# Patient Record
Sex: Male | Born: 1955 | Race: White | Hispanic: No | Marital: Single | Smoking: Current some day smoker
Health system: Southern US, Community
[De-identification: ages and names within clinical notes are randomized; demographics above are authoritative.]

## PROBLEM LIST (undated history)

## (undated) DIAGNOSIS — G4700 Insomnia, unspecified: Secondary | ICD-10-CM

## (undated) DIAGNOSIS — M1990 Unspecified osteoarthritis, unspecified site: Secondary | ICD-10-CM

## (undated) DIAGNOSIS — K219 Gastro-esophageal reflux disease without esophagitis: Secondary | ICD-10-CM

## (undated) DIAGNOSIS — R112 Nausea with vomiting, unspecified: Secondary | ICD-10-CM

## (undated) DIAGNOSIS — Z95810 Presence of automatic (implantable) cardiac defibrillator: Secondary | ICD-10-CM

## (undated) DIAGNOSIS — I4891 Unspecified atrial fibrillation: Secondary | ICD-10-CM

## (undated) DIAGNOSIS — E785 Hyperlipidemia, unspecified: Secondary | ICD-10-CM

## (undated) DIAGNOSIS — I442 Atrioventricular block, complete: Secondary | ICD-10-CM

## (undated) DIAGNOSIS — R011 Cardiac murmur, unspecified: Secondary | ICD-10-CM

## (undated) DIAGNOSIS — Z9889 Other specified postprocedural states: Secondary | ICD-10-CM

## (undated) DIAGNOSIS — Z8719 Personal history of other diseases of the digestive system: Secondary | ICD-10-CM

## (undated) DIAGNOSIS — R7303 Prediabetes: Secondary | ICD-10-CM

## (undated) DIAGNOSIS — I10 Essential (primary) hypertension: Secondary | ICD-10-CM

## (undated) DIAGNOSIS — F419 Anxiety disorder, unspecified: Secondary | ICD-10-CM

## (undated) HISTORY — DX: Anxiety disorder, unspecified: F41.9

## (undated) HISTORY — PX: OTHER SURGICAL HISTORY: SHX169

## (undated) HISTORY — DX: Atrioventricular block, complete: I44.2

## (undated) HISTORY — DX: Unspecified atrial fibrillation: I48.91

## (undated) HISTORY — DX: Presence of automatic (implantable) cardiac defibrillator: Z95.810

## (undated) HISTORY — DX: Cardiac murmur, unspecified: R01.1

## (undated) HISTORY — DX: Insomnia, unspecified: G47.00

## (undated) HISTORY — PX: HIP SURGERY: SHX245

## (undated) HISTORY — PX: HERNIA REPAIR: SHX51

## (undated) HISTORY — DX: Hyperlipidemia, unspecified: E78.5

---

## 2004-11-01 HISTORY — PX: CARDIOVERSION: SHX1299

## 2005-05-21 ENCOUNTER — Ambulatory Visit (HOSPITAL_COMMUNITY): Admission: RE | Admit: 2005-05-21 | Discharge: 2005-05-21 | Payer: Self-pay | Admitting: Cardiology

## 2006-05-20 ENCOUNTER — Inpatient Hospital Stay (HOSPITAL_COMMUNITY): Admission: EM | Admit: 2006-05-20 | Discharge: 2006-05-25 | Payer: Self-pay | Admitting: Emergency Medicine

## 2006-05-20 ENCOUNTER — Encounter (INDEPENDENT_AMBULATORY_CARE_PROVIDER_SITE_OTHER): Payer: Self-pay | Admitting: Cardiology

## 2006-05-20 ENCOUNTER — Ambulatory Visit: Payer: Self-pay | Admitting: Internal Medicine

## 2006-06-16 ENCOUNTER — Ambulatory Visit: Payer: Self-pay

## 2006-10-28 ENCOUNTER — Ambulatory Visit: Payer: Self-pay | Admitting: Internal Medicine

## 2007-03-28 ENCOUNTER — Ambulatory Visit: Payer: Self-pay | Admitting: Internal Medicine

## 2007-09-17 IMAGING — CR DG CHEST 1V PORT
1 series · 1 of 1 positions shown · non-contrast
Comparison: None available.

CLINICAL DATA: Chest pain.  Shortness of breath.  Syncope.  History of cardiomyopathy. 
 PORTABLE CHEST - 1 VIEW 05/20/06:

[view not recorded]
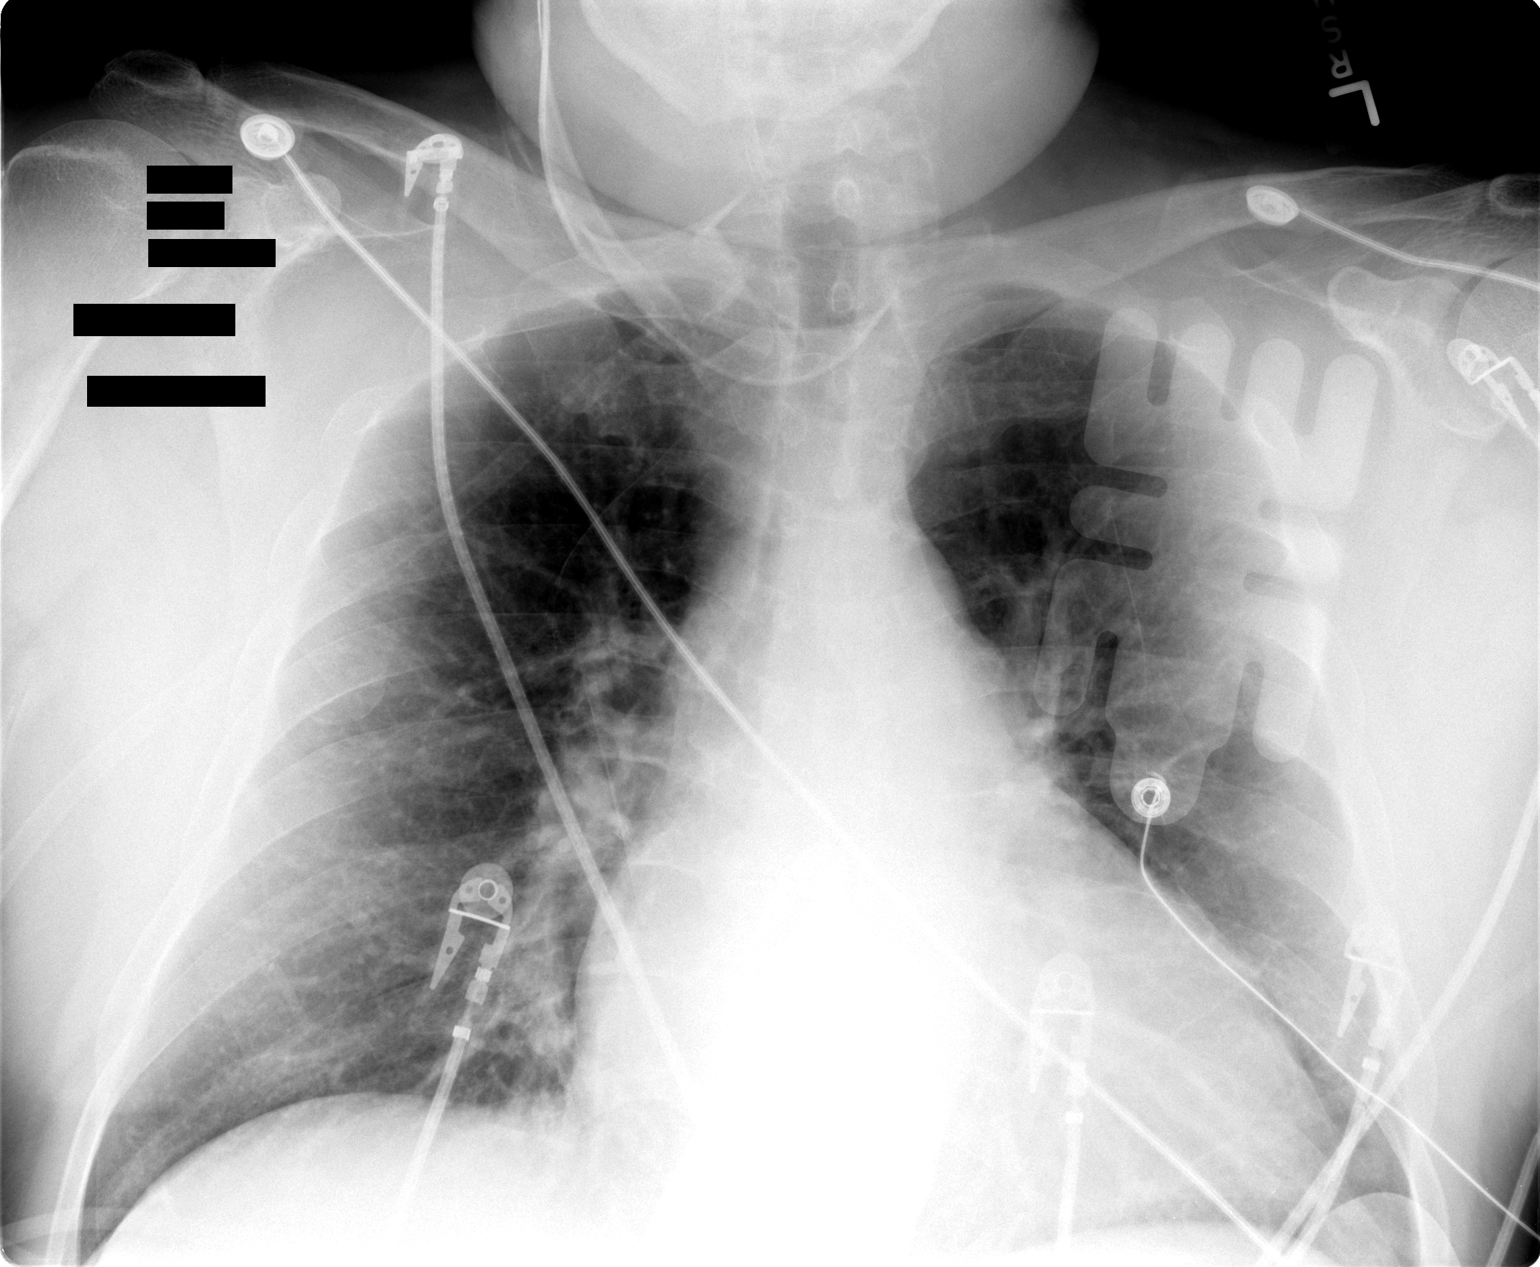

[1 of 1 positions shown; findings below may reference images not displayed]

FINDINGS: Mild to moderate cardiomegaly is seen.  Both lungs are clear. There is no evidence of pleural effusion.
IMPRESSION: Cardiomegaly.  No active lung disease.

## 2007-12-20 ENCOUNTER — Ambulatory Visit: Payer: Self-pay | Admitting: Internal Medicine

## 2007-12-20 LAB — CONVERTED CEMR LAB
Basophils Absolute: 0 10*3/uL (ref 0.0–0.1)
Basophils Relative: 0.2 % (ref 0.0–1.0)
Eosinophils Absolute: 0.3 10*3/uL (ref 0.0–0.6)
Eosinophils Relative: 3.6 % (ref 0.0–5.0)
Hemoglobin: 17 g/dL (ref 13.0–17.0)
Lymphocytes Relative: 29.4 % (ref 12.0–46.0)
MCHC: 33.7 g/dL (ref 30.0–36.0)
MCV: 96.4 fL (ref 78.0–100.0)
Magnesium: 2.1 mg/dL (ref 1.5–2.5)
Neutrophils Relative %: 55 % (ref 43.0–77.0)
RBC: 5.22 M/uL (ref 4.22–5.81)
RDW: 12.8 % (ref 11.5–14.6)
TSH: 2.62 microintl units/mL (ref 0.35–5.50)
WBC: 8.5 10*3/uL (ref 4.5–10.5)

## 2007-12-25 ENCOUNTER — Ambulatory Visit: Payer: Self-pay | Admitting: Cardiology

## 2008-01-04 ENCOUNTER — Ambulatory Visit: Payer: Self-pay | Admitting: Cardiovascular Disease

## 2008-01-12 ENCOUNTER — Ambulatory Visit: Payer: Self-pay | Admitting: Internal Medicine

## 2008-01-19 ENCOUNTER — Ambulatory Visit: Payer: Self-pay | Admitting: Cardiovascular Disease

## 2008-01-25 ENCOUNTER — Ambulatory Visit: Payer: Self-pay | Admitting: Internal Medicine

## 2008-01-26 ENCOUNTER — Ambulatory Visit: Payer: Self-pay | Admitting: Internal Medicine

## 2008-01-26 ENCOUNTER — Ambulatory Visit (HOSPITAL_COMMUNITY): Admission: RE | Admit: 2008-01-26 | Discharge: 2008-01-26 | Payer: Self-pay | Admitting: Internal Medicine

## 2008-01-31 ENCOUNTER — Ambulatory Visit: Payer: Self-pay | Admitting: Internal Medicine

## 2008-02-07 ENCOUNTER — Ambulatory Visit: Payer: Self-pay | Admitting: Cardiovascular Disease

## 2008-02-16 ENCOUNTER — Ambulatory Visit: Payer: Self-pay | Admitting: Internal Medicine

## 2008-02-23 ENCOUNTER — Ambulatory Visit: Payer: Self-pay | Admitting: Internal Medicine

## 2008-03-01 ENCOUNTER — Ambulatory Visit: Payer: Self-pay | Admitting: Internal Medicine

## 2008-03-07 ENCOUNTER — Ambulatory Visit: Payer: Self-pay | Admitting: Internal Medicine

## 2008-03-11 ENCOUNTER — Ambulatory Visit: Payer: Self-pay | Admitting: Internal Medicine

## 2008-03-11 LAB — CONVERTED CEMR LAB
BUN: 9 mg/dL (ref 6–23)
CO2: 30 meq/L (ref 19–32)
Calcium: 9.3 mg/dL (ref 8.4–10.5)
Chloride: 106 meq/L (ref 96–112)
Eosinophils Absolute: 0.3 10*3/uL (ref 0.0–0.7)
Glucose, Bld: 93 mg/dL (ref 70–99)
Lymphocytes Relative: 26.3 % (ref 12.0–46.0)
MCV: 97.4 fL (ref 78.0–100.0)
Monocytes Absolute: 0.9 10*3/uL (ref 0.1–1.0)
Monocytes Relative: 10.7 % (ref 3.0–12.0)
Neutro Abs: 5 10*3/uL (ref 1.4–7.7)
Platelets: 217 10*3/uL (ref 150–400)
Potassium: 4.1 meq/L (ref 3.5–5.1)
RDW: 13.1 % (ref 11.5–14.6)

## 2008-03-15 ENCOUNTER — Ambulatory Visit: Payer: Self-pay | Admitting: Cardiology

## 2008-03-18 ENCOUNTER — Ambulatory Visit (HOSPITAL_COMMUNITY): Admission: RE | Admit: 2008-03-18 | Discharge: 2008-03-18 | Payer: Self-pay | Admitting: Internal Medicine

## 2008-03-18 ENCOUNTER — Ambulatory Visit: Payer: Self-pay | Admitting: Internal Medicine

## 2008-04-03 ENCOUNTER — Ambulatory Visit: Payer: Self-pay | Admitting: Internal Medicine

## 2008-04-24 ENCOUNTER — Ambulatory Visit: Payer: Self-pay | Admitting: Internal Medicine

## 2008-05-02 ENCOUNTER — Ambulatory Visit: Payer: Self-pay | Admitting: Cardiology

## 2008-05-23 ENCOUNTER — Ambulatory Visit: Payer: Self-pay | Admitting: Internal Medicine

## 2008-06-03 ENCOUNTER — Ambulatory Visit: Payer: Self-pay | Admitting: Cardiology

## 2008-06-17 ENCOUNTER — Ambulatory Visit: Payer: Self-pay | Admitting: Cardiology

## 2008-07-01 ENCOUNTER — Ambulatory Visit: Payer: Self-pay | Admitting: Cardiology

## 2008-07-24 ENCOUNTER — Ambulatory Visit: Payer: Self-pay | Admitting: Cardiology

## 2008-07-27 ENCOUNTER — Emergency Department (HOSPITAL_BASED_OUTPATIENT_CLINIC_OR_DEPARTMENT_OTHER): Admission: EM | Admit: 2008-07-27 | Discharge: 2008-07-27 | Payer: Self-pay | Admitting: Internal Medicine

## 2008-07-30 ENCOUNTER — Ambulatory Visit: Payer: Self-pay | Admitting: Internal Medicine

## 2008-07-30 ENCOUNTER — Ambulatory Visit: Payer: Self-pay | Admitting: Cardiology

## 2008-08-15 ENCOUNTER — Emergency Department (HOSPITAL_BASED_OUTPATIENT_CLINIC_OR_DEPARTMENT_OTHER): Admission: EM | Admit: 2008-08-15 | Discharge: 2008-08-15 | Payer: Self-pay | Admitting: Emergency Medicine

## 2008-08-20 ENCOUNTER — Ambulatory Visit: Payer: Self-pay | Admitting: Internal Medicine

## 2008-09-17 ENCOUNTER — Ambulatory Visit: Payer: Self-pay | Admitting: Cardiology

## 2008-10-08 ENCOUNTER — Ambulatory Visit: Payer: Self-pay | Admitting: Cardiology

## 2008-10-22 ENCOUNTER — Ambulatory Visit: Payer: Self-pay | Admitting: Internal Medicine

## 2008-11-05 ENCOUNTER — Ambulatory Visit: Payer: Self-pay | Admitting: Cardiology

## 2008-11-22 ENCOUNTER — Ambulatory Visit: Payer: Self-pay | Admitting: Internal Medicine

## 2008-12-11 ENCOUNTER — Ambulatory Visit: Payer: Self-pay | Admitting: Cardiology

## 2008-12-11 DIAGNOSIS — I421 Obstructive hypertrophic cardiomyopathy: Secondary | ICD-10-CM

## 2008-12-20 ENCOUNTER — Ambulatory Visit: Payer: Self-pay | Admitting: Cardiovascular Disease

## 2009-01-07 ENCOUNTER — Ambulatory Visit: Payer: Self-pay | Admitting: Cardiology

## 2009-01-14 ENCOUNTER — Encounter: Payer: Self-pay | Admitting: Internal Medicine

## 2009-01-21 ENCOUNTER — Ambulatory Visit: Payer: Self-pay | Admitting: Cardiovascular Disease

## 2009-02-04 ENCOUNTER — Ambulatory Visit: Payer: Self-pay | Admitting: Cardiovascular Disease

## 2009-02-07 ENCOUNTER — Ambulatory Visit: Payer: Self-pay | Admitting: Cardiology

## 2009-02-12 ENCOUNTER — Telehealth (INDEPENDENT_AMBULATORY_CARE_PROVIDER_SITE_OTHER): Payer: Self-pay | Admitting: *Deleted

## 2009-02-17 ENCOUNTER — Ambulatory Visit: Payer: Self-pay | Admitting: Cardiovascular Disease

## 2009-03-04 ENCOUNTER — Ambulatory Visit: Payer: Self-pay | Admitting: Internal Medicine

## 2009-03-04 DIAGNOSIS — I442 Atrioventricular block, complete: Secondary | ICD-10-CM

## 2009-03-06 ENCOUNTER — Telehealth: Payer: Self-pay | Admitting: Internal Medicine

## 2009-03-10 ENCOUNTER — Telehealth: Payer: Self-pay | Admitting: Internal Medicine

## 2009-03-18 ENCOUNTER — Ambulatory Visit: Payer: Self-pay | Admitting: Internal Medicine

## 2009-04-01 ENCOUNTER — Encounter: Payer: Self-pay | Admitting: *Deleted

## 2009-04-08 ENCOUNTER — Ambulatory Visit: Payer: Self-pay | Admitting: Cardiology

## 2009-04-08 LAB — CONVERTED CEMR LAB: Protime: 16.7

## 2009-04-29 ENCOUNTER — Ambulatory Visit: Payer: Self-pay

## 2009-04-29 LAB — CONVERTED CEMR LAB: POC INR: 4.4

## 2009-05-07 ENCOUNTER — Encounter: Payer: Self-pay | Admitting: *Deleted

## 2009-05-26 ENCOUNTER — Ambulatory Visit: Payer: Self-pay | Admitting: Cardiology

## 2009-05-26 ENCOUNTER — Encounter (INDEPENDENT_AMBULATORY_CARE_PROVIDER_SITE_OTHER): Payer: Self-pay | Admitting: Cardiology

## 2009-05-26 LAB — CONVERTED CEMR LAB: Prothrombin Time: 21 s

## 2009-06-02 ENCOUNTER — Ambulatory Visit: Payer: Self-pay | Admitting: Internal Medicine

## 2009-06-10 ENCOUNTER — Encounter: Payer: Self-pay | Admitting: Internal Medicine

## 2009-06-21 ENCOUNTER — Emergency Department (HOSPITAL_BASED_OUTPATIENT_CLINIC_OR_DEPARTMENT_OTHER): Admission: EM | Admit: 2009-06-21 | Discharge: 2009-06-21 | Payer: Self-pay | Admitting: Emergency Medicine

## 2009-06-25 ENCOUNTER — Ambulatory Visit: Payer: Self-pay | Admitting: Internal Medicine

## 2009-07-18 ENCOUNTER — Ambulatory Visit: Payer: Self-pay | Admitting: Internal Medicine

## 2009-07-18 LAB — CONVERTED CEMR LAB: POC INR: 1.6

## 2009-08-15 ENCOUNTER — Ambulatory Visit: Payer: Self-pay | Admitting: Cardiology

## 2009-08-29 ENCOUNTER — Ambulatory Visit: Payer: Self-pay | Admitting: Radiology

## 2009-08-29 ENCOUNTER — Emergency Department (HOSPITAL_BASED_OUTPATIENT_CLINIC_OR_DEPARTMENT_OTHER): Admission: EM | Admit: 2009-08-29 | Discharge: 2009-08-29 | Payer: Self-pay | Admitting: Emergency Medicine

## 2009-08-29 ENCOUNTER — Ambulatory Visit: Payer: Self-pay | Admitting: Cardiovascular Disease

## 2009-08-29 LAB — CONVERTED CEMR LAB: POC INR: 2.5

## 2009-09-01 ENCOUNTER — Ambulatory Visit: Payer: Self-pay | Admitting: Internal Medicine

## 2009-09-05 ENCOUNTER — Telehealth: Payer: Self-pay | Admitting: Internal Medicine

## 2009-09-08 ENCOUNTER — Encounter: Payer: Self-pay | Admitting: Internal Medicine

## 2009-09-09 ENCOUNTER — Telehealth: Payer: Self-pay | Admitting: Internal Medicine

## 2009-09-23 ENCOUNTER — Ambulatory Visit: Payer: Self-pay | Admitting: Cardiovascular Disease

## 2009-09-23 LAB — CONVERTED CEMR LAB: POC INR: 4

## 2009-09-30 ENCOUNTER — Ambulatory Visit: Payer: Self-pay | Admitting: Cardiology

## 2009-09-30 ENCOUNTER — Ambulatory Visit: Payer: Self-pay | Admitting: Internal Medicine

## 2009-09-30 DIAGNOSIS — R030 Elevated blood-pressure reading, without diagnosis of hypertension: Secondary | ICD-10-CM | POA: Insufficient documentation

## 2009-10-02 ENCOUNTER — Ambulatory Visit: Payer: Self-pay

## 2009-10-02 ENCOUNTER — Ambulatory Visit: Payer: Self-pay | Admitting: Cardiology

## 2009-10-02 ENCOUNTER — Ambulatory Visit (HOSPITAL_COMMUNITY): Admission: RE | Admit: 2009-10-02 | Discharge: 2009-10-02 | Payer: Self-pay | Admitting: Internal Medicine

## 2009-10-02 ENCOUNTER — Encounter: Payer: Self-pay | Admitting: Internal Medicine

## 2009-10-07 ENCOUNTER — Inpatient Hospital Stay (HOSPITAL_COMMUNITY): Admission: RE | Admit: 2009-10-07 | Discharge: 2009-10-09 | Payer: Self-pay | Admitting: Orthopedic Surgery

## 2009-10-07 HISTORY — PX: JOINT REPLACEMENT: SHX530

## 2009-10-10 ENCOUNTER — Encounter: Payer: Self-pay | Admitting: Internal Medicine

## 2009-10-10 ENCOUNTER — Encounter: Payer: Self-pay | Admitting: Cardiology

## 2009-10-15 ENCOUNTER — Encounter: Payer: Self-pay | Admitting: Cardiology

## 2009-10-15 ENCOUNTER — Encounter (INDEPENDENT_AMBULATORY_CARE_PROVIDER_SITE_OTHER): Payer: Self-pay | Admitting: Cardiology

## 2009-10-15 LAB — CONVERTED CEMR LAB: POC INR: 1.7

## 2009-10-17 ENCOUNTER — Telehealth: Payer: Self-pay | Admitting: Internal Medicine

## 2009-10-22 ENCOUNTER — Encounter: Payer: Self-pay | Admitting: Internal Medicine

## 2009-10-22 LAB — CONVERTED CEMR LAB: POC INR: 1.6

## 2009-11-06 ENCOUNTER — Ambulatory Visit: Payer: Self-pay | Admitting: Cardiovascular Disease

## 2009-11-06 LAB — CONVERTED CEMR LAB: POC INR: 1.7

## 2009-11-20 ENCOUNTER — Ambulatory Visit: Payer: Self-pay | Admitting: Cardiology

## 2009-11-20 LAB — CONVERTED CEMR LAB: POC INR: 4.3

## 2009-12-03 ENCOUNTER — Telehealth: Payer: Self-pay | Admitting: Internal Medicine

## 2009-12-04 ENCOUNTER — Ambulatory Visit: Payer: Self-pay | Admitting: Cardiology

## 2009-12-04 LAB — CONVERTED CEMR LAB: POC INR: 3.4

## 2009-12-08 ENCOUNTER — Telehealth (INDEPENDENT_AMBULATORY_CARE_PROVIDER_SITE_OTHER): Payer: Self-pay | Admitting: *Deleted

## 2009-12-17 ENCOUNTER — Ambulatory Visit: Payer: Self-pay | Admitting: Internal Medicine

## 2009-12-31 ENCOUNTER — Ambulatory Visit: Payer: Self-pay | Admitting: Cardiology

## 2010-01-12 ENCOUNTER — Encounter: Payer: Self-pay | Admitting: Internal Medicine

## 2010-01-15 ENCOUNTER — Ambulatory Visit: Payer: Self-pay | Admitting: Internal Medicine

## 2010-01-27 ENCOUNTER — Encounter: Payer: Self-pay | Admitting: Internal Medicine

## 2010-01-28 ENCOUNTER — Ambulatory Visit: Payer: Self-pay | Admitting: Internal Medicine

## 2010-02-03 ENCOUNTER — Telehealth: Payer: Self-pay | Admitting: Internal Medicine

## 2010-02-25 ENCOUNTER — Ambulatory Visit: Payer: Self-pay | Admitting: Internal Medicine

## 2010-02-25 LAB — CONVERTED CEMR LAB: POC INR: 3.6

## 2010-03-18 ENCOUNTER — Ambulatory Visit: Payer: Self-pay | Admitting: Cardiology

## 2010-04-13 ENCOUNTER — Ambulatory Visit: Payer: Self-pay | Admitting: Internal Medicine

## 2010-04-14 ENCOUNTER — Encounter: Payer: Self-pay | Admitting: Internal Medicine

## 2010-04-15 ENCOUNTER — Ambulatory Visit: Payer: Self-pay | Admitting: Internal Medicine

## 2010-04-15 LAB — CONVERTED CEMR LAB: POC INR: 1.9

## 2010-05-13 ENCOUNTER — Ambulatory Visit: Payer: Self-pay | Admitting: Internal Medicine

## 2010-05-13 LAB — CONVERTED CEMR LAB: POC INR: 3.2

## 2010-05-15 ENCOUNTER — Encounter: Payer: Self-pay | Admitting: Internal Medicine

## 2010-06-03 ENCOUNTER — Ambulatory Visit: Payer: Self-pay | Admitting: Cardiology

## 2010-06-03 ENCOUNTER — Telehealth (INDEPENDENT_AMBULATORY_CARE_PROVIDER_SITE_OTHER): Payer: Self-pay | Admitting: *Deleted

## 2010-06-03 LAB — CONVERTED CEMR LAB: INR: 7.2

## 2010-06-10 ENCOUNTER — Ambulatory Visit: Payer: Self-pay | Admitting: Cardiology

## 2010-06-10 LAB — CONVERTED CEMR LAB: POC INR: 1.9

## 2010-07-08 ENCOUNTER — Ambulatory Visit: Payer: Self-pay | Admitting: Cardiovascular Disease

## 2010-07-08 LAB — CONVERTED CEMR LAB: POC INR: 4.2

## 2010-07-17 ENCOUNTER — Encounter: Payer: Self-pay | Admitting: Internal Medicine

## 2010-07-22 ENCOUNTER — Ambulatory Visit: Payer: Self-pay | Admitting: Cardiology

## 2010-07-22 LAB — CONVERTED CEMR LAB: POC INR: 3.8

## 2010-07-24 ENCOUNTER — Encounter: Payer: Self-pay | Admitting: Internal Medicine

## 2010-07-27 ENCOUNTER — Ambulatory Visit: Payer: Self-pay | Admitting: Internal Medicine

## 2010-08-05 ENCOUNTER — Ambulatory Visit: Payer: Self-pay | Admitting: Cardiovascular Disease

## 2010-09-02 ENCOUNTER — Ambulatory Visit: Payer: Self-pay | Admitting: Cardiology

## 2010-09-09 ENCOUNTER — Encounter: Payer: Self-pay | Admitting: Internal Medicine

## 2010-09-30 ENCOUNTER — Ambulatory Visit: Payer: Self-pay | Admitting: Internal Medicine

## 2010-10-22 ENCOUNTER — Encounter: Payer: Self-pay | Admitting: Cardiology

## 2010-10-22 ENCOUNTER — Encounter: Payer: Self-pay | Admitting: Internal Medicine

## 2010-10-22 ENCOUNTER — Ambulatory Visit: Payer: Self-pay | Admitting: Cardiology

## 2010-10-22 ENCOUNTER — Ambulatory Visit: Payer: Self-pay | Admitting: Internal Medicine

## 2010-10-22 LAB — CONVERTED CEMR LAB: POC INR: 2.9

## 2010-11-05 ENCOUNTER — Ambulatory Visit: Admit: 2010-11-05 | Payer: Self-pay | Admitting: Internal Medicine

## 2010-11-10 ENCOUNTER — Encounter (INDEPENDENT_AMBULATORY_CARE_PROVIDER_SITE_OTHER): Payer: Self-pay | Admitting: *Deleted

## 2010-11-19 ENCOUNTER — Ambulatory Visit: Admit: 2010-11-19 | Payer: Self-pay

## 2010-12-01 NOTE — Letter (Signed)
Summary: Remote Device Check  [employer], Main Office  [address]   [place], [place] [address]   Phone: [phone]  Fax: [phone]     January 27, 2010 MRN: [id number]   Charles Le [address] [place], [place]  [address]   Dear [name],   Your remote transmission was recieved and reviewed by your physician.  All diagnostics were within normal limits for you.  __X___Your next transmission is scheduled for:  April 13, 2010.  Please transmit at any time this day.  If you have a wireless device your transmission will be sent automatically.     Sincerely,  Proofreader

## 2010-12-01 NOTE — Medication Information (Signed)
Summary: rov/tm  Anticoagulant Therapy  Managed by: Bethena Midget, RN, BSN Referring MD: Linus Orn MD: Gala Romney MD, Reuel Boom Indication 1: Atrial Flutter (ICD-427.32) Lab Used: [facility] Site: [facility] INR POC 2.0 INR RANGE 2 - 3  Dietary changes: yes       Details: Appetite poor for past 10-12 days due to Bronchitis  Health status changes: yes       Details: Had Brochitis for past week, went to PCP it was viral not bacterial.   Bleeding/hemorrhagic complications: no     Any changes in medication regimen? yes       Details: Robutussin DM, Mucinex max strength  Recent/future dental: no  Any missed doses?: yes     Details: missed last Wednesdays dose  Is patient compliant with meds? yes       Allergies: No Known Drug Allergies  Anticoagulation Management History:      The patient is taking warfarin and comes in today for a routine follow up visit.  Negative risk factors for bleeding include an age less than 97 years old.  The bleeding index is 'low risk'.  Negative CHADS2 values include Age > 41 years old.  The start date was 12/26/2007.  His last INR was 12.0 ratio.  Anticoagulation responsible provider: Fady Stamps MD, Reuel Boom.  INR POC: 2.0.  Cuvette Lot#: [id number].  Exp: 03/2011.    Anticoagulation Management Assessment/Plan:      The patient's current anticoagulation dose is Warfarin sodium 5 mg tabs: Use as directed by Anticoagulation Clinic.  The target INR is 2 - 3.  The next INR is due 02/25/2010.  Anticoagulation instructions were given to patient.  Results were reviewed/authorized by Bethena Midget, RN, BSN.  He was notified by Bethena Midget, RN, BSN.         Prior Anticoagulation Instructions: INR 2.8 Continue 5mg s everyday except 7.5mg s Mondays, Wednesdays and Fridays. Recheck in 4 weeks.   Current Anticoagulation Instructions: INR 2.0 Continue 5mg s daily excecpt 7.5mg s on Mondays, Wednesdays and Fridays. Recheck in 4 weeks.

## 2010-12-01 NOTE — Letter (Signed)
Summary: Charles Le   Imported By: Marylou Mccoy 11/11/2009 14:02:14  _____________________________________________________________________  External Attachment:    Type:   Image     Comment:   External Document

## 2010-12-01 NOTE — Cardiovascular Report (Signed)
Summary: Office Visit Remote   Office Visit Remote   Imported By: Roderic Ovens 05/18/2010 12:34:07  _____________________________________________________________________  External Attachment:    Type:   Image     Comment:   External Document

## 2010-12-01 NOTE — Letter (Signed)
Summary: Remote Device Check  [employer], Main Office  [address]   [place], [place] [address]   Phone: [phone]  Fax: [phone]     May 15, 2010 MRN: [id number]   QUINTAVIUS NIEBUHR [address] [place], [place]  [address]   Dear Mr. MCFAYDEN,   Your remote transmission was recieved and reviewed by your physician.  All diagnostics were within normal limits for you.  __X___Your next transmission is scheduled for:  07-16-2010.  Please transmit at any time this day.  If you have a wireless device your transmission will be sent automatically.    Sincerely,  Vella Kohler

## 2010-12-01 NOTE — Letter (Signed)
Summary: Device-Delinquent Phone [employer], [employer], Main Office  [address]   [place], [place] [address]   Phone: [phone]  Fax: [phone]     January 12, 2010 MRN: [id number]   OGDEN HANDLIN [address] [place], [place]  [address]   Dear Mr. ELAMIN,  According to our records, you were scheduled for a device phone transmission on  January 05, 2010.     We did not receive any results from this check.  If you transmitted on your scheduled day, please call us to help troubleshoot your system.  If you forgot to send your transmission, please send one upon receipt of this letter.  Thank you,   Architectural technologist [facility]

## 2010-12-01 NOTE — Medication Information (Signed)
Summary: rov/mw  Anticoagulant Therapy  Managed by: Weston Brass, PharmD Referring MD: Linus Orn MD: Gala Romney MD, Reuel Boom Indication 1: Atrial Flutter (ICD-427.32) Lab Used: [facility] Site: [facility] INR POC 2.1 INR RANGE 2 - 3  Dietary changes: no    Health status changes: no    Bleeding/hemorrhagic complications: yes       Details: Had a couple nose bleed  ~2 weeks ago w/ just spots of blood, happened last winter  Recent/future hospitalizations: no    Any changes in medication regimen? no    Recent/future dental: no  Any missed doses?: no       Is patient compliant with meds? yes       Allergies: No Known Drug Allergies  Anticoagulation Management History:      The patient is taking warfarin and comes in today for a routine follow up visit.  Negative risk factors for bleeding include an age less than 38 years old.  The bleeding index is 'low risk'.  Negative CHADS2 values include Age > 1 years old.  The start date was 12/26/2007.  His last INR was 7.2 ratio.  Anticoagulation responsible provider: Lynora Dymond MD, Reuel Boom.  INR POC: 2.1.  Cuvette Lot#: [id number].  Exp: 10/2011.    Anticoagulation Management Assessment/Plan:      The patient's current anticoagulation dose is Warfarin sodium 5 mg tabs: Use as directed by Anticoagulation Clinic.  The target INR is 2 - 3.  The next INR is due 10/21/2010.  Anticoagulation instructions were given to patient.  Results were reviewed/authorized by Weston Brass, PharmD.  He was notified by Hoy Register, PharmD Candidate.         Prior Anticoagulation Instructions: INR 2.9 Continue taking 1 tablet everyday. Recheck in 4 weeks.  Current Anticoagulation Instructions: INR 2.1 Continue previous dose of 1 tablet everyday Recheck INR in 3 weeks

## 2010-12-01 NOTE — Letter (Signed)
Summary: Device-Delinquent Phone Journalist, newspaper, Main Office  [address]   [place], [place] [address]   Phone: [phone]  Fax: [phone]     July 17, 2010 MRN: [id number]   Charles Le [address] [place], [place]  [address]   Dear [name],  According to our records, you were scheduled for a device phone transmission on 07-16-2010.    We did not receive any results from this check.  If you transmitted on your scheduled day, please call us to help troubleshoot your system.  If you forgot to send your transmission, please send one upon receipt of this letter.  Thank you,   Architectural technologist [facility]

## 2010-12-01 NOTE — Medication Information (Signed)
Summary: ccr  Anticoagulant Therapy  Managed by: Bethena Midget, RN, BSN Referring MD: Linus Orn MD: Riley Kill MD, Maisie Fus Indication 1: Atrial Flutter (ICD-427.32) Lab Used: [facility] Site: [facility] INR POC 4.3 INR RANGE 2 - 3  Dietary changes: yes       Details: Eating less green leafy vegetables  Health status changes: no    Bleeding/hemorrhagic complications: no    Recent/future hospitalizations: no    Any changes in medication regimen? no    Recent/future dental: no  Any missed doses?: no       Is patient compliant with meds? yes       Allergies: No Known Drug Allergies  Anticoagulation Management History:      The patient is taking warfarin and comes in today for a routine follow up visit.  Negative risk factors for bleeding include an age less than 87 years old.  The bleeding index is 'low risk'.  Negative CHADS2 values include Age > 105 years old.  The start date was 12/26/2007.  His last INR was 12.0 ratio.  Anticoagulation responsible provider: Riley Kill MD, Maisie Fus.  INR POC: 4.3.  Exp: 12/2010.    Anticoagulation Management Assessment/Plan:      The patient's current anticoagulation dose is Warfarin sodium 5 mg tabs: Use as directed by Anticoagulation Clinic.  The target INR is 2 - 3.  The next INR is due 12/04/2009.  Anticoagulation instructions were given to patient.  Results were reviewed/authorized by Bethena Midget, RN, BSN.  He was notified by Bethena Midget, RN, BSN.         Prior Anticoagulation Instructions: INR 1.7  Take 2 tablets today then increase dose to 1.5 tablets every day except 1 tablet on Tuesdays and Thursdays. Recheck on 1/17.  Current Anticoagulation Instructions: INR 4.3 Skip Fridays dose then change dose to 7.5mg s everyday except 5mg s on Tuesdays, Thursdays and Saturdays. Recheck in 2 weeks.  Prescriptions: WARFARIN SODIUM 5 MG TABS (WARFARIN SODIUM) Use as directed by Anticoagulation Clinic  #120 x 1   Entered by:   Bethena Midget,  RN, BSN   Authorized by:   Nathen May, MD, [facility]   Signed by:   Bethena Midget, RN, BSN on 11/20/2009   Method used:   Electronically to        [facility] [address] (retail)       [id number]       [place], [place]  [address]       Ph: [phone]       Fax: [phone]   RxID:   [id number]

## 2010-12-01 NOTE — Letter (Signed)
Summary: Remote Device Check  [employer], Main Office  [address]   [place], [place] [address]   Phone: [phone]  Fax: [phone]     September 09, 2010 MRN: [id number]   SEICHI KAUFHOLD [address] [place], [place]  [address]   Dear Mr. MAKAREWICZ,   Your remote transmission was recieved and reviewed by your physician.  All diagnostics were within normal limits for you.  __X___Your next transmission is scheduled for: 11-05-2010.  Please transmit at any time this day.  If you have a wireless device your transmission will be sent automatically.   Sincerely,  Vella Kohler

## 2010-12-01 NOTE — Medication Information (Signed)
Summary: rov/sp  Anticoagulant Therapy  Managed by: Eda Keys, PharmD Referring MD: Linus Orn MD: Jens Som MD, Arlys John Indication 1: Atrial Flutter (ICD-427.32) Lab Used: [facility] Site: [facility] INR POC 2.4 INR RANGE 2 - 3  Dietary changes: no    Health status changes: no    Bleeding/hemorrhagic complications: no    Recent/future hospitalizations: no    Any changes in medication regimen? no    Recent/future dental: no  Any missed doses?: no       Is patient compliant with meds? yes       Allergies: No Known Drug Allergies  Anticoagulation Management History:      The patient is taking warfarin and comes in today for a routine follow up visit.  Negative risk factors for bleeding include an age less than 103 years old.  The bleeding index is 'low risk'.  Negative CHADS2 values include Age > 12 years old.  The start date was 12/26/2007.  His last INR was 12.0 ratio.  Anticoagulation responsible provider: Jens Som MD, Arlys John.  INR POC: 2.4.  Cuvette Lot#: [id number].  Exp: 06/2011.    Anticoagulation Management Assessment/Plan:      The patient's current anticoagulation dose is Warfarin sodium 5 mg tabs: Use as directed by Anticoagulation Clinic.  The target INR is 2 - 3.  The next INR is due 04/15/2010.  Anticoagulation instructions were given to patient.  Results were reviewed/authorized by Eda Keys, PharmD.  He was notified by Eda Keys.         Prior Anticoagulation Instructions: INR 3.6  Skip today's dose of Coumadin then resume same dose of 1 tablet every day except 1 1/2 tablets on Monday, Wednesday and Friday   Current Anticoagulation Instructions: INR 2.4  Continue taking 1.5 tablets on Monday, Wednesday, and Friday and 1 tablet all other dyas.  Return to clinic in 4 weeks.

## 2010-12-01 NOTE — Medication Information (Signed)
Summary: rov/sl  Anticoagulant Therapy  Managed by: Lyna Poser, PharmD Referring MD: Linus Orn MD: Riley Kill MD, Maisie Fus Indication 1: Atrial Flutter (ICD-427.32) Lab Used: [facility] Site: [facility] INR POC 2.9 INR RANGE 2 - 3  Dietary changes: no    Health status changes: no    Bleeding/hemorrhagic complications: no    Recent/future hospitalizations: no    Any changes in medication regimen? no    Recent/future dental: no  Any missed doses?: no       Is patient compliant with meds? yes       Allergies: No Known Drug Allergies  Anticoagulation Management History:      The patient is taking warfarin and comes in today for a routine follow up visit.  Negative risk factors for bleeding include an age less than 92 years old.  The bleeding index is 'low risk'.  Negative CHADS2 values include Age > 45 years old.  The start date was 12/26/2007.  His last INR was 7.2 ratio.  Anticoagulation responsible [place] First: Riley Kill MD, Maisie Fus.  INR POC: 2.9.  Cuvette Lot#: [id number].  Exp: 08/2011.    Anticoagulation Management Assessment/Plan:      The patient's current anticoagulation dose is Warfarin sodium 5 mg tabs: Use as directed by Anticoagulation Clinic.  The target INR is 2 - 3.  The next INR is due 09/30/2010.  Anticoagulation instructions were given to patient.  Results were reviewed/authorized by Lyna Poser, PharmD.         Prior Anticoagulation Instructions: INR 3.9  Today, October 5th, do not take Coumadin. Then, start taking Coumadin 1 tab (5 mg) every day.  Return to clinic in 2 weeks.    Current Anticoagulation Instructions: INR 2.9 Continue taking 1 tablet everyday. Recheck in 4 weeks.

## 2010-12-01 NOTE — Medication Information (Signed)
Summary: rov/sp  Anticoagulant Therapy  Managed by: Bethena Midget, RN, BSN Referring MD: Linus Orn MD: Jens Som MD, Arlys John Indication 1: Atrial Flutter (ICD-427.32) Lab Used: [facility] Site: [facility] INR POC 1.9 INR RANGE 2 - 3  Dietary changes: no    Health status changes: no    Bleeding/hemorrhagic complications: no    Recent/future hospitalizations: no    Any changes in medication regimen? no    Recent/future dental: no  Any missed doses?: no       Is patient compliant with meds? yes       Allergies: No Known Drug Allergies  Anticoagulation Management History:      The patient is taking warfarin and comes in today for a routine follow up visit.  Negative risk factors for bleeding include an age less than 47 years old.  The bleeding index is 'low risk'.  Negative CHADS2 values include Age > 91 years old.  The start date was 12/26/2007.  His last INR was 7.2 ratio.  Anticoagulation responsible provider: Jens Som MD, Arlys John.  INR POC: 1.9.  Cuvette Lot#: [id number].  Exp: 08/2011.    Anticoagulation Management Assessment/Plan:      The patient's current anticoagulation dose is Warfarin sodium 5 mg tabs: Use as directed by Anticoagulation Clinic.  The target INR is 2 - 3.  The next INR is due 06/24/2010.  Anticoagulation instructions were given to patient.  Results were reviewed/authorized by Bethena Midget, RN, BSN.  He was notified by Bethena Midget, RN, BSN.         Prior Anticoagulation Instructions: INR 6.7 Hold coumadin until we call.   From lab: 7.2  Skip today's dose, Thurs and Friday dose. Then change dose to 5mg s daily except 7.5mg s on Mondays and Fridays. Recheck in one week. Instructions given to Fiance'.   Current Anticoagulation Instructions: INR 1.9 Change dose to 1 pill everyday except 1.5 pills on Mondays, Wednesdays and Fridays. Recheck in 2 weeks.

## 2010-12-01 NOTE — Medication Information (Signed)
Summary: rov/tm  Anticoagulant Therapy  Managed by: Weston Brass, PharmD Referring MD: Linus Orn MD: Johney Frame MD, Fayrene Fearing Indication 1: Atrial Flutter (ICD-427.32) Lab Used: [facility] Site: [facility] INR POC 3.2 INR RANGE 2 - 3  Dietary changes: no    Health status changes: no    Bleeding/hemorrhagic complications: no    Recent/future hospitalizations: no    Any changes in medication regimen? no    Recent/future dental: no  Any missed doses?: no       Is patient compliant with meds? yes       Allergies: No Known Drug Allergies  Anticoagulation Management History:      The patient is taking warfarin and comes in today for a routine follow up visit.  Negative risk factors for bleeding include an age less than 45 years old.  The bleeding index is 'low risk'.  Negative CHADS2 values include Age > 51 years old.  The start date was 12/26/2007.  His last INR was 12.0 ratio.  Anticoagulation responsible provider: Carolena Fairbank MD, Fayrene Fearing.  INR POC: 3.2.  Cuvette Lot#: [id number].  Exp: 07/2011.    Anticoagulation Management Assessment/Plan:      The patient's current anticoagulation dose is Warfarin sodium 5 mg tabs: Use as directed by Anticoagulation Clinic.  The target INR is 2 - 3.  The next INR is due 06/03/2010.  Anticoagulation instructions were given to patient.  Results were reviewed/authorized by Weston Brass, PharmD.  He was notified by Dillard Cannon.         Prior Anticoagulation Instructions: INR 1.9 Today take 2 pills then resume 1 pill everyday except 1 1/2 pills on Mondays, Wednesdays and Fridays. Recheck in 4 weeks.   Current Anticoagulation Instructions: INR 3.2  Hold coumadin today.  Then resume 1.5 tabs on Monday, Wednesday, Friday and 1 tab on Sunday, Tuesday, Thursday, and Saturday.  Re-check INR in 3 weeks.

## 2010-12-01 NOTE — Medication Information (Signed)
Summary: CCR  Anticoagulant Therapy  Managed by: Weston Brass, PharmD Referring MD: Linus Orn MD: Juanda Chance MD, Bruce Indication 1: Atrial Flutter (ICD-427.32) Lab Used: [facility] Site: [facility] INR POC 3.8 INR RANGE 2 - 3  Dietary changes: no    Health status changes: no    Bleeding/hemorrhagic complications: no    Recent/future hospitalizations: no    Any changes in medication regimen? no    Recent/future dental: no  Any missed doses?: no       Is patient compliant with meds? yes       Allergies: No Known Drug Allergies  Anticoagulation Management History:      The patient is taking warfarin and comes in today for a routine follow up visit.  Negative risk factors for bleeding include an age less than 7 years old.  The bleeding index is 'low risk'.  Negative CHADS2 values include Age > 65 years old.  The start date was 12/26/2007.  His last INR was 7.2 ratio.  Anticoagulation responsible provider: Juanda Chance MD, Smitty Cords.  INR POC: 3.8.  Exp: 08/2011.    Anticoagulation Management Assessment/Plan:      The patient's current anticoagulation dose is Warfarin sodium 5 mg tabs: Use as directed by Anticoagulation Clinic.  The target INR is 2 - 3.  The next INR is due 08/05/2010.  Anticoagulation instructions were given to patient.  Results were reviewed/authorized by Weston Brass, PharmD.  He was notified by Weston Brass PharmD.         Prior Anticoagulation Instructions: INR 4.2  Do not take today's dose, then take one tablet every day except for one and one-half tablets on Monday and Friday.  We will see you in two weeks.      Current Anticoagulation Instructions: INR 3.8  Skip today's dose of Coumadin then decrease dose to 1 tablet every day except 1 1/2 tablets on Monday.  Recheck INR in 2 weeks.

## 2010-12-01 NOTE — Medication Information (Signed)
Summary: rov/jm  Anticoagulant Therapy  Managed by: Reina Fuse, PharmD Referring MD: Linus Orn MD: Clifton Rozelle MD, Cristal Deer Indication 1: Atrial Flutter (ICD-427.32) Lab Used: [facility] Site: [facility] INR POC 3.9 INR RANGE 2 - 3  Dietary changes: no    Health status changes: no    Bleeding/hemorrhagic complications: no    Recent/future hospitalizations: no    Any changes in medication regimen? no    Recent/future dental: no  Any missed doses?: no       Is patient compliant with meds? yes       Current Medications (verified): 1)  Atenolol 50 Mg Tabs (Atenolol) .... Take 1 Tablet By Mouth Once A Day 2)  Warfarin Sodium 5 Mg Tabs (Warfarin Sodium) .... Use As Directed By Anticoagulation Clinic 3)  Nexium 40 Mg Cpdr (Esomeprazole Magnesium) .... Take One Tablet  Once Daily 4)  Ambien 10 Mg Tabs (Zolpidem Tartrate) .... Take 1 At Bedtime. 5)  Percocet 5-325 Mg Tabs (Oxycodone-Acetaminophen) .... Take 1-2 Every 4-6 Hours. 6)  Methocarbamol 500 Mg Tabs (Methocarbamol) .... Take 1 Every 6 Hours. 7)  Alprazolam 0.5 Mg Tabs (Alprazolam) .... Take 1 Every 8 Hours.  Allergies (verified): No Known Drug Allergies  Anticoagulation Management History:      The patient is taking warfarin and comes in today for a routine follow up visit.  Negative risk factors for bleeding include an age less than 34 years old.  The bleeding index is 'low risk'.  Negative CHADS2 values include Age > 50 years old.  The start date was 12/26/2007.  His last INR was 7.2 ratio.  Anticoagulation responsible An Schnabel: Clifton Bahena MD, Cristal Deer.  INR POC: 3.9.  Cuvette Lot#: [id number].  Exp: 08/2011.    Anticoagulation Management Assessment/Plan:      The patient's current anticoagulation dose is Warfarin sodium 5 mg tabs: Use as directed by Anticoagulation Clinic.  The target INR is 2 - 3.  The next INR is due 08/26/2010.  Anticoagulation instructions were given to patient.  Results were  reviewed/authorized by Reina Fuse, PharmD.  He was notified by Reina Fuse PharmD.         Prior Anticoagulation Instructions: INR 3.8  Skip today's dose of Coumadin then decrease dose to 1 tablet every day except 1 1/2 tablets on Monday.  Recheck INR in 2 weeks.   Current Anticoagulation Instructions: INR 3.9  Today, October 5th, do not take Coumadin. Then, start taking Coumadin 1 tab (5 mg) every day.  Return to clinic in 2 weeks.

## 2010-12-01 NOTE — Cardiovascular Report (Signed)
Summary: Office Visit Remote   Office Visit Remote   Imported By: Roderic Ovens 01/29/2010 11:53:25  _____________________________________________________________________  External Attachment:    Type:   Image     Comment:   External Document

## 2010-12-01 NOTE — Progress Notes (Signed)
Summary: REFILL  Phone Note Refill Request Message from:  Patient on December 03, 2009 3:30 PM  Refills Requested: Medication #1:  NEXIUM 40 MG CPDR TAKE ONE TABLET  once daily [facility] [phone] PT IS OUT OF MEDICATION NEED TODAY.  Initial call taken by: Judie Grieve,  December 03, 2009 3:31 PM  Follow-up for Phone Call       Follow-up by: Judithe Modest CMA,  December 03, 2009 4:35 PM    Prescriptions: NEXIUM 40 MG CPDR (ESOMEPRAZOLE MAGNESIUM) TAKE ONE TABLET  once daily  #30 Each x 2   Entered by:   Judithe Modest CMA   Authorized by:   Nathen May, MD, [facility]   Signed by:   Judithe Modest CMA on 12/03/2009   Method used:   Electronically to        [facility] [address] (retail)       [phone]       [place], [place]  [address]       Ph: [phone]       Fax: [phone]   RxID:   [id number]

## 2010-12-01 NOTE — Medication Information (Signed)
Summary: rov/ln  Anticoagulant Therapy  Managed by: Reina Fuse, PharmD Referring MD: Linus Orn MD: Riley Kill MD, Maisie Fus Indication 1: Atrial Flutter (ICD-427.32) Lab Used: [facility] Site: [facility] PT 77.6 INR POC 6.7 INR RANGE 2 - 3  Dietary changes: no    Health status changes: no    Bleeding/hemorrhagic complications: no    Recent/future hospitalizations: no    Any changes in medication regimen? no    Recent/future dental: no  Any missed doses?: no       Is patient compliant with meds? yes       Current Medications (verified): 1)  Atenolol 50 Mg Tabs (Atenolol) .... Take 1 Tablet By Mouth Once A Day 2)  Warfarin Sodium 5 Mg Tabs (Warfarin Sodium) .... Use As Directed By Anticoagulation Clinic 3)  Nexium 40 Mg Cpdr (Esomeprazole Magnesium) .... Take One Tablet  Once Daily 4)  Ambien 10 Mg Tabs (Zolpidem Tartrate) .... Take 1 At Bedtime. 5)  Percocet 5-325 Mg Tabs (Oxycodone-Acetaminophen) .... Take 1-2 Every 4-6 Hours. 6)  Methocarbamol 500 Mg Tabs (Methocarbamol) .... Take 1 Every 6 Hours. 7)  Alprazolam 0.5 Mg Tabs (Alprazolam) .... Take 1 Every 8 Hours.  Allergies (verified): No Known Drug Allergies  Anticoagulation Management History:      The patient is taking warfarin and comes in today for a routine follow up visit.  Negative risk factors for bleeding include an age less than 77 years old.  The bleeding index is 'low risk'.  Negative CHADS2 values include Age > 10 years old.  The start date was 12/26/2007.  His last INR was 12.0 ratio and today's INR is 7.2.  Prothrombin time is 77.6.  Anticoagulation responsible provider: Riley Kill MD, Maisie Fus.  INR POC: 6.7.  Cuvette Lot#: [id number].  Exp: 07/2011.    Anticoagulation Management Assessment/Plan:      The patient's current anticoagulation dose is Warfarin sodium 5 mg tabs: Use as directed by Anticoagulation Clinic.  The target INR is 2 - 3.  The next INR is due 06/10/2010.  Anticoagulation instructions  were given to patient.  Results were reviewed/authorized by Reina Fuse, PharmD.  He was notified by Bethena Midget, RN, BSN.         Prior Anticoagulation Instructions: INR 3.2  Hold coumadin today.  Then resume 1.5 tabs on Monday, Wednesday, Friday and 1 tab on Sunday, Tuesday, Thursday, and Saturday.  Re-check INR in 3 weeks.  Current Anticoagulation Instructions: INR 6.7 Hold coumadin until we call.   From lab: 7.2  Skip today's dose, Thurs and Friday dose. Then change dose to 5mg s daily except 7.5mg s on Mondays and Fridays. Recheck in one week. Instructions given to Fiance'.

## 2010-12-01 NOTE — Medication Information (Signed)
Summary: rov/tm  Anticoagulant Therapy  Managed by: Weston Brass, PharmD Referring MD: Linus Orn MD: Gala Romney MD, Reuel Boom Indication 1: Atrial Flutter (ICD-427.32) Lab Used: [facility] Site: [facility] INR POC 3.6 INR RANGE 2 - 3  Dietary changes: no    Health status changes: no    Bleeding/hemorrhagic complications: no    Recent/future hospitalizations: no    Any changes in medication regimen? no    Recent/future dental: no  Any missed doses?: no       Is patient compliant with meds? yes       Allergies: No Known Drug Allergies  Anticoagulation Management History:      The patient is taking warfarin and comes in today for a routine follow up visit.  Negative risk factors for bleeding include an age less than 46 years old.  The bleeding index is 'low risk'.  Negative CHADS2 values include Age > 98 years old.  The start date was 12/26/2007.  His last INR was 12.0 ratio.  Anticoagulation responsible provider: Bensimhon MD, Reuel Boom.  INR POC: 3.6.  Cuvette Lot#: [id number].  Exp: 04/2011.    Anticoagulation Management Assessment/Plan:      The patient's current anticoagulation dose is Warfarin sodium 5 mg tabs: Use as directed by Anticoagulation Clinic.  The target INR is 2 - 3.  The next INR is due 03/18/2010.  Anticoagulation instructions were given to patient.  Results were reviewed/authorized by Weston Brass, PharmD.  He was notified by Weston Brass PharmD.         Prior Anticoagulation Instructions: INR 2.0 Continue 5mg s daily excecpt 7.5mg s on Mondays, Wednesdays and Fridays. Recheck in 4 weeks.   Current Anticoagulation Instructions: INR 3.6  Skip today's dose of Coumadin then resume same dose of 1 tablet every day except 1 1/2 tablets on Monday, Wednesday and Friday

## 2010-12-01 NOTE — Medication Information (Signed)
Summary: rov/ewj  Anticoagulant Therapy  Managed by: Bethena Midget, RN, BSN Referring MD: Linus Orn MD: Johney Frame MD, Fayrene Fearing Indication 1: Atrial Flutter (ICD-427.32) Lab Used: [facility] Site: [facility] INR POC 2.2 INR RANGE 2 - 3  Dietary changes: no    Health status changes: no    Bleeding/hemorrhagic complications: no    Recent/future hospitalizations: no    Any changes in medication regimen? no    Recent/future dental: no  Any missed doses?: no       Is patient compliant with meds? yes       Allergies: No Known Drug Allergies  Anticoagulation Management History:      The patient is taking warfarin and comes in today for a routine follow up visit.  Negative risk factors for bleeding include an age less than 39 years old.  The bleeding index is 'low risk'.  Negative CHADS2 values include Age > 24 years old.  The start date was 12/26/2007.  His last INR was 12.0 ratio.  Anticoagulation responsible provider: Michio Thier MD, Fayrene Fearing.  INR POC: 2.2.  Cuvette Lot#: [id number].  Exp: 01/2011.    Anticoagulation Management Assessment/Plan:      The patient's current anticoagulation dose is Warfarin sodium 5 mg tabs: Use as directed by Anticoagulation Clinic.  The target INR is 2 - 3.  The next INR is due 12/31/2009.  Anticoagulation instructions were given to patient.  Results were reviewed/authorized by Bethena Midget, RN, BSN.  He was notified by Bethena Midget, RN, BSN.         Prior Anticoagulation Instructions: INR 3.4  Skip today's dose of coumadin then start taking 1 tablet daily except 1.5 tablets on Mondays, Wednesdays, and Fridays.  Recheck in  2 weeks.    Current Anticoagulation Instructions: INR 2.2 Continue 5mg s everyday except 7.5mg s on Mondays, Wednesdays and Fridays. Recheck in 2 weeks.

## 2010-12-01 NOTE — Progress Notes (Signed)
Summary: questions about what pt can eat  Phone Note Call from Patient Call back at Home Phone [phone]   Caller: Spouse/ lisa Summary of Call: Questions about what pt can eat for as Greens Initial call taken by: Judie Grieve,  June 03, 2010 3:28 PM  Follow-up for Phone Call        Vitamin K diet teaching done. Fiance' verbalizes that she now understands the importance of consistency.  Follow-up by: Bethena Midget, RN, BSN,  June 03, 2010 3:41 PM

## 2010-12-01 NOTE — Medication Information (Signed)
Summary: rov/tm  Anticoagulant Therapy  Managed by: Leota Sauers, PharmD Referring MD: Linus Orn MD: Excell Seltzer MD, Casimiro Needle Indication 1: Atrial Flutter (ICD-427.32) Lab Used: [facility] Site: [facility] INR POC 1.7 INR RANGE 2 - 3  Dietary changes: yes       Details: Ate extra greens on New Year's Day.  Health status changes: no    Bleeding/hemorrhagic complications: no    Recent/future hospitalizations: no     Recent/future dental: no  Any missed doses?: no       Is patient compliant with meds? yes       Current Medications (verified): 1)  Atenolol 50 Mg Tabs (Atenolol) .... Take 1 Tablet By Mouth Once A Day 2)  Warfarin Sodium 5 Mg Tabs (Warfarin Sodium) .... Use As Directed By Anticoagulation Clinic 3)  Nexium 40 Mg Cpdr (Esomeprazole Magnesium) .... Take One Tablet  Once Daily 4)  Ambien 10 Mg Tabs (Zolpidem Tartrate) .... Take 1 At Bedtime. 5)  Percocet 5-325 Mg Tabs (Oxycodone-Acetaminophen) .... Take 1-2 Every 4-6 Hours. 6)  Methocarbamol 500 Mg Tabs (Methocarbamol) .... Take 1 Every 6 Hours. 7)  Alprazolam 0.5 Mg Tabs (Alprazolam) .... Take 1 Every 8 Hours.  Allergies (verified): No Known Drug Allergies  Anticoagulation Management History:      The patient is taking warfarin and comes in today for a routine follow up visit.  Negative risk factors for bleeding include an age less than 44 years old.  The bleeding index is 'low risk'.  Negative CHADS2 values include Age > 57 years old.  The start date was 12/26/2007.  His last INR was 12.0 ratio.  Anticoagulation responsible provider: Excell Seltzer MD, Casimiro Needle.  INR POC: 1.7.  Cuvette Lot#: [id number].  Exp: 12/2010.    Anticoagulation Management Assessment/Plan:      The patient's current anticoagulation dose is Warfarin sodium 5 mg tabs: Use as directed by Anticoagulation Clinic.  The target INR is 2 - 3.  The next INR is due 11/17/2009.  Anticoagulation instructions were given to patient.  Results were  reviewed/authorized by Leota Sauers, PharmD.  He was notified by Lew Dawes, PharmD Candidate.         Prior Anticoagulation Instructions: LMOM Bethena Midget, RN, BSN  October 22, 2009 3:42 PM  Called spoke with pt.  Advised to take an extra 1/2 tablet today then start taking 1.5 tablets daily except 1 tablet on Sundays, Tuesdays, and Thursdays.  Recheck in 10-14 days.  Called spoke with Elnita Maxwell at [facility] gave dosage instructs and recheck orders.      Current Anticoagulation Instructions: INR 1.7  Take 2 tablets today then increase dose to 1.5 tablets every day except 1 tablet on Tuesdays and Thursdays. Recheck on 1/17.

## 2010-12-01 NOTE — Medication Information (Signed)
Summary: rov/ewj  Anticoagulant Therapy  Managed by: Cloyde Reams, RN, BSN Referring MD: Linus Orn MD: Juanda Chance MD, Dayane Hillenburg Indication 1: Atrial Flutter (ICD-427.32) Lab Used: [facility] Site: [facility] INR POC 3.4 INR RANGE 2 - 3  Dietary changes: no    Health status changes: no    Bleeding/hemorrhagic complications: no    Recent/future hospitalizations: no    Any changes in medication regimen? no    Recent/future dental: no  Any missed doses?: no       Is patient compliant with meds? yes       Allergies (verified): No Known Drug Allergies  Anticoagulation Management History:      The patient is taking warfarin and comes in today for a routine follow up visit.  Negative risk factors for bleeding include an age less than 32 years old.  The bleeding index is 'low risk'.  Negative CHADS2 values include Age > 46 years old.  The start date was 12/26/2007.  His last INR was 12.0 ratio.  Anticoagulation responsible provider: Juanda Chance MD, Smitty Cords.  INR POC: 3.4.  Cuvette Lot#: [id number].  Exp: 01/2011.    Anticoagulation Management Assessment/Plan:      The patient's current anticoagulation dose is Warfarin sodium 5 mg tabs: Use as directed by Anticoagulation Clinic.  The target INR is 2 - 3.  The next INR is due 12/18/2009.  Anticoagulation instructions were given to patient.  Results were reviewed/authorized by Cloyde Reams, RN, BSN.  He was notified by Cloyde Reams RN.         Prior Anticoagulation Instructions: INR 4.3 Skip Fridays dose then change dose to 7.5mg s everyday except 5mg s on Tuesdays, Thursdays and Saturdays. Recheck in 2 weeks.   Current Anticoagulation Instructions: INR 3.4  Skip today's dose of coumadin then start taking 1 tablet daily except 1.5 tablets on Mondays, Wednesdays, and Fridays.  Recheck in  2 weeks.

## 2010-12-01 NOTE — Medication Information (Signed)
Summary: rov/tm  Anticoagulant Therapy  Managed by: Bethena Midget, RN, BSN Referring MD: Linus Orn MD: Clifton Ashenfelter MD, Cristal Deer Indication 1: Atrial Flutter (ICD-427.32) Lab Used: [facility] Site: [facility] INR POC 4.2 INR RANGE 2 - 3  Dietary changes: no    Health status changes: no    Bleeding/hemorrhagic complications: no    Recent/future hospitalizations: no    Any changes in medication regimen? no    Recent/future dental: no  Any missed doses?: no       Is patient compliant with meds? yes       Allergies: No Known Drug Allergies  Anticoagulation Management History:      Negative risk factors for bleeding include an age less than 75 years old.  The bleeding index is 'low risk'.  Negative CHADS2 values include Age > 54 years old.  The start date was 12/26/2007.  His last INR was 7.2 ratio.  Anticoagulation responsible provider: Clifton Elza MD, Cristal Deer.  INR POC: 4.2.  Cuvette Lot#: [id number].  Exp: 08/2011.    Anticoagulation Management Assessment/Plan:      The patient's current anticoagulation dose is Warfarin sodium 5 mg tabs: Use as directed by Anticoagulation Clinic.  The target INR is 2 - 3.  The next INR is due 07/22/2010.  Anticoagulation instructions were given to patient.  Results were reviewed/authorized by Bethena Midget, RN, BSN.  He was notified by Kennieth Francois.         Prior Anticoagulation Instructions: INR 1.9 Change dose to 1 pill everyday except 1.5 pills on Mondays, Wednesdays and Fridays. Recheck in 2 weeks.   Current Anticoagulation Instructions: INR 4.2  Do not take today's dose, then take one tablet every day except for one and one-half tablets on Monday and Friday.  We will see you in two weeks.

## 2010-12-01 NOTE — Cardiovascular Report (Signed)
Summary: Office Visit Remote   Office Visit Remote   Imported By: Roderic Ovens 09/10/2010 15:47:14  _____________________________________________________________________  External Attachment:    Type:   Image     Comment:   External Document

## 2010-12-01 NOTE — Progress Notes (Signed)
Summary: device transmitter  Phone Note Call from Patient Call back at Home Phone [phone]   Caller: Spouse/Lisa Reason for Call: Talk to Nurse Summary of Call: request call back about device transmitter Initial call taken by: Migdalia Dk,  December 08, 2009 2:34 PM  Follow-up for Phone Call        The patient was confused about when his next remote check was.  He is scheduled for March 7th. Follow-up by: Altha Harm, LPN,  December 08, 2009 5:10 PM

## 2010-12-01 NOTE — Medication Information (Signed)
Summary: rov/tm  Anticoagulant Therapy  Managed by: Bethena Midget, RN, BSN Referring MD: Linus Orn MD: Riley Kill MD, Maisie Fus Indication 1: Atrial Flutter (ICD-427.32) Lab Used: [facility] Site: [facility] INR POC 2.8 INR RANGE 2 - 3  Dietary changes: no    Health status changes: no    Bleeding/hemorrhagic complications: no    Recent/future hospitalizations: no    Any changes in medication regimen? no    Recent/future dental: no  Any missed doses?: no       Is patient compliant with meds? yes       Allergies: No Known Drug Allergies  Anticoagulation Management History:      The patient is taking warfarin and comes in today for a routine follow up visit.  Negative risk factors for bleeding include an age less than 65 years old.  The bleeding index is 'low risk'.  Negative CHADS2 values include Age > 67 years old.  The start date was 12/26/2007.  His last INR was 12.0 ratio.  Anticoagulation responsible provider: Riley Kill MD, Maisie Fus.  INR POC: 2.8.  Cuvette Lot#: [id number].  Exp: 03/2011.    Anticoagulation Management Assessment/Plan:      The patient's current anticoagulation dose is Warfarin sodium 5 mg tabs: Use as directed by Anticoagulation Clinic.  The target INR is 2 - 3.  The next INR is due 01/28/2010.  Anticoagulation instructions were given to patient.  Results were reviewed/authorized by Bethena Midget, RN, BSN.  He was notified by Bethena Midget, RN, BSN.         Prior Anticoagulation Instructions: INR 2.2 Continue 5mg s everyday except 7.5mg s on Mondays, Wednesdays and Fridays. Recheck in 2 weeks.   Current Anticoagulation Instructions: INR 2.8 Continue 5mg s everyday except 7.5mg s Mondays, Wednesdays and Fridays. Recheck in 4 weeks.

## 2010-12-01 NOTE — Progress Notes (Signed)
Summary: RESULTS FROM PACER PHONE CHECK  Phone Note Call from Patient Call back at [facility] Phone [phone]   Caller: Patient Summary of Call: PT CALLING REGARDING  PACER PHONE CHECK AND WANT ANY RESULTS. Initial call taken by: Judie Grieve,  February 03, 2010 3:43 PM  Follow-up for Phone Call        Spoke with pt. Pacer phone check results given. I also let pt. know he will receive a letter about results and date of next pacer phone check.  Follow-up by: Ollen Gross, RN, BSN,  February 03, 2010 3:54 PM

## 2010-12-01 NOTE — Medication Information (Signed)
Summary: rov/eac  Anticoagulant Therapy  Managed by: Bethena Midget, RN, BSN Referring MD: Linus Orn MD: Gala Romney MD, Reuel Boom Indication 1: Atrial Flutter (ICD-427.32) Lab Used: [facility] Site: [facility] INR POC 1.9 INR RANGE 2 - 3  Dietary changes: no    Health status changes: no    Bleeding/hemorrhagic complications: no    Recent/future hospitalizations: no    Any changes in medication regimen? no    Recent/future dental: no  Any missed doses?: no       Is patient compliant with meds? yes       Allergies: No Known Drug Allergies  Anticoagulation Management History:      The patient is taking warfarin and comes in today for a routine follow up visit.  Negative risk factors for bleeding include an age less than 20 years old.  The bleeding index is 'low risk'.  Negative CHADS2 values include Age > 45 years old.  The start date was 12/26/2007.  His last INR was 12.0 ratio.  Anticoagulation responsible provider: Bensimhon MD, Reuel Boom.  INR POC: 1.9.  Cuvette Lot#: [id number].  Exp: 06/2011.    Anticoagulation Management Assessment/Plan:      The patient's current anticoagulation dose is Warfarin sodium 5 mg tabs: Use as directed by Anticoagulation Clinic.  The target INR is 2 - 3.  The next INR is due 05/13/2010.  Anticoagulation instructions were given to patient.  Results were reviewed/authorized by Bethena Midget, RN, BSN.  He was notified by Bethena Midget, RN, BSN.         Prior Anticoagulation Instructions: INR 2.4  Continue taking 1.5 tablets on Monday, Wednesday, and Friday and 1 tablet all other dyas.  Return to clinic in 4 weeks.    Current Anticoagulation Instructions: INR 1.9 Today take 2 pills then resume 1 pill everyday except 1 1/2 pills on Mondays, Wednesdays and Fridays. Recheck in 4 weeks.

## 2010-12-03 NOTE — Letter (Signed)
Summary: Device-Delinquent Phone Journalist, newspaper, Main Office  [address]   [place], [place] [address]   Phone: [phone]  Fax: [phone]     November 10, 2010 MRN: [id number]   Charles Le [address] [place], [place]  [address]   Dear [name],  According to our records, you were scheduled for a device phone transmission on  11-05-2010.     We did not receive any results from this check.  If you transmitted on your scheduled day, please call us to help troubleshoot your system.  If you forgot to send your transmission, please send one upon receipt of this letter.  Thank you,   Architectural technologist [facility]

## 2010-12-03 NOTE — Medication Information (Signed)
Summary: rov/nb  Anticoagulant Therapy  Managed by: Bethena Midget, RN, BSN Referring MD: Linus Orn MD: Juanda Chance MD, [facility] Indication 1: Atrial Flutter (ICD-427.32) Lab Used: [facility] Site: [facility] INR POC 2.9 INR RANGE 2 - 3  Dietary changes: no    Health status changes: no    Bleeding/hemorrhagic complications: no    Recent/future hospitalizations: no    Any changes in medication regimen? no    Recent/future dental: no  Any missed doses?: no       Is patient compliant with meds? yes       Allergies: No Known Drug Allergies  Anticoagulation Management History:      The patient is taking warfarin and comes in today for a routine follow up visit.  Negative risk factors for bleeding include an age less than 40 years old.  The bleeding index is 'low risk'.  Negative CHADS2 values include Age > 50 years old.  The start date was 12/26/2007.  His last INR was 7.2 ratio.  Anticoagulation responsible Rohen Kimes: Juanda Chance MD, Smitty Cords.  INR POC: 2.9.  Cuvette Lot#: [id number].  Exp: 11/2011.    Anticoagulation Management Assessment/Plan:      The patient's current anticoagulation dose is Warfarin sodium 5 mg tabs: Use as directed by Anticoagulation Clinic.  The target INR is 2 - 3.  The next INR is due 11/19/2010.  Anticoagulation instructions were given to patient.  Results were reviewed/authorized by Bethena Midget, RN, BSN.  He was notified by Bethena Midget, RN, BSN.         Prior Anticoagulation Instructions: INR 2.1 Continue previous dose of 1 tablet everyday Recheck INR in 3 weeks  Current Anticoagulation Instructions: INR 2.9 Continue 5mg s everyday. Recheck in 4 weeks.

## 2010-12-03 NOTE — Medication Information (Signed)
Summary: Coumadin Clinic  Anticoagulant Therapy  Managed by: Inactive Referring MD: Linus Orn MD: Juanda Chance MD, [facility] Indication 1: Atrial Flutter (ICD-427.32) Lab Used: [facility] Site: [facility] INR RANGE 2 - 3          Comments: Pt changed to Xarelto by Dr. Graciela Husbands  Allergies: No Known Drug Allergies  Anticoagulation Management History:      Negative risk factors for bleeding include an age less than 54 years old.  The bleeding index is 'low risk'.  Negative CHADS2 values include Age > 19 years old.  The start date was 12/26/2007.  His last INR was 7.2 ratio.  Anticoagulation responsible provider: Juanda Chance MD, Smitty Cords.  Exp: 11/2011.    Anticoagulation Management Assessment/Plan:      The patient's current anticoagulation dose is Warfarin sodium 5 mg tabs: Use as directed by Anticoagulation Clinic.  The target INR is 2 - 3.  The next INR is due 11/19/2010.  Anticoagulation instructions were given to patient.  Results were reviewed/authorized by Inactive.         Prior Anticoagulation Instructions: INR 2.9 Continue 5mg s everyday. Recheck in 4 weeks.

## 2010-12-03 NOTE — Assessment & Plan Note (Signed)
Summary: defib check.[email or web address].amber   Visit Type:  ICD-St. Jude / 1 year follow u;p  CC:  no complaints.  History of Present Illness:   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 He has high-grade heart block, abnormal vasomotor response nonsustained ventricular arrhythmias and underwent ICD implantation for primary prevention.  He also has a history of EP testing demonstrating a left atrial flutter which he has been treated with rate control and Coumadin. Referral to Dr. Johney Frame last fall for consideration of ablation resulted in a decision to continue the current plan.  The patient is now disabled. He has been working out 3 days a week doing low repetition highweight anaerobic activity.  he has been using supplements the forms of creatine  He denies any significant change in his exercise tolerance. He has had no chest pain or shortness of breath nor no peripheral edema. He has had no ICD discharges or palpitations. his other major complaint is fatigue. He has some sleep disorder breathing. He is able to take naps easily but does not fall asleep. He says is her friend does not say that he snores Echo December 2000 and demonstrated normal left ventricular function  Problems Prior to Update: 1)  Increased Blood Pressure  (ICD-796.2) 2)  Preoperative Examination  (ICD-V72.84) 3)  Atrial Flutter, Left Sided  (ICD-427.32) 4)  Av Block, Complete  (ICD-426.0) 5)  Implantation of Defibrillator St Jude  (ICD-V45.02) 6)  Hocm / Ihss  (ICD-425.1) 7)  Septic/hypovolemic Shock  (ICD-785.59)  Current Medications (verified): 1)  Atenolol 50 Mg Tabs (Atenolol) .... Take 1 Tablet By Mouth Once A Day 2)  Warfarin Sodium 5 Mg Tabs (Warfarin Sodium) .... Use As Directed By Anticoagulation Clinic 3)  Nexium 40 Mg Cpdr (Esomeprazole Magnesium) .... Take One Tablet  Once Daily  Allergies (verified): No Known Drug Allergies  Past  History:  Past Medical History: Last updated: 09/30/2009  1. Hypertrophic cardiomyopathy   2. Atypical atrial flutter   3. Status post implantation of a St. Jude Medical Atlas DR [phone] (serial [id number]), dual chamber ICD implanted May 24, 2006.   4. Complete AV block.   5. Chronotropic incompetence.  hip arthritis with anticipated hip replacement  Family History: Last updated: 10/22/2010 The patient's paternal grandmother had hypertrophic cardiomyopathy.  He also has multiple cousins with hypertrophic cardiomyopathy.  He is unaware of any history of sudden cardiac death in the family.   Social History: Last updated: 10/22/2010 The patient lives in [place].  He continues to smoke less than 1 pack per day.  Vital Signs:  Patient profile:   55 year old male Height:      70 inches Weight:      258.50 pounds BMI:     37.22 Pulse rate:   70 / minute BP sitting:   135 / 94  (left arm) Cuff size:   regular  Vitals Entered By: Caralee Ates CMA (October 22, 2010 2:27 PM)  Physical Exam  General:  The patient was alert and oriented in no acute distress. HEENT Normal.  Neck veins were flat, carotids were brisk.  Lungs were clear.  Heart sounds were regular without murmurs with Valsalva or upright Abdomen was soft with active bowel sounds. There is no clubbing cyanosis or edema. Skin Warm and dry     ICD Specifications Following MD:  Sherryl Manges, MD     ICD Vendor:  [facility]  ICD Model Number:  V243     ICD Serial Number:  [id number] ICD DOI:  05/24/2006     ICD Implanting MD:  Sherryl Manges, MD  Lead 1:    Location: RA     DOI: 05/24/2006     Model #: [id number]     Serial #: [id number]     Status: active Lead 2:    Location: RV     DOI: 05/24/2006     Model #: [id number]     Serial #: [id number]     Status: active  Indications::  HOCM   ICD Follow Up Remote Check?  No Battery Voltage:  2.58 V     Charge Time:  13.6 seconds     Underlying rhythm:  A-fib ICD Dependent:  Yes       ICD  Device Measurements Atrium:  Amplitude: 3.0 mV, Impedance: 410 ohms,  Right Ventricle:  Impedance: 560 ohms, Threshold: 0.75 V at 0.5 msec  Episodes Percent Mode Switch:  100%     Coumadin:  Yes Ventricular Pacing:  97%  Brady Parameters Mode DDDR     Lower Rate Limit:  60     Upper Rate Limit 115 PAV 170     Sensed AV Delay:  150  Tachy Zones VF:  200     Tech Comments:  No parameter changes.  Device function normal.  A-fib, +coumadin.  Merlin transmissions every 3 months.  ROV 1 year with Dr. Graciela Husbands. Altha Harm, LPN  October 22, 2010 3:29 PM   Impression & Recommendations:  Problem # 1:  INCREASED BLOOD PRESSURE (ICD-796.2) the patient's blood pressure is elevated today. We'll need to keep an eye on this as we wean his beta blocker.   Problem # 2:  ATRIAL FLUTTER, LEFT SIDED (ICD-427.32)  he has persistent atrial arrhythmias. He is currently on warfarin. We have discussed alternative regimes. We will begin him on rivoroxaban  we discussed potential risks including bleeding issues. He is agreeable to proceeding. His updated medication list for this problem includes:    Atenolol 50 Mg Tabs (Atenolol) .[name]... Take 1 tablet by mouth once a day    Warfarin Sodium 5 Mg Tabs (Warfarin sodium) ..... Use as directed by anticoagulation clinic  Orders: EKG w/ Interpretation (93000)  The following medications were removed from the medication list:    Warfarin Sodium 5 Mg Tabs (Warfarin sodium) ..... Use as directed by anticoagulation clinic His updated medication list for this problem includes:    Atenolol 50 Mg Tabs (Atenolol) .[name]... Take 1 tablet by mouth once a day  Problem # 3:  AV BLOCK, COMPLETE (ICD-426.0)  stable The following medications were removed from the medication list:    Warfarin Sodium 5 Mg Tabs (Warfarin sodium) ..... Use as directed by anticoagulation clinic His updated medication list for this problem includes:    Atenolol 50 Mg Tabs (Atenolol) .[name]... Take 1 tablet  by mouth once a day  Orders: EKG w/ Interpretation (93000)  The following medications were removed from the medication list:    Warfarin Sodium 5 Mg Tabs (Warfarin sodium) ..... Use as directed by anticoagulation clinic His updated medication list for this problem includes:    Atenolol 50 Mg Tabs (Atenolol) .[name]... Take 1 tablet by mouth once a day  Problem # 4:  HOCM / IHSS (ICD-425.1)  stable The following medications were removed from the medication list:    Warfarin Sodium 5 Mg Tabs (Warfarin sodium) ..... Use as directed by anticoagulation  clinic His updated medication list for this problem includes:    Atenolol 50 Mg Tabs (Atenolol) .[name]... Take 1 tablet by mouth once a day  The following medications were removed from the medication list:    Warfarin Sodium 5 Mg Tabs (Warfarin sodium) ..... Use as directed by anticoagulation clinic His updated medication list for this problem includes:    Atenolol 50 Mg Tabs (Atenolol) .[name]... Take 1 tablet by mouth once a day  Problem # 5:  IMPLANTATION OF DEFIBRILLATOR ST JUDE (ICD-V45.02) Device parameters and data were reviewed and no changes were made  Patient Instructions: 1)  Your physician recommends that you schedule a follow-up appointment in: 8 weeks 2)  Your physician has recommended you make the following change in your medication: Stop Coumadin, Start Xarelto 4 days after stopping Coumadin.  Take 1/2 pill of Atenolol every day for 2 weeks and then take 1/2 pill every other day for 2 weeks and then discontinue medication.  Prescriptions: XARELTO 10 MG TABS (RIVAROXABAN) once daily  #30 x 11   Entered by:   Claris Gladden RN   Authorized by:   Nathen May, MD, [facility]   Signed by:   Claris Gladden RN on 10/22/2010   Method used:   Electronically to        [facility] [phone]* (retail)       [address]       [place], [place]  [address]       Ph: [phone]       Fax: [phone]   RxID:   [id number]

## 2010-12-23 ENCOUNTER — Encounter: Payer: Self-pay | Admitting: Internal Medicine

## 2011-01-14 ENCOUNTER — Encounter: Payer: Self-pay | Admitting: Internal Medicine

## 2011-01-19 ENCOUNTER — Other Ambulatory Visit: Payer: Self-pay | Admitting: Internal Medicine

## 2011-01-20 ENCOUNTER — Other Ambulatory Visit: Payer: Self-pay | Admitting: *Deleted

## 2011-01-21 ENCOUNTER — Encounter: Payer: Self-pay | Admitting: *Deleted

## 2011-01-25 ENCOUNTER — Other Ambulatory Visit: Payer: Self-pay

## 2011-01-25 DIAGNOSIS — Z8719 Personal history of other diseases of the digestive system: Secondary | ICD-10-CM

## 2011-01-25 DIAGNOSIS — I4892 Unspecified atrial flutter: Secondary | ICD-10-CM

## 2011-01-25 MED ORDER — ESOMEPRAZOLE MAGNESIUM 40 MG PO CPDR: 40.0000 mg | DELAYED_RELEASE_CAPSULE | Freq: Every day | ORAL | Status: DC

## 2011-01-25 MED ORDER — RIVAROXABAN 10 MG PO TABS: 10.0000 mg | ORAL_TABLET | Freq: Every day | ORAL | Status: DC

## 2011-01-27 ENCOUNTER — Ambulatory Visit (INDEPENDENT_AMBULATORY_CARE_PROVIDER_SITE_OTHER): Payer: Self-pay | Admitting: Internal Medicine

## 2011-01-27 ENCOUNTER — Encounter: Payer: Self-pay | Admitting: Internal Medicine

## 2011-01-27 DIAGNOSIS — I4821 Permanent atrial fibrillation: Secondary | ICD-10-CM | POA: Insufficient documentation

## 2011-01-27 DIAGNOSIS — I4891 Unspecified atrial fibrillation: Secondary | ICD-10-CM

## 2011-01-27 DIAGNOSIS — I421 Obstructive hypertrophic cardiomyopathy: Secondary | ICD-10-CM

## 2011-01-27 DIAGNOSIS — I442 Atrioventricular block, complete: Secondary | ICD-10-CM

## 2011-01-27 DIAGNOSIS — Z95810 Presence of automatic (implantable) cardiac defibrillator: Secondary | ICD-10-CM

## 2011-01-27 DIAGNOSIS — T82198A Other mechanical complication of other cardiac electronic device, initial encounter: Secondary | ICD-10-CM

## 2011-01-27 DIAGNOSIS — R609 Edema, unspecified: Secondary | ICD-10-CM

## 2011-01-27 NOTE — Assessment & Plan Note (Signed)
The patient's device was interrogated.  The information was reviewed. No changes were made in the programming.    

## 2011-01-27 NOTE — Assessment & Plan Note (Signed)
Patient is stable and functional class 1-2 at this point. Continue current therapies. He has been intolerant of beta blockers

## 2011-01-27 NOTE — Assessment & Plan Note (Signed)
stable °

## 2011-01-27 NOTE — Patient Instructions (Signed)
Remote monitoring is used to monitor your ICD from home. This monitoring reduces the number of office visits required to check your device to one time per year. It allows [place] to keep an eye on the functioning of your device to ensure it is working properly. You are scheduled for a device check from home on 04-29-11. You may send your transmission at any time that day. If you have a wireless device, the transmission will be sent automatically. After your physician reviews your transmission, you will receive a postcard with your next transmission date. Your physician recommends that you schedule a follow-up appointment in: 12 months with Dr Graciela Husbands

## 2011-01-27 NOTE — Progress Notes (Signed)
  HPI  Charles Le is a 55 y.o. male hypertrophic cardiomyopathy complicated by permanent atrial arrhythmias. He also has complete heart block and is status post ICD implantation for primary prevention.  This is having come off the atenolol in January he feels terrific. His blood pressures at home are in the 120/80 range  Past Medical History  Diagnosis Date  . Hypertrophic obstructive cardiomyopathy     hypertrophic  . Aflutter     atypical-left sided  . DDD-ICD     [facility] Atlas DR [phone] ) dual chamber ICD implanted May 24, 2006)  . Atrioventricular block, complete     complete    Past Surgical History  Procedure Date  . Hip surgery     replacement  . Icd implantation     ICD- St Jude    Current Outpatient Prescriptions  Medication Sig Dispense Refill  . esomeprazole (NEXIUM) 40 MG capsule Take 1 capsule (40 mg total) by mouth daily before breakfast.  30 capsule  6  . rivaroxaban (XARELTO) 10 MG TABS tablet Take 1 tablet (10 mg total) by mouth daily.  30 tablet  6  . DISCONTD: atenolol (TENORMIN) 50 MG tablet Take 50 mg by mouth daily.         Allergies no known allergies  Review of Systems negative except from HPI and PMH  Physical Exam Well developed and well nourished in no acute distress HENT normal E scleral and icterus clear Neck Supple JVP flat; carotids brisk and full Clear to ausculation Regular rate and rhythm, no murmurs gallops or rub Soft with active bowel sounds No clubbing cyanosis ; 1+ peripheral edema Alert and oriented, grossly normal motor and sensory function Skin Warm and Dry     Assessment and  Plan

## 2011-01-27 NOTE — Assessment & Plan Note (Signed)
Patient edema may be related to salt intake. We have discussed this. Right now his home blood pressures are not too bad. Will not add a drug at this time.

## 2011-01-27 NOTE — Assessment & Plan Note (Signed)
Device dependent. 

## 2011-01-27 NOTE — Assessment & Plan Note (Signed)
Patient has complete heart block the rate control is not an issue. The patient is taking Xiralto for thromboembolic risk reduction

## 2011-01-28 NOTE — Telephone Encounter (Signed)
[address] °

## 2011-01-30 IMAGING — CR DG CHEST 2V
2 series · 2 of 2 positions shown · non-contrast
Comparison: 05/05/2006

CLINICAL DATA: Left hip osteoarthritis.  Preop respiratory exam for
hip replacement.  Hypertrophic cardiomyopathy.

CHEST - 2 VIEW

[w chest pa]
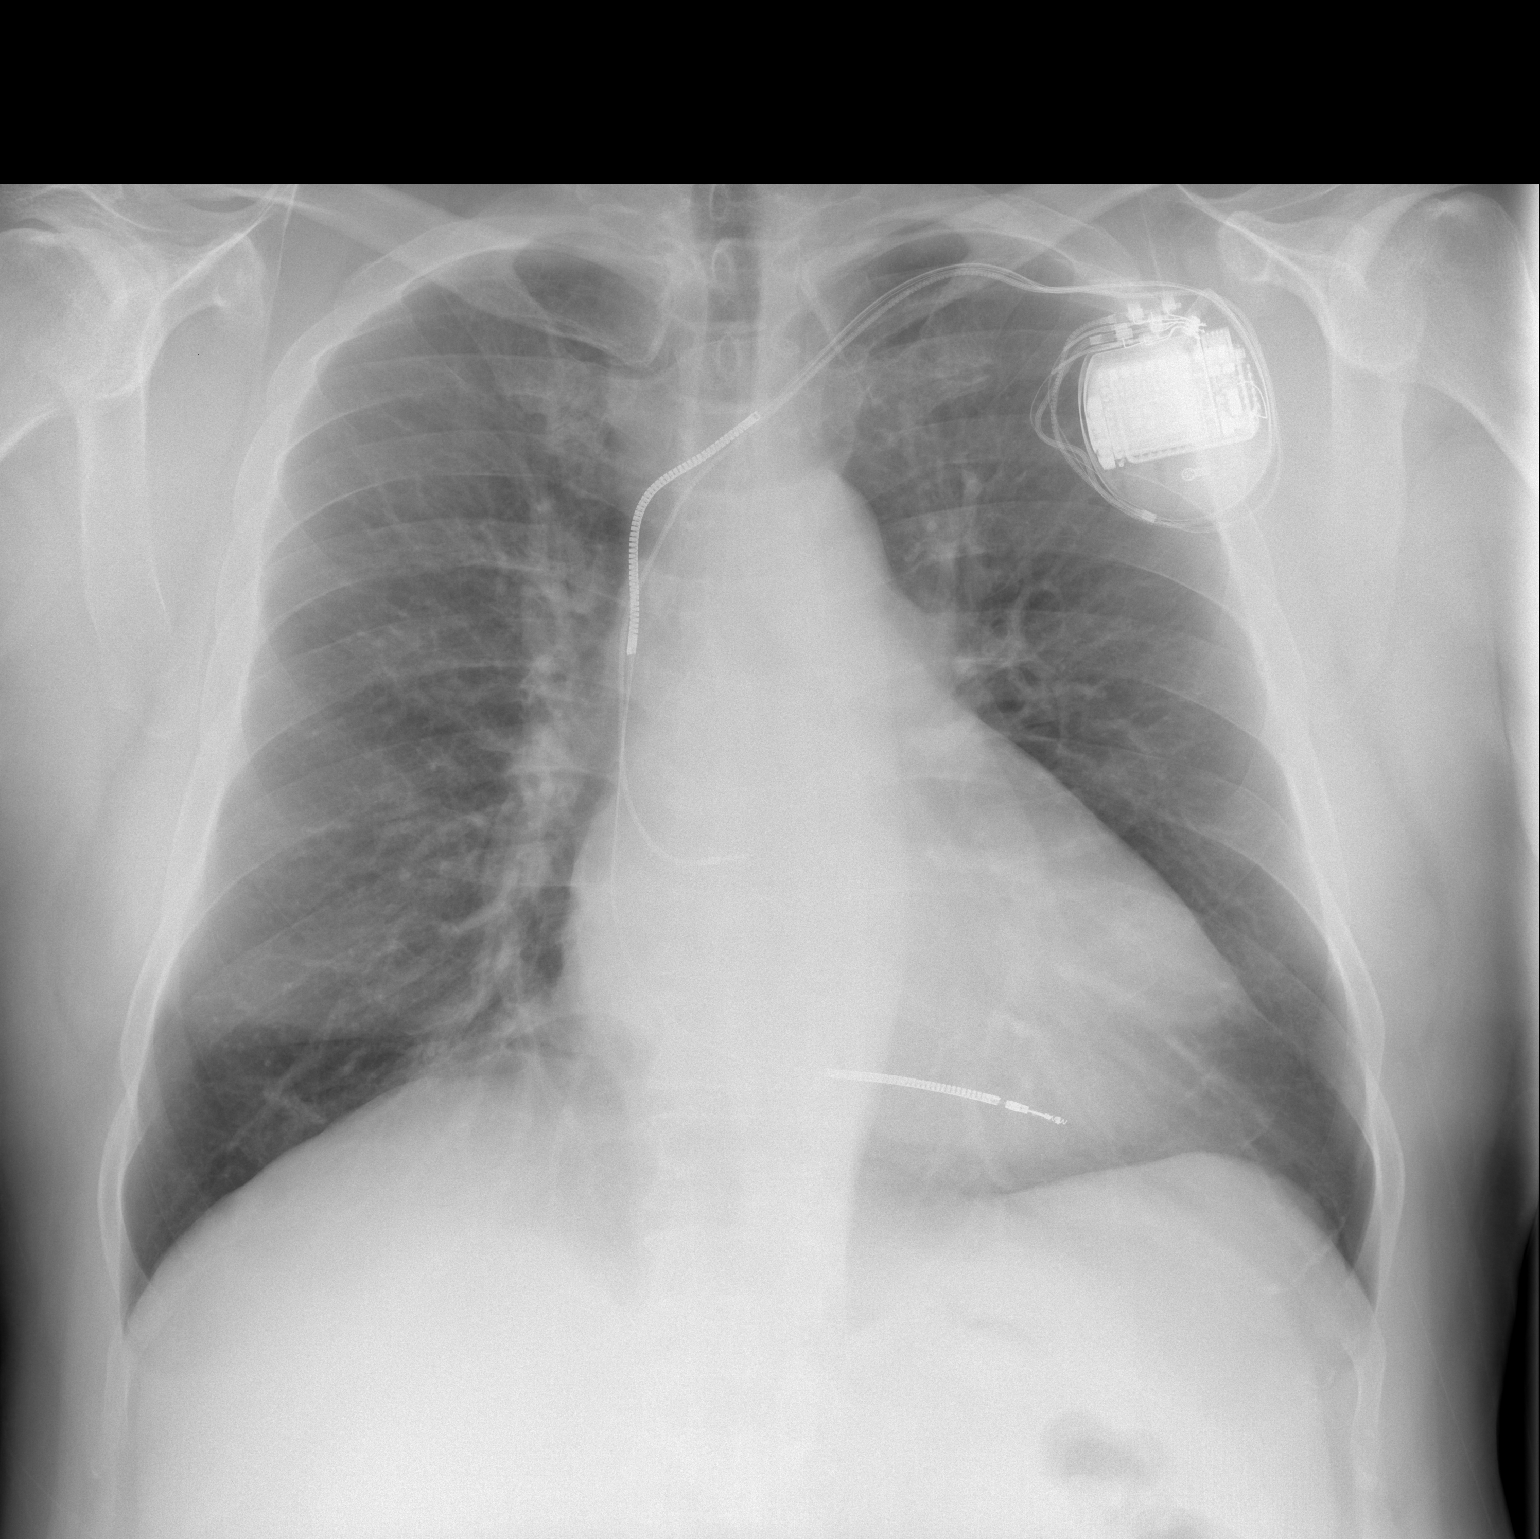

[w chest lat]
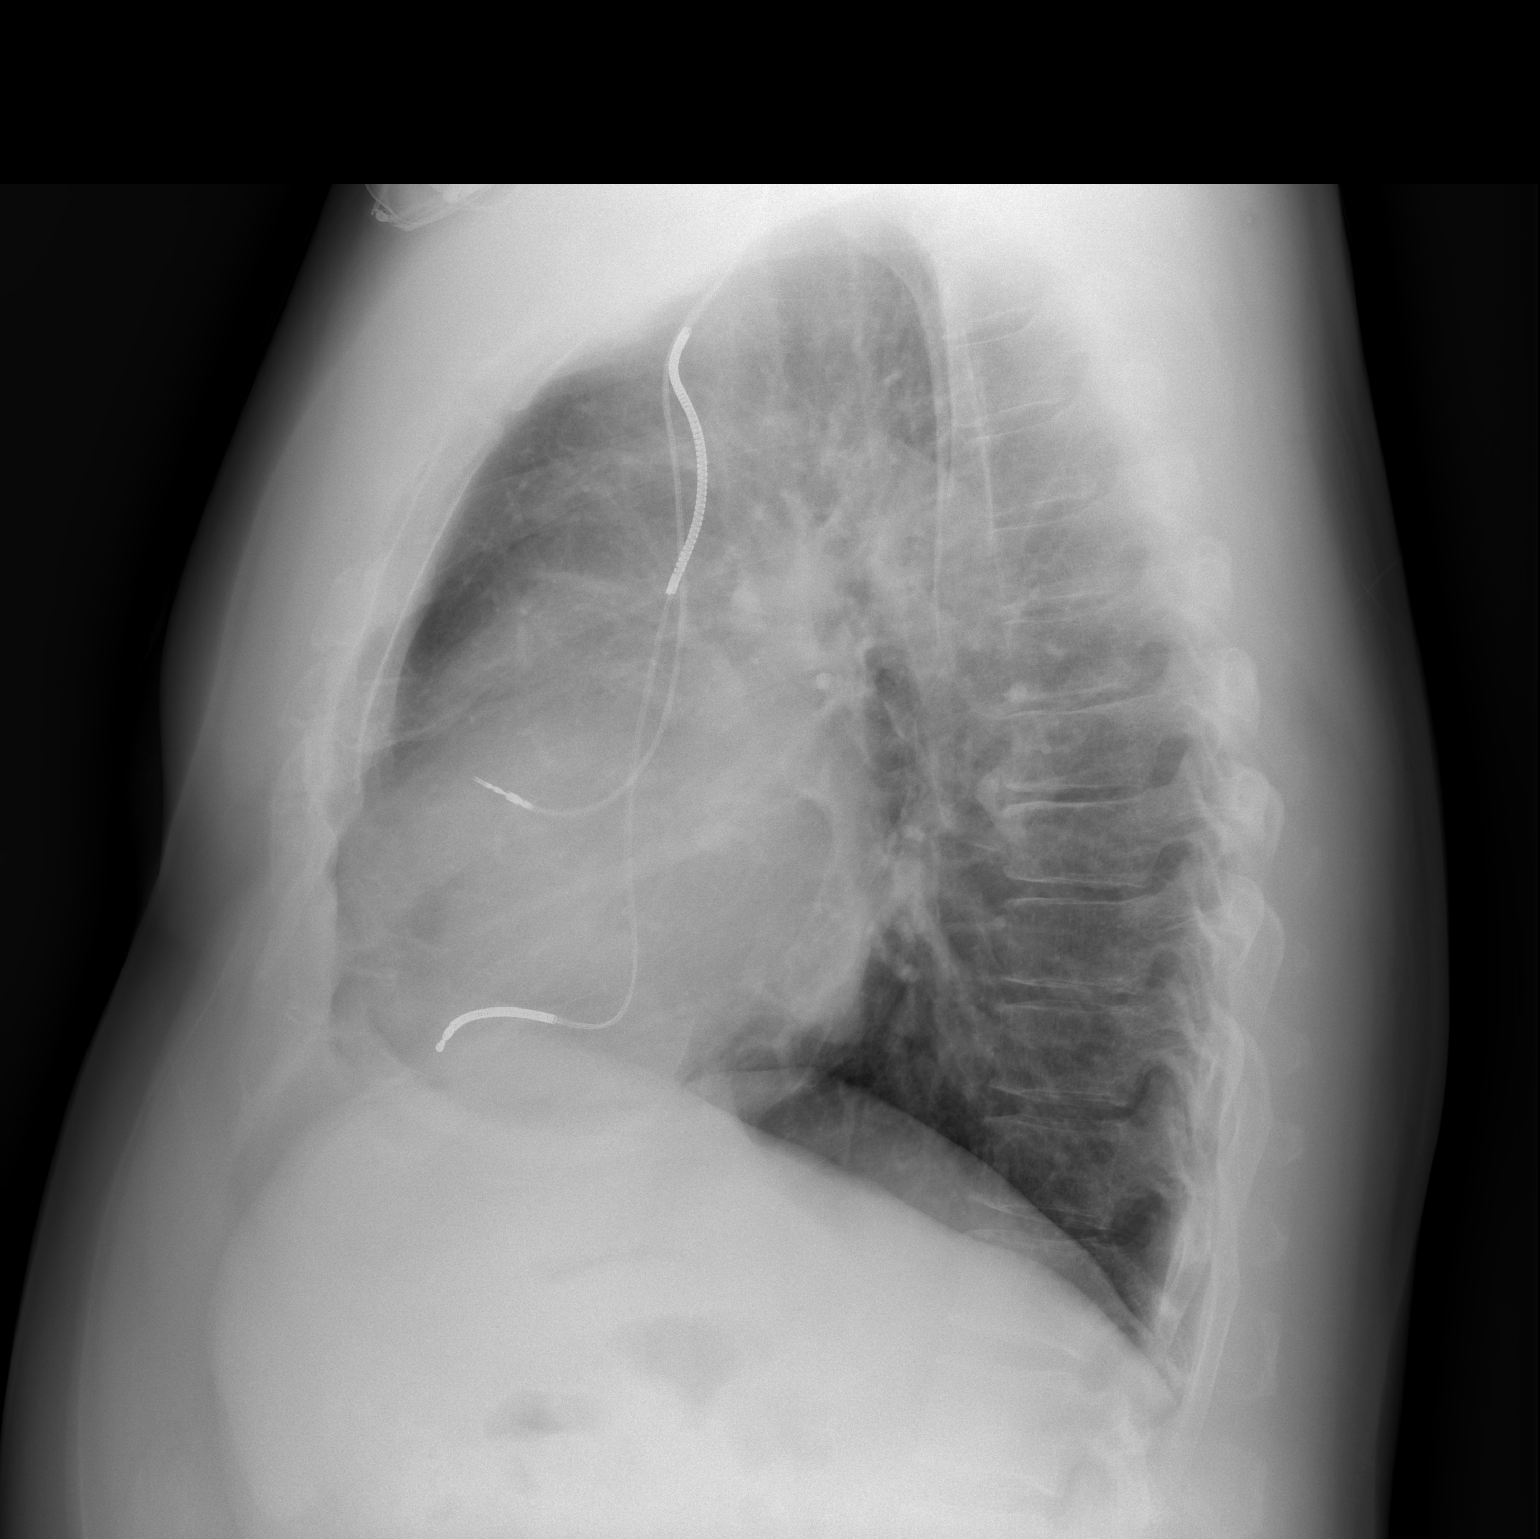

[2 of 2 positions shown; findings below may reference images not displayed]

FINDINGS: Mild to moderate cardiomegaly stable.  AICD remains in
appropriate position.  Both lungs are clear.  No evidence of
pleural effusion.  No mass or adenopathy identified.
IMPRESSION: Stable cardiomegaly.  No active lung disease.

## 2011-02-02 LAB — CBC
HCT: 31.5 % — ABNORMAL LOW (ref 39.0–52.0)
HCT: 43.7 % (ref 39.0–52.0)
Hemoglobin: 15.3 g/dL (ref 13.0–17.0)
MCHC: 34.3 g/dL (ref 30.0–36.0)
MCV: 98.3 fL (ref 78.0–100.0)
MCV: 98.3 fL (ref 78.0–100.0)
Platelets: 155 10*3/uL (ref 150–400)
Platelets: 171 10*3/uL (ref 150–400)
Platelets: 202 10*3/uL (ref 150–400)
RDW: 13.5 % (ref 11.5–15.5)
RDW: 13.6 % (ref 11.5–15.5)
RDW: 13.6 % (ref 11.5–15.5)

## 2011-02-02 LAB — URINALYSIS, ROUTINE W REFLEX MICROSCOPIC
Nitrite: NEGATIVE
Protein, ur: NEGATIVE mg/dL

## 2011-02-02 LAB — BASIC METABOLIC PANEL
BUN: 12 mg/dL (ref 6–23)
BUN: 8 mg/dL (ref 6–23)
BUN: 8 mg/dL (ref 6–23)
CO2: 28 mEq/L (ref 19–32)
Calcium: 8.2 mg/dL — ABNORMAL LOW (ref 8.4–10.5)
Calcium: 8.5 mg/dL (ref 8.4–10.5)
Chloride: 99 mEq/L (ref 96–112)
Creatinine, Ser: 1.03 mg/dL (ref 0.4–1.5)
Creatinine, Ser: 1.07 mg/dL (ref 0.4–1.5)
GFR calc Af Amer: >60 mL/min (ref >60)
GFR calc non Af Amer: 58 mL/min — ABNORMAL LOW (ref >60)
GFR calc non Af Amer: >60 mL/min (ref >60)
Glucose, Bld: 117 mg/dL — ABNORMAL HIGH (ref 70–99)
Glucose, Bld: 118 mg/dL — ABNORMAL HIGH (ref 70–99)
Potassium: 3.7 mEq/L (ref 3.5–5.1)
Potassium: 3.7 mEq/L (ref 3.5–5.1)

## 2011-02-02 LAB — DIFFERENTIAL
Basophils Absolute: 0 10*3/uL (ref 0.0–0.1)
Eosinophils Absolute: 0.2 10*3/uL (ref 0.0–0.7)
Eosinophils Relative: 4 % (ref 0–5)
Lymphocytes Relative: 40 % (ref 12–46)
Monocytes Absolute: 0.9 10*3/uL (ref 0.1–1.0)

## 2011-02-02 LAB — PROTIME-INR
INR: 1.12 (ref 0.00–1.49)
Prothrombin Time: 13.2 seconds (ref 11.6–15.2)
Prothrombin Time: 14.3 seconds (ref 11.6–15.2)
Prothrombin Time: 15.6 seconds — ABNORMAL HIGH (ref 11.6–15.2)
Prothrombin Time: 26.7 seconds — ABNORMAL HIGH (ref 11.6–15.2)

## 2011-02-02 LAB — TYPE AND SCREEN
ABO/RH(D): A POS
Antibody Screen: NEGATIVE

## 2011-02-02 LAB — ABO/RH: ABO/RH(D): A POS

## 2011-02-04 IMAGING — CR DG PORTABLE PELVIS
1 series · 1 of 1 positions shown · non-contrast
Comparison: 08/29/2009

CLINICAL DATA: Postop left T H A

PORTABLE PELVIS

[view not recorded]
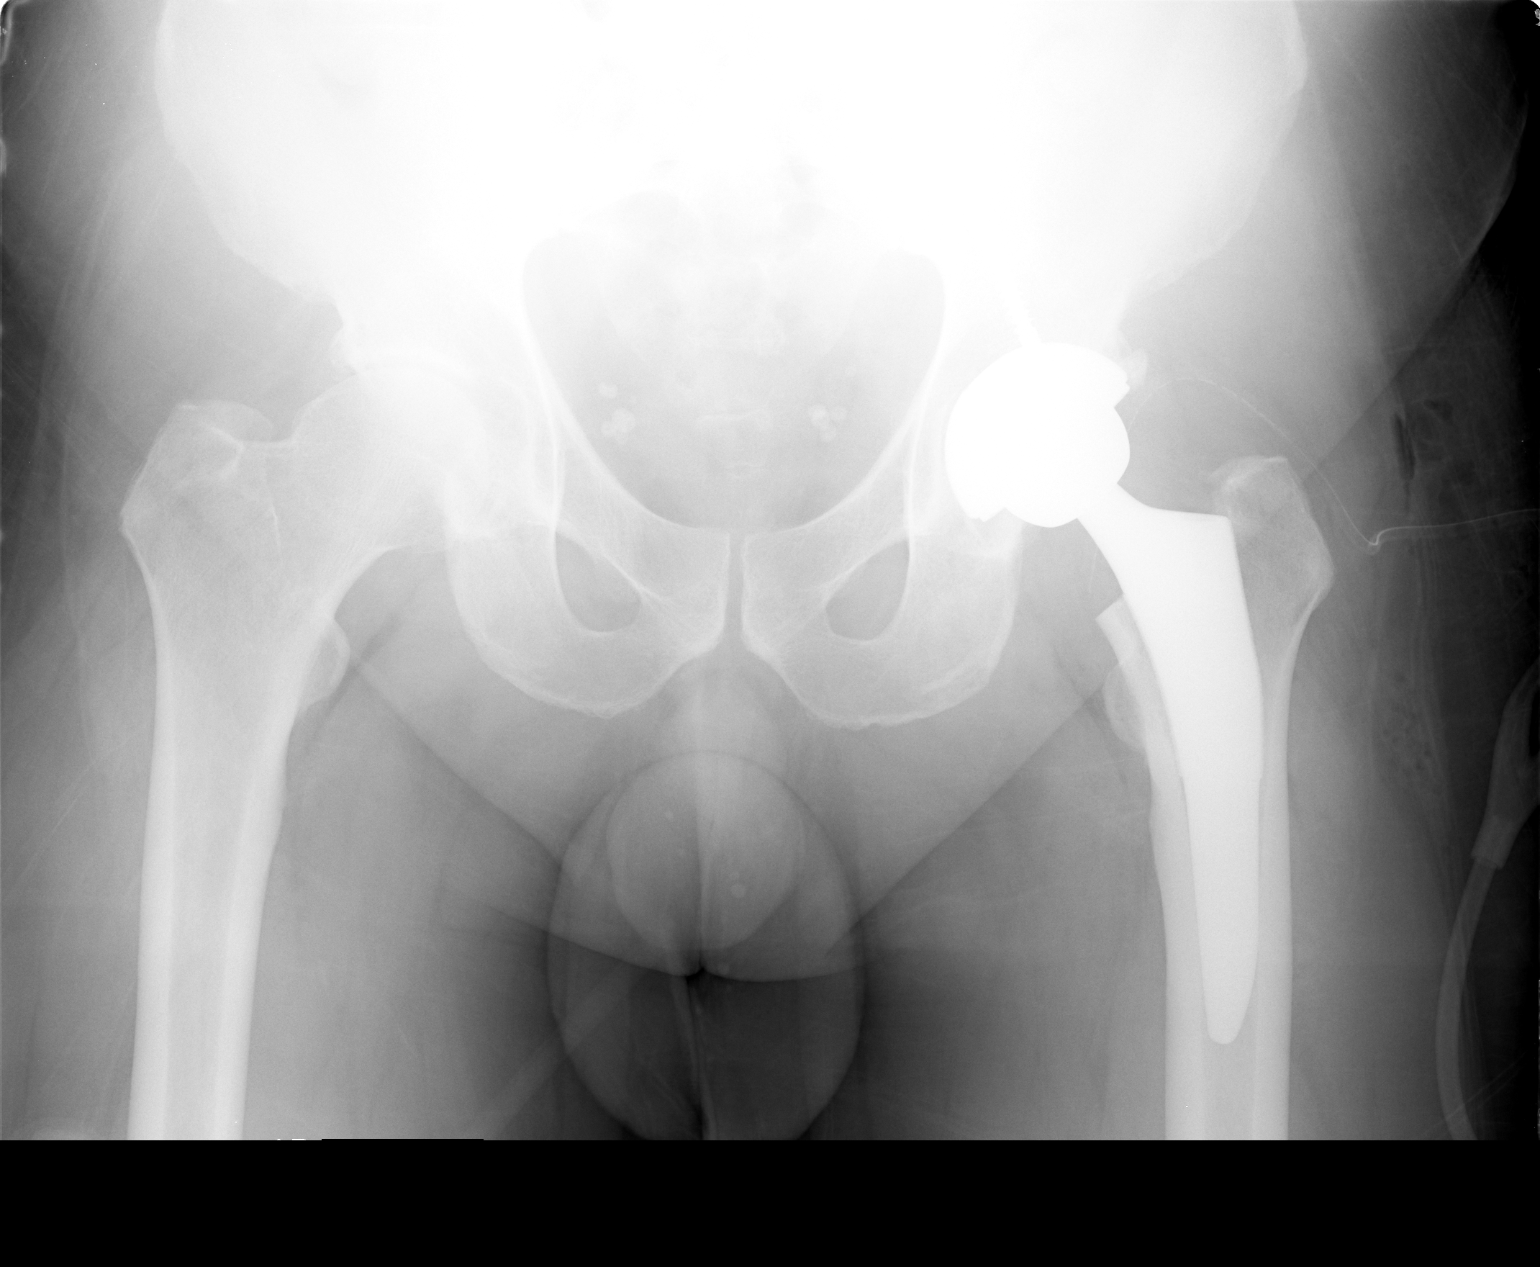

[1 of 1 positions shown; findings below may reference images not displayed]

FINDINGS: Satisfactory appearance of the left hip status post
placement of the acetabular and femoral prosthetic components.  The
components appear to be in satisfactory position and alignment.
Radiopaque drain is noted in the soft tissues.
IMPRESSION: Satisfactory appearance of the left total hip arthroplasty.

## 2011-03-04 ENCOUNTER — Emergency Department (INDEPENDENT_AMBULATORY_CARE_PROVIDER_SITE_OTHER): Payer: Self-pay

## 2011-03-04 ENCOUNTER — Emergency Department (HOSPITAL_BASED_OUTPATIENT_CLINIC_OR_DEPARTMENT_OTHER)
Admission: EM | Admit: 2011-03-04 | Discharge: 2011-03-04 | Disposition: A | Discharge status: Home / Self Care | Payer: Self-pay | Attending: Emergency Medicine | Admitting: Emergency Medicine

## 2011-03-04 ENCOUNTER — Telehealth: Payer: Self-pay | Admitting: Internal Medicine

## 2011-03-04 DIAGNOSIS — M25519 Pain in unspecified shoulder: Secondary | ICD-10-CM

## 2011-03-04 DIAGNOSIS — Z95810 Presence of automatic (implantable) cardiac defibrillator: Secondary | ICD-10-CM | POA: Insufficient documentation

## 2011-03-04 NOTE — Telephone Encounter (Signed)
UNABLE TO LEAVE MESSAGE./CY 

## 2011-03-04 NOTE — Telephone Encounter (Signed)
Pt has question re his meds. Pt would like to talk a nurse.

## 2011-03-05 ENCOUNTER — Other Ambulatory Visit: Payer: Self-pay | Admitting: Internal Medicine

## 2011-03-05 DIAGNOSIS — I4892 Unspecified atrial flutter: Secondary | ICD-10-CM

## 2011-03-05 DIAGNOSIS — Z8719 Personal history of other diseases of the digestive system: Secondary | ICD-10-CM

## 2011-03-05 MED ORDER — ESOMEPRAZOLE MAGNESIUM 40 MG PO CPDR: 40.0000 mg | DELAYED_RELEASE_CAPSULE | Freq: Every day | ORAL | Status: DC

## 2011-03-05 MED ORDER — RIVAROXABAN 10 MG PO TABS: 10.0000 mg | ORAL_TABLET | Freq: Every day | ORAL | Status: DC

## 2011-03-05 NOTE — Telephone Encounter (Signed)
Spoke with mother and told her I sent in the script but I didn't think ins would cover it if it is to soon, I told her there is samples of Xarelto there that I could give him some of those if he can't get a script.

## 2011-03-05 NOTE — Telephone Encounter (Addendum)
Pt needs nexium and xarelto to be called in to [facility]/[address] # [phone]  Pt has been put for three days.

## 2011-03-05 NOTE — Telephone Encounter (Signed)
Pt needs rx called in asap-has been out for three days and he broke up with his fiance who is keeping his meds and won't give them back, mother calling to see if we can call in now

## 2011-03-10 ENCOUNTER — Encounter: Payer: Self-pay | Admitting: Family Medicine

## 2011-03-10 ENCOUNTER — Ambulatory Visit (INDEPENDENT_AMBULATORY_CARE_PROVIDER_SITE_OTHER): Payer: Self-pay | Admitting: Family Medicine

## 2011-03-10 VITALS — BP 132/88 | HR 69 | Temp 97.4°F | Ht 70.0 in | Wt 254.8 lb

## 2011-03-10 DIAGNOSIS — M25511 Pain in right shoulder: Secondary | ICD-10-CM | POA: Insufficient documentation

## 2011-03-10 DIAGNOSIS — M25519 Pain in unspecified shoulder: Secondary | ICD-10-CM

## 2011-03-10 MED ORDER — OXYCODONE-ACETAMINOPHEN 5-325 MG PO TABS: 1.0000 | ORAL_TABLET | Freq: Four times a day (QID) | ORAL | Status: AC | PRN

## 2011-03-10 NOTE — Progress Notes (Signed)
Subjective:    Patient ID: Charles Le, male    DOB: 01-10-1956, 55 y.o.   MRN: [id number]  HPI  55 yo M here for right shoulder pain  Patient reports that on 5/2 her girlfriend hyperextended his right arm when he was standing after they were 'goofing around' States he felt pain and a pop within his right shoulder. Immediately after could fully move his right arm but was painful to go overhead. Went to ED and had x-rays showing mild AC DJD but otherwise negative. Given sling which he has been using. No radiation of pain down arm. No numbness or tingling + night pain. No neck pain. Is right handed No prior right shoulder injuries/surgeries. Taking oxycodone from ED.  Advised against taking NSAIDs with his h/o heart disease.  Past Medical History  Diagnosis Date  . Hypertrophic obstructive cardiomyopathy     hypertrophic  . Aflutter     atypical-left sided  . DDD-ICD     [facility] Atlas DR [phone] ) dual chamber ICD implanted May 24, 2006)  . Atrioventricular block, complete     complete    Current Outpatient Prescriptions on File Prior to Visit  Medication Sig Dispense Refill  . esomeprazole (NEXIUM) 40 MG capsule Take 1 capsule (40 mg total) by mouth daily before breakfast.  30 capsule  6  . rivaroxaban (XARELTO) 10 MG TABS tablet Take 1 tablet (10 mg total) by mouth daily.  30 tablet  6    Past Surgical History  Procedure Date  . Hip surgery     replacement  . Icd implantation     ICD- St Jude    No Known Allergies  History   Social History  . Marital Status: Single    Spouse Name: N/A    Number of Children: N/A  . Years of Education: N/A   Occupational History  . Not on file.   Social History Main Topics  . Smoking status: Current Everyday Smoker -- 0.5 packs/day for 18 years    Types: Cigarettes  . Smokeless tobacco: Never Used  . Alcohol Use: Not on file  . Drug Use: Not on file  . Sexually Active: Not on file   Other Topics Concern  .  Not on file   Social History Narrative  . No narrative on file    Family History  Problem Relation Age of Onset  . Hypertrophic cardiomyopathy Paternal Grandmother   . Diabetes Paternal Grandmother   . Heart attack Father   . Diabetes Father   . Hypertension Neg Hx     BP 132/88  Pulse 69  Temp(Src) 97.4 F (36.3 C) (Oral)  Ht 5\' 10"  (1.778 m)  Wt 254 lb 12.8 oz (115.577 kg)  BMI 36.56 kg/m2  Review of Systems See HPI above.    Objective:   Physical Exam Gen: NAD R shoulder: No Gross deformity, swelling, bruising. No focal TTP at [facility] joint, biceps tendon, elsewhere about shoulder. FROM but + painful arc.  Negative drop arm sign + hawkins, + neers Negative speeds, yergasons Strength 4+/5 with empty can, 5/5 with resisted IR/ER     MSK u/s R shoulder: AC joint appears normal.  Biceps tendon intact on transverse and long views.  Footplate visualized of subscapularis and supraspinatus - no retraction of muscles.  No calcifications, no evidence of full thickness rotator cuff tears. Assessment & Plan:  1. Right shoulder pain - 2/2 rotator cuff strain.  No evidence of retraction or full  thickness tear on ultrasound to warrant surgical referral.  He cannot have an MRI due to having an ICD.  Start PT.  Sling as needed.  Tylenol or percocet as needed for pain.  If not improving as expected over next 3 weeks will re-ultrasound and consider cortisone subacromial injection.  See instructions for further.

## 2011-03-10 NOTE — Assessment & Plan Note (Signed)
2/2 rotator cuff strain.  No evidence of retraction or full thickness tear on ultrasound to warrant surgical referral.  He cannot have an MRI due to having an ICD.  Start PT.  Sling as needed.  Tylenol or percocet as needed for pain.  If not improving as expected over next 3 weeks will re-ultrasound and consider cortisone subacromial injection.  See instructions for further.

## 2011-03-10 NOTE — Patient Instructions (Signed)
You strained your rotator cuff. Try to avoid painful activities (overhead activities, lifting with extended arm) as much as possible. Tylenol OR percocet as needed for pain - we only prescribe this once and do not refill it so use the percocet judiciously. Subacromial injection may be beneficial to help with pain and to decrease inflammation if you are not improving with physical therapy. Home exercise program with theraband and scapular stabilization exercises - these are very important for long term relief. If not improving at follow-up we will consider repeat ultrasound and cortisone injection. Follow up with me in 2 1/2 - 3 weeks for a recheck.

## 2011-03-16 ENCOUNTER — Ambulatory Visit: Payer: Self-pay | Attending: Family Medicine | Admitting: Physical Therapy

## 2011-03-16 DIAGNOSIS — M25619 Stiffness of unspecified shoulder, not elsewhere classified: Secondary | ICD-10-CM | POA: Insufficient documentation

## 2011-03-16 DIAGNOSIS — M25519 Pain in unspecified shoulder: Secondary | ICD-10-CM | POA: Insufficient documentation

## 2011-03-16 DIAGNOSIS — IMO0001 Reserved for inherently not codable concepts without codable children: Secondary | ICD-10-CM | POA: Insufficient documentation

## 2011-03-16 NOTE — Assessment & Plan Note (Signed)
[facility]                         ELECTROPHYSIOLOGY OFFICE NOTE   ROLDAN, Charles Le                       MRN:          [id number]  DATE:03/07/2008                            DOB:          [birth year]    [name] is seen today.  He has hypertrophic cardiomyopathy with  previously implanted ICD.  He also has atrial flutter which is somewhat  atypical on the surface 12 lead and we have had difficulties getting him  therapeutically anticoagulated but he now comes in anticoagulated  appropriately.   His blood pressure is 143/88, pulse was 48.  His lungs were clear.  His  heart sounds were regular.  In fact, I could not see the flutter waves  in his neck, so we did a 12-lead confirming.  The extremities had no  edema.   I should mention that his medications include Coumadin and nadolol.   IMPRESSION:  1. Atrial flutter with typical and atypical features.  2. Hypertrophic cardiomyopathy.  3. Status post implantable cardioverter defibrillator for the above.   We will plan to bring Charles Le in for a ESI guided flutter ablation.     Charles Salvia, MD, [facility]  Electronically Signed    SCK/MedQ  DD: 03/07/2008  DT: 03/07/2008  Job #: [id number]

## 2011-03-16 NOTE — Op Note (Signed)
NAME:  Charles Le, Charles Le                ACCOUNT NO.:  [id number]   MEDICAL RECORD NO.:  [id number]          PATIENT TYPE:  OIB   LOCATION:  2853                         FACILITY:  [facility]   PHYSICIAN:  Duke Salvia, MD, FACCDATE OF BIRTH:  18-Feb-1956   DATE OF PROCEDURE:  DATE OF DISCHARGE:  03/18/2008                               OPERATIVE REPORT   PREOPERATIVE DIAGNOSES:  Hypertrophic cardiomyopathy, (?)  left  ventricular dysfunction, previously implanted dual-chamber ICD, now with  complete heart block, and recurrent atrial flutter.   POSTOPERATIVE DIAGNOSES:  Hypertrophic cardiomyopathy, (?)  left  ventricular dysfunction, previously implanted dual-chamber ICD, now with  complete heart block, and recurrent atrial flutter; left atrial flutter;  sinus rhythm.   PROCEDURE:  Invasive electrophysiological study with arrhythmia mapping  and direct current cardioversion.   Following obtaining informed consent, the patient was brought to the  electrophysiology laboratory and placed on the fluoroscopic table in  supine position.  After routine prep and drape, a cardiac  catheterization was performed with local anesthesia and conscious  sedation.  Noninvasive blood pressure monitoring, transcutaneous oxygen  saturation monitoring, and end-tidal CO2 monitoring were performed  continuously throughout the procedure.  Following the procedure, the  catheter was removed.  Hemostasis was obtained and the patient was  transferred to the holding area in stable condition.   Catheters of a 6-French octapolar catheter was inserted via the right  femoral vein to map the sites of the AV junction and then the high right  atrium.   Surface leads I and aVF and V1 were monitored continuously.  Following  insertion of the catheters, a stimulation protocol included incremental  atrial pacing.   END-TIDAL RESULTS:  End-tidal surface electrocardiogram:  Rhythm was atrial flutter.  AA interval was 206  milliseconds.  RR interval was device dependent.  QRS duration was 217 milliseconds; QT interval 541 milliseconds.   Final rhythm sinus; RR interval was device associated.   Arrhythmias; the patient presented to the lab with his previously  identified atrial flutter.  The flutter waves were upright in lead V1,  but while they were negative in leads 2, 3, and F, the predominant  positive peak was two-thirds of the way from nadir to nadir as opposed  to the typical flutter where it is about 25% from nadir to nadir.   Initial approach was to place the octapolar catheter in the coronary  sinus, this turned out to be very difficult because of the size of the  right atrium.  We then placed a catheter on the crista terminalis and  did entrainment mapping.  We were able to __________ with a cycle length  of 190 milliseconds.  The post pacing interval, however, was 420  milliseconds or 220 milliseconds greater than tachycardia cycle lengths  demonstrating [place] being very remote from the tachycardia, was certainly  left-sided.  That being the case, the patient was then submitted for  attempts at rapid atrial overdrive pacing, which were unsuccessful;  atrial fibrillation ensued.  The patient was then deeply sedated for  cardioversion and a 36 joules shock was  delivered through a measured  resistance of 46 ohms terminating the atrial flutter and restoring sinus  rhythm.  The patient's device was programmed accordingly with DDD 60 -  115.   The patient will be discharged to home, submitted for reassessment of  echo, consideration of left ventricular lead placement, and  consideration for left atrial flutter ablation with Dr. Johney Frame.      Duke Salvia, MD, [facility]  Electronically Signed     SCK/MEDQ  D:  03/18/2008  T:  03/18/2008  Job:  [id number]   cc:   Electrophysiology Laboratory  [facility]

## 2011-03-16 NOTE — Assessment & Plan Note (Signed)
[facility]                         ELECTROPHYSIOLOGY OFFICE NOTE   CHAMPION, CORALES                       MRN:          [id number]  DATE:03/28/2007                            DOB:          17-Dec-1955    HISTORY OF PRESENT ILLNESS:  Mr. Bucklin is seen.  He has hypertrophic  cardiomyopathy and intermittent complete heart block and syncope, and is  now device dependent.   He describes significant fatigue since the up titration of his Inderal.   He denies snoring.   PHYSICAL EXAMINATION:  VITAL SIGNS:  Blood pressure 114/74, pulse 49.  LUNGS:  Clear.  HEART:  Sounds were regular.  EXTREMITIES:  Without edema.   Interrogation of his St. Jude Atlas ICD demonstrates a P wave of 3 with  impedance of 490 and threshold of 0.75 and 0.4.  There was no intrinsic  ventricular rhythm.  Impedance was 570, threshold was 0.75 and 0.4.  The  high voltage impedance was 46 ohms.  There were no intercurrent  episodes.   He is atrially paced 50% of the time which is discordant from my event  histogram chart.  He is programmed in the TD DDI mode at a lower rate  limit of 50.   IMPRESSION:  1. Hypertrophic cardiomyopathy.  2. Complete heart block.  3. Fatigue.  4. Chronotropic incompetence.   I inadvertently let the patient get away without appreciating his  chronotropic incompetence.  Will need to reprogram his device and  activate rate response.  I have also decreased his beta blocker though  from Inderal 80 t.i.d. to __________ 40 daily and we will see how it is  that he tolerates this.   He is supposed to let us know within the next couple of weeks how he is  doing.  We will see him again in three months.     Duke Salvia, MD, [facility]  Electronically Signed    SCK/MedQ  DD: 03/28/2007  DT: 03/28/2007  Job #: [id number]

## 2011-03-16 NOTE — Letter (Signed)
July 30, 2008    Charles Salvia, MD, [facility]  [address]  [address]  [place], [place] [address]   RE:  Charles Le  MRN:  [id number]  /  DOB:  07-28-1956   Dear Charles Le:   It was my pleasure to see your patient Charles Le today in  Electrophysiology Clinic.  As you are aware, he is very pleasant 55-year-  old gentleman with hypertrophic cardiomyopathy.  He has atypical atrial  flutter, which has been present for several years.  He has previously  been cardioverted on 2 separate episodes.  He reports associated  symptoms of shortness of breath, palpitations, diaphoresis, and fatigue.  His last symptomatic episode was approximately 2 years ago.  The patient  has had several subsequent episodes with minimal symptoms.  He was  evaluated by you in May of 2009 and underwent EP study.  At that time,  he was found to have atypical flutter with a cycle length of 206  milliseconds.  Entrainment mapping was performed within the right atrium  and a long test pacing interval was noted.  The patient was therefore  felt to have a left-sided atrial flutter.  He was cardioverted to sinus  rhythm.  He has a dual chamber defibrillator with underlying complete  heart block.  We have interrogated his defibrillator today and it  reveals several very short episodes of atrial flutter with appropriate  mode switching.  He has had 6 episodes of mode switching since his last  interrogation April 24, 2008.  The maximum duration was 50 seconds with 5  of the 6 episodes lasting less than 14 seconds.  He is chronically  anticoagulated with Coumadin and otherwise doing well.  The patient  reports good exercise capacity presently.  He walks and does light  exercise frequently.  He is very pleased with his quality of life  presently and is otherwise without complaint.  He has had no difficulty  with bleeding with anticoagulation.   PAST MEDICAL HISTORY:  1. Hypertrophic cardiomyopathy (as above).  2. Atypical  atrial flutter (as above).  3. Status post implantation of a St. Jude Medical Atlas DR [id number]      (serial [id number]), dual chamber ICD implanted May 24, 2006.  4. Complete AV block.  5. Chronotropic incompetence.   ALLERGIES:  No known drug allergies.   CURRENT MEDICATIONS:  1. Multivitamin daily.  2. Nadolol 40 mg daily.  3. Coumadin 5 mg daily.  4. Doxycycline 100 mg b.i.d.   SOCIAL HISTORY:  The patient lives in [place].  He continues to smoke  less than 1 pack per day.   FAMILY HISTORY:  The patient's paternal grandmother had hypertrophic  cardiomyopathy.  He also has multiple cousins with hypertrophic  cardiomyopathy.  He is unaware of any history of sudden cardiac death in  the family.   REVIEW OF SYSTEMS:  The patient reports having a left forearm cellulitis  several days ago, for which he has been treated with intravenous and  currently oral antibiotics with good success.  He denies fevers or  chills.  All systems are reviewed and are otherwise negative.   PHYSICAL EXAMINATION:  VITAL SIGNS:  Blood pressure 119/82, heart rate  60, respirations 18, weight 220 pounds.  GENERAL:  The patient is a well-appearing male, in no acute distress.  He is alert and oriented x3.  HEENT:  Normocephalic and atraumatic.  Sclerae clear.  Conjunctivae  pink.  Oropharynx clear.  NECK:  Supple.  No JVD, lymphadenopathy, or bruits.  LUNGS:  Clear to auscultation bilaterally.  HEART:  Regular rate and rhythm, 2/6 systolic ejection murmur.  GI:  Soft, nontender, nondistended.  Positive bowel sounds.  EXTREMITIES:  No clubbing, cyanosis, or edema.  NEUROLOGIC:  Strength and sensation are intact.  SKIN:  No ecchymosis or lacerations.  The patient's left forearm  cellulitis appears to have healed.  MUSCULOSKELETAL:  No deformity or atrophy.  PSYCH:  Euthymic mood.  Full affect.   EKG today reveals AV sequential pacing.   Device interrogation.  The patient's dual chamber ICD is  interrogated  today and found to be functioning appropriately in the DDDR pacing mode  with low rate limit of 60 beats per minute and maximum tracking rate of  115 beats per minute.  The patient has had several mode switching  episodes as described in the HPI above.  The patient's P wave measure 3  millivolts with an atrial lead impendence of 490 ohms and a threshold of  0.5 volts at 0.5 milliseconds.  There is no underlying R wave at 30  beats per minute.  The RV pacing impendence was 550 ohms with a  threshold of 0.75 volts at 0.5 milliseconds.  The battery voltage is 3.1  volts.  No tachycardias have been detected and no device therapies have  been delivered.   IMPRESSION:  Charles Le is a very pleasant 55 year old gentleman with  hypertrophic cardiomyopathy and atypical atrial flutter who presents  today for further discussion of atypical atrial flutter.  At the present  time, the patient appears to be very asymptomatic with this atrial  flutter, which appears to be infrequent and of short duration.  He is  tolerating nadolol and his rate is well controlled.  He is also  tolerating Coumadin therapy.   PLAN:  Therapeutic strategies for atypical atrial flutter including both  medicine and catheter-based therapies were discussed in detail today.  The risk, benefits, and alternatives to EP study and radiofrequency  ablation for atypical atrial flutter were also discussed in detail.  Presently, the patient wishes to continue his current strategy of  nadolol and chronic anticoagulation with Coumadin.  Should he develop  recurrent, sustained, atypical atrial flutter, he would like to proceed  with catheter ablation.  I believe that this is a good strategy.  I  think that should he develop sustained atypical atrial flutter, we  should go ahead and proceed directly to catheter ablation rather than  cardioversion or other strategies.  He will follow up in your clinic in  6 months for routine  followup.    Sincerely,      Charles Range, MD  Electronically Signed    JA/MedQ  DD: 07/30/2008  DT: 07/31/2008  Job #: [id number]

## 2011-03-16 NOTE — Assessment & Plan Note (Signed)
[facility]                         ELECTROPHYSIOLOGY OFFICE NOTE   Charles Le, Charles Le                       MRN:          [id number]  DATE:12/20/2007                            DOB:          08-23-1956    [name] is seen in follow-up for his ICD implanted in the setting of  hypertrophic cardiomyopathy, LV dysfunction and high-risk features  including syncope and abnormal vasomotor response.   He comes in complaining of shortness of breath and fatigue.  He dates  this back to when he was first started on Inderal.  He is unaware of  palpitations.   His current medications are her now the Nadolol 40.   He has no known allergies.   On examination, his blood pressure 119/79, his pulse was 49.  His weight  was 237.  His lungs were clear.  His heart sounds were regular without  outflow murmur.  Extremities were without edema.   Interrogation of his St. Jude ICD demonstrates:  1. Near device dependence.  2. Chronotropic incompetence with greater than 95% of his heart beats      at less than 55 beats per minute (device was programmed at 50).  3. Atrial flutter with atrial sensed events comprising 55% of his      beats as compared with previous interrogations where he was atrial      paced 50% of the time without evidence of atrial fibrillation.      Internal electrogram consistent with atrial flutter with a cycle      length of 220 milliseconds.   Electrocardiogram is consistent with a isthmus- dependent flutter with a  somewhat unusual flutter waves in the inferior leads and that the  plateau is really truncated. The flutter wave is upright in lead V1.   IMPRESSION:  1. Hypertrophic cardiomyopathy with high-risk features including      syncope and abnormal vasomotor response.  2. Status post ICD for the above.  3. Complete heart block.  4. Atrial flutter - new with a antecedent history of some atrial      arrhythmias for which he was treated  with Coumadin and      cardioversion by Dr. Amil Amen, for which records are not available.  5. Chronotropic incompetence.   [name] has a variety of issues that require attention.  The first is  chronotropic incompetence.  The second is a recurrence of his atrial  arrhythmia and its associated thromboembolic risk issues as well as  potential contribution to exercise intolerance.   We will plan to:  1. Resume Coumadin to anticipate EP study and catheter ablation in 4-5      weeks time.  (Following his trip to [place])  2. Activate rate response to improve chronotropic competence.   I have reviewed the above with him, including potential benefits as well  as the risks of procedure, including but not limited to death,  perforation, vascular injury, and he understands these risks and is  willing to proceed.     Duke Salvia, MD, [facility]  Electronically Signed    SCK/MedQ  DD:  12/20/2007  DT: 12/20/2007  Job #: [id number]

## 2011-03-16 NOTE — Assessment & Plan Note (Signed)
[facility]                         ELECTROPHYSIOLOGY OFFICE NOTE   Charles, Le                       MRN:          [id number]  DATE:04/24/2008                            DOB:          1956/08/27    Charles Le is seen following an EP study at which he was found to have a  left atrial flutter causing the symptoms.  He has hypertrophic  cardiomyopathy, LV dysfunction, and a previously implanted dual-chamber  ICD with intercurrent occurrence of complete heart block.   He is feeling much better in sinus rhythm, as well as with activation of  his rate response.  He is on Coumadin.   His other medications include nadolol 40.   On examination, his blood pressure was 120/80 with a pulse of 60.  His  lungs were clear.  Heart sounds were regular.  Extremities were without  edema.   Interrogation of St. Jude pulse generator demonstrated that he was  having far-field oversensing that was addressed by reprogramming his  PVAB.  He was also having appropriate PMT.  His rate response, however,  was pretty good.   IMPRESSION:  1. Atrial flutter - left sided.  2. Hypertrophic cardiomyopathy.  3. Complete heart block.  4. Left ventricular dysfunction, now resolved?  I need to confirm      this.   We will plan to continue Charles Le on his current therapies.  I have  arranged for him to come back and see Dr. Johney Frame in September for  consideration of a left atrial flutter procedure.   In the interim, we will continue him on his Coumadin.     Duke Salvia, MD, [facility]  Electronically Signed    SCK/MedQ  DD: 04/24/2008  DT: 04/25/2008  Job #: [id number]

## 2011-03-16 NOTE — Discharge Summary (Signed)
NAME:  ADEMIDE, SCHABERG NO.:  [id number]   MEDICAL RECORD NO.:  [id number]          PATIENT TYPE:  OIB   LOCATION:  2852                         FACILITY:  [facility]   PHYSICIAN:  Duke Salvia, MD, FACCDATE OF BIRTH:  November 23, 1955   DATE OF ADMISSION:  02-03-08  DATE OF DISCHARGE:  February 03, 2008                               DISCHARGE SUMMARY   FINAL DIAGNOSES:  1. Atrial flutter.  Presenting for transesophageal      echocardiogram/electrophysiology study/radiofrequency      catheterization of atypical atrial flutter.  2. Difficult to maintain the patient at stable, therapeutic INR.  3. INR 1.6 on presentation, 02/03/08.  4. The patient would need subcutaneous Lovenox after the procedure and      the patient without insurance to pay for this.   SECONDARY DIAGNOSES:  1. Hypotrophic cardiomyopathy, status post cardioverter-defibrillator.  2. Known atrial flutter with symptoms of fatigue and dyspnea on      exertion.  3. Chronotropic incompetence.  4. No atrial fibrillation.  5. Complete heart block.   PROCEDURE:  Planned transesophageal echocardiogram/radiofrequency  catheterization and typical atrial flutter, cancelled.   MEDICATIONS:  1. Nadolol 40 mg daily.  2. Coumadin 5 mg tablets 1 tablet daily except 2.5 mg every Monday or      as directed by the Coumadin Clinic.   PLAN:  Going forward, the patient discharged today without the procedure  on 02/03/2023.  He will follow up in Coumadin Clinic, Wednesday, April 1  at 2 o'clock, and he will have successive office visits for the Coumadin  Clinic, April 8th and then April 15th.  He has an office visit with Loura Pardon, physician assistant, Thursday, April 16th at 2 o'clock at  [facility].  Other laboratory studies this admission drawn on  2024-04-04sodium 136, potassium is 4, chloride 103, carbonate 20,  glucose 96, BUN is 8, and creatinine 1.14.  Complete blood count on  04-Apr-2024white cells  13.5, hemoglobin 18.2, hematocrit 32.1, and  platelets 200.  Once again, the plan would be to  have Coumadin therapeutic for a 3- to 4-week period and then present to  office of [facility], Thursday, April 16th at 2 o'clock and then  set up for a repeat trial, electrophysiology study, radiofrequency,  catheterization of atrial flutter on April 20th with Dr. Graciela Husbands.      Maple Mirza, [place]      Duke Salvia, MD, [facility]  Electronically Signed    GM/MEDQ  D:  February 03, 2008  T:  02/03/08  Job:  [id number]

## 2011-03-16 NOTE — Assessment & Plan Note (Signed)
[facility]                         ELECTROPHYSIOLOGY OFFICE NOTE   [name], [name]                       MRN:          [id number]  DATE:03/07/2008                            DOB:          December 12, 1955    Charles Le is seen in followup for atrial flutter in the setting of  hypertrophic cardiomyopathy and previously implanted ICD.  He was going  to   CANCELED DICTATION     Duke Salvia, MD, [facility]     SCK/MedQ  DD: 03/07/2008  DT: 03/07/2008  Job #: [id number]

## 2011-03-19 ENCOUNTER — Encounter: Payer: Self-pay | Admitting: Physical Therapy

## 2011-03-19 NOTE — Op Note (Signed)
NAME:  [name], [name]                ACCOUNT NO.:  [id number]   MEDICAL RECORD NO.:  [id number]          PATIENT TYPE:  OIB   LOCATION:  2899                         FACILITY:  [facility]   PHYSICIAN:  Francisca December, M.D.  DATE OF BIRTH:  1956-10-21   DATE OF PROCEDURE:  05/21/2005  DATE OF DISCHARGE:                                 OPERATIVE REPORT   PROCEDURE PERFORMED:  Elective direct current cardioversion.   INDICATION:  Charles Le is a 55 year old man with known hypertrophic  cardiomyopathy.  He has developed recently minimally symptomatic atrial  fib/flutter.  He has been anticoagulated now for four weeks.  He is to  undergo elective cardioversion at this time.   PROCEDURAL NOTE:  While monitoring heart rate, blood pressure, O2 saturation  and ECG, the patient received 250 mg of IV Pentothal under the direction of  Dr. Sheldon Silvan of the anesthesia department.  Following establishment of  deep anesthesia, the patient received a single dose of transthoracic energy  of 100 joules synchronized biphasic.  This resulted in prompt return of  sinus rhythm.   IMPRESSION:  1.  Successful elective cardioversion, atrial flutter to sinus rhythm.  2.  The patient was systemically anticoagulated with an INR of 2.0 today.   PLAN:  1.  Continue warfarin x4 weeks.  2.  Continue propranolol indefinitely.  3.  Office visit in the near future within three to four weeks.       JHE/MEDQ  D:  05/21/2005  T:  05/21/2005  Job:  [id number]

## 2011-03-19 NOTE — Discharge Summary (Signed)
NAME:  Charles Le, Charles Le                ACCOUNT NO.:  [id number]   MEDICAL RECORD NO.:  [id number]          PATIENT TYPE:  INP   LOCATION:  2920                         FACILITY:  [facility]   PHYSICIAN:  Maple Mirza, P.A. DATE OF BIRTH:  Jul 30, 1956   DATE OF ADMISSION:  05/20/2006  DATE OF DISCHARGE:  05/25/2006                                 DISCHARGE SUMMARY   ALLERGIES:  This patient has no known drug allergies.   The time for exam and preparation of this dictation is 40 minutes.   PRINCIPAL DIAGNOSES:  1.  Admitted with syncope.  2.  Emergency room complete heart block with ventricular escape at 20 beats      per minute.  3.  Symptomatic bradycardia with pauses after PVCs.  4.  History of syncope in 1995 with identification of hypertrophic      obstructive cardiomyopathy at that time.  5.  Discharging day one status post implantation of St. Jude ATLAS      cardioverter defibrillator with long AV delay to minimize ventricular      pacing.   SECONDARY DIAGNOSES:  1.  History of atrial fibrillation/flutter status post DC cardioversion May 21, 2005.  The patient was in atrial flutter on electrocardiogram May 17, 2005.  2.  Hypertrophic obstructive cardiomyopathy.  The patient has at least three      cousins with history of spells.  Material grandmother had HOCM and was      found dead on the kitchen floor.  3.  Gastroesophageal reflux disease.   PROCEDURES:  1.  Exercise treadmill study showing increase in blood pressure with      exercise.  Blood pressure systolic 140 to 168.  2.  May 24, 2006 implantation of St. Jude ATLAS plus DR cardioverter      defibrillator.  This is a dual-chamber pacemaker with defibrillator      threshold study of less than or equal to 20 joules.  The patient will      resume Inderal long-acting 120 mg b.i.d.  3.  2D echocardiogram on May 20, 2006.  Ejection fraction is estimated at      60%.  There were no left ventricular regional  wall motion abnormalities.      There was Doppler evidence for dynamic.  Left ventricular outflow      trapped obstruction at rest.  Peak velocity is 3.1 mm per second with a      peak gradient of 38 mmHg.   BRIEF HISTORY:  [name] is a 55 year old male.  He has a history of  syncope in 1995.  Cardiac workup at that time uncovered hypertrophic  obstructive cardiomyopathy.  In July 2006 he underwent DC cardioversion  under propranolol for mildly symptomatic atrial fibrillation/flutter.  He  says he has never noted palpitations in the past.   Lately he is not having any history of dizziness or recurrent syncope until  last evening, the evening of May 19, 2006.  He is living in a trailer with  his mother.  Their air conditioning was off for the last three days.  He had  been taking propranolol for his atrial fibrillation flutter.  At 3:00 in the  morning on May 20, 2006, he was watching TV in the heat.  He had earlier  had a hassle with the heating and air conditioning person, was under a  little stress as he is currently unemployed as well.  He started feeling  funny, watching TV.  He went out on the deck to get some fresh air.  His  mother was with him at that time.  Suddenly sitting on the deck she relates  that his eyes rolled back and he collapsed on the deck.  He was out for  about five to ten seconds.  The patient was surprised to find himself lying  on the deck and he was drenched in sweat.  He was admitted to the emergency  room.  Electrocardiogram at 5:23 the morning of May 20, 2006 shows complete  heart block with escape of heart rate in the 20s.  By 5:47 the heart rate  had increased to 76 beats per minute.  His propranolol was held.  He was  admitted to intensive care and electrocardiogram showing right bundle branch  block, left anterior vesicular block, and first-degree AV block.  The  patient is also having frequent PVCs with pauses.  His transcutaneous pacer  is  in place which are taking up the rhythm slack after PVCs.  The idea is to  have electrophysiology consult and to told his propranolol.  There was no  evidence of aortic stenosis, no significant aortic valvular regurgitation.  There was mild thickening of the mitral valve including the anterior and  posterior leaflets without significant restriction of leaflet motion.  There  was mild mitral regurgitation.  Wall thickness at echocardiogram is normal,  greater than 30.  He has sudden cardiac death risk factors which include  syncope.  However, family history of sudden cardiac death is not applicable.  Wall thickness by echocardiogram is not applicable nor is non-sustained  ventricular tachycardia applicable in this case.  The patient was assessed  for the risk of sudden cardiac death by Dr. Graciela Husbands and, with inclusion of  exercise treadmill study, was found to be a candidate for an ICD  implantation.  This was done May 24, 2006.  The patient had no complaints  after the procedure except for some lingering tenderness which requires  analgesic control.  At the time of discharge he is in sinus rhythm although,  as Dr. Graciela Husbands noted, he will require atrial flutter ablation some time in the  future.  Chest x-ray post procedure has been checked and shows no  pneumothorax.  It shows cardiomegaly.  No edema with appropriate placement  of leads.  The patient is asked not to drive until he sees Dr. Graciela Husbands in the  office which is Tuesday, September 13, 2006 at 12:10.  He is asked to keep  his incision dry for the next seven days and to sponge bathe until Tuesday,  May 31, 2006.   DISCHARGE MEDICATIONS:  Include  1.  Inderal 80 mg three times daily to be purchased at [facility], if possible,      for their discount savings price.  2.  Percocet 5/325 one to two tabs q.4h.-q.6h. as needed.   FOLLOW UP: 1.  He follows up at [facility] [address].  2.  ICD clinic Thursday June 16, 2006  at  9.  3.  He will see Dr. Graciela Husbands Tuesday, September 13, 2006 at 12:10 once again to      discuss possible atrial flutter ablation.      Maple Mirza, P.A.     GM/MEDQ  D:  05/25/2006  T:  05/25/2006  Job:  [id number]   cc:   Duke Salvia, M.D.  [address]  [address]  [place]  [place] [address]

## 2011-03-19 NOTE — Op Note (Signed)
NAME:  Charles Le, Charles Le                ACCOUNT NO.:  [id number]   MEDICAL RECORD NO.:  [id number]          PATIENT TYPE:  INP   LOCATION:  2920                         FACILITY:  [facility]   PHYSICIAN:  Duke Salvia, M.D.  DATE OF BIRTH:  18-Jan-1956   DATE OF PROCEDURE:  05/23/2006  DATE OF DISCHARGE:                                 OPERATIVE REPORT   PREOPERATIVE DIAGNOSIS:  Hypertrophic obstructive cardiomyopathy with a  history of syncope.   POSTOPERATIVE DIAGNOSIS:  Abnormal vasomotor response.   The patient was submitted to a standard Bruce protocol.  Initial supine  blood pressure was 144/81.  Initial exercise blood pressure was 166/84.  At  1 minute exercise, the blood pressure had fallen to 158/84.  Subsequent  blood pressures in each ensuing minute continued to fall into the low 150s.  Immediate post-exercise blood pressure was 161 with a subsequent post-  exercise blood pressure of 170-some-odd.   IMPRESSION:  Abnormal vasomotor response.           ______________________________  Duke Salvia, M.D.     SCK/MEDQ  D:  05/23/2006  T:  05/23/2006  Job:  [id number]   cc:   Francisca December, M.D.  Fax: [phone]   Electrophysiology Laboratory

## 2011-03-19 NOTE — Letter (Signed)
September 02, 2008     RE:  Charles Le, Charles Le  MRN:  [id number]  /  DOB:  Mar 27, 1956   To Whom It May Concern:   This letter is in support of [name]' application.  He has a  congenital heart problem classified as hypertrophic cardiomyopathy.  He  has complete AV block and is status post dual-chamber defibrillator  implantation for high risk features for sudden cardiac death.  He has  also developed atypical atrial flutters which are felt to the left  atrial in origin.   He is currently treated with a complex medical regime for the above.   I should note further that his overall left ventricular systolic  function is normal.  He does have mild mitral regurgitation, systolic  anterior motion and a dynamic outflow gradient.   If there is anything further I can do, please do not hesitate to contact  me.    Sincerely,      Duke Salvia, MD, [facility]  Electronically Signed    SCK/MedQ  DD: 09/02/2008  DT: 09/02/2008  Job #: [id number]

## 2011-03-19 NOTE — Assessment & Plan Note (Signed)
Charles Le, Charles Le                       MRN:          [id number]  DATE:10/28/2006                            DOB:          30-Oct-1956    Charles Le is seen.  He was initially seen in consultation for Dr.  Amil Amen because of hypertrophic cardiomyopathy.  He had an abnormal  vasomotor response and underwent ICD implantation because of the  combination of those things and an antecedent history of syncope with  complete heart block.   He is doing quite well.   His medications currently include Inderal 80 b.i.d.   On examination, his blood pressure is 110/76 with a pulse of 54.  Lungs  were clear, heart sounds were regular.  Interrogation of his St. Jude  defibrillator demonstrates a P wave of greater than 3 with impedance of  490, threshold of 0.75 at 0.5.  The R wave was 12 with impedance of 570  and threshold of 0.75 at 0.5.  The device was reprogrammed.   IMPRESSION:  1. Hypertrophic cardiomyopathy with:      a.     Syncope.      b.     Abnormal vasomotor response.  2. Complete heart block.  3. History of atrial flutter.  4. Gastroesophageal reflux disease.  5. Depression.   Charles Le is stable from an arrhythmia point of view.  I have encouraged  him to pursue work actively; a sedentary job should be acceptable with  his very serious cardiac conditions.   We will see him in 3-4 months' time.     Duke Salvia, MD, [facility]  Electronically Signed    SCK/MedQ  DD: 10/28/2006  DT: 10/28/2006  Job #: [id number]

## 2011-03-19 NOTE — Assessment & Plan Note (Signed)
[facility]                           ELECTROPHYSIOLOGY OFFICE NOTE   PHAROAH, GOGGINS                       MRN:          [id number]  DATE:06/16/2006                            DOB:          11/21/1955    Charles Le is seen in the Device Clinic today, June 16, 2006 for post-op  follow up of his recently implanted Cooter. Jude Atlas [id number] dual chamber  defibrillator implanted May 24, 2006 for_________ by Dr. Graciela Husbands.   Upon exam, site looks good without redness or swelling. Steri-strips were  removed without incident. Upon interrogation battery voltage is 3.2 volts  with the last recorded charge time of 10.5 seconds.  In the atrium intrinsic  amplitude is greater than 3 millivolts with an impedence of 465 ohms and  threshold of 0.5 volts, 0.5 milliseconds.  In the right ventricle intrinsic  amplitude is greater than 12 seconds with an impedence of 510 ohms and  threshold of 0.75 volts, 0.5 milliseconds.  No programming changes were made  at this time.  Mr. Longoria will be seen in November for follow up by Dr.  Graciela Husbands.                                   Cleatrice Burke, RN   CF/MedQ  DD:  06/16/2006  DT:  06/16/2006  Job #:  [id number]

## 2011-03-19 NOTE — Op Note (Signed)
NAME:  Charles Le, Charles Le                ACCOUNT NO.:  [id number]   MEDICAL RECORD NO.:  [id number]          PATIENT TYPE:  INP   LOCATION:  2920                         FACILITY:  [facility]   PHYSICIAN:  Duke Salvia, M.D.  DATE OF BIRTH:  1956-02-15   DATE OF PROCEDURE:  DATE OF DISCHARGE:                                 OPERATIVE REPORT   PREOPERATIVE. DIAGNOSIS:  Hypertrophic cardiomyopathy; intermittent complete  heart block; syncope and vasomotor instability.   POSTOPERATIVE DIAGNOSIS:  Hypertrophic cardiomyopathy; intermittent complete  heart block; syncope and vasomotor instability.   PROCEDURE:  Dual chamber defibrillator implantation with defibrillator  threshold testing.   PROCEDURE IN DETAIL:  After obtained informed consent, the patient was  brought to the electrophysiology laboratory and placed on the fluoroscopic  table in the supine position.  After routine prep and drape of the left  upper chest, Lidocaine was infiltrated in prepectoral subclavicular region.  An incision was made and carried down to the prepectoral fashion with  electrocautery and sharp dissection.  A pocket was formed summarily.  Hemostasis was obtained.   Thereafter attention was turned to gain access to the left subclavian vein  which was accomplished without difficulty, without the aspiration or  puncture of the artery.  Two separate venipunctures were accomplished.  Guidewire was replaced and retained.   Subsequently an 8 French hemostat introducer sheath was placed through which  was then passed a St. Jude 7001 dual core __________  defibrillator leads,  serial number [id number].  Under fluoroscopic guidance it was manipulated to  the right ventricular apex where the bipolar R wave about 18 millivolts and  the pacemaker threshold is about 0.75 volts and 0.5 milliseconds.  The  impedence was about 1200 ohms and the current of injury was striking.  This  lead was secured to the prepectoral fashion  and a Medtronic 576 52-cm atrial  lead serial number [id number].  Lead was passed under fluoroscopic guidance  to the right atrial appendage where the bipolar R wave was greater than 5  millivolts and the patient's impedence of about 1.25 volts and 0.5  milliseconds and the impedence was 1300 ohms with a very profound current of  injury.   These leads were then secured to the prepectoral fascia.  The pocket was  copiously irrigated with antibiotic containing solution.  Hemostasis was  ensured and all leads were then attached to a St. Jude Allis V243 high  energy defibrillator, serial number [id number].  Through the device the bipolar  P wave was greater than 3, the bipolar R wave was greater than 12.  The AM  impedence was 900 ohms with impedence threshold of 1.75 volts after 1  millisecond and the LV impedence was 740 ohms with a threshold of 0.75  volts.  The high voltage impedence was 40 ohms.   Ventricular fibrillation was then induced via T wave shock for a total  duration of 7.5 seconds at 25 with low shock measured resistance at 44 ohms.  Terminated ventricular fibrillation restoring sinus rhythm.   After a wait of five minutes, ventricular fibrillation was  induced via T  wave shock.  After a total duration of 6.5 seconds a 20 joule shock was  delivered through measured resistance, 46 ohms, a sinus rhythm was restored.   At this point the device was implanted.  Hemostasis was ensured.  The leads  and the pulse generator having been secured to the preperitoneal fascia.  The wound was then closed in three layers in normal fashion.  The wound was  washed, dried and a benzoin and Steri-Strip dressing was applied.  Needle  counts, sponge counts and instrument counts were correct at the end of the  procedure according to the staff.  The patient tolerated the procedure  without apparent complication.           ______________________________  Duke Salvia, M.D.     SCK/MEDQ  D:   05/24/2006  T:  05/25/2006  Job:  [id number]   cc:   Francisca December, M.D.  Fax: [phone]   Electrophysiology Laboratory   ____ Pacemaker Clinic

## 2011-03-23 ENCOUNTER — Ambulatory Visit: Payer: Self-pay | Admitting: Physical Therapy

## 2011-03-25 ENCOUNTER — Ambulatory Visit: Payer: Self-pay | Admitting: Rehabilitation

## 2011-03-30 ENCOUNTER — Encounter: Payer: Self-pay | Admitting: Family Medicine

## 2011-03-30 ENCOUNTER — Ambulatory Visit (INDEPENDENT_AMBULATORY_CARE_PROVIDER_SITE_OTHER): Payer: Self-pay | Admitting: Family Medicine

## 2011-03-30 VITALS — BP 135/89 | HR 70 | Temp 98.0°F | Ht 70.0 in | Wt 240.0 lb

## 2011-03-30 DIAGNOSIS — M25511 Pain in right shoulder: Secondary | ICD-10-CM

## 2011-03-30 DIAGNOSIS — M25519 Pain in unspecified shoulder: Secondary | ICD-10-CM

## 2011-03-30 NOTE — Patient Instructions (Signed)
Your rotator cuff strain is significantly better. Avoid painful activities as much as possible. You can stop physical therapy. Follow up with me as needed.

## 2011-03-31 ENCOUNTER — Encounter: Payer: Self-pay | Admitting: Family Medicine

## 2011-03-31 NOTE — Assessment & Plan Note (Signed)
2/2 rotator cuff strain.  Significantly improved.  Continue with HEP for next 3 weeks to maintain strength in shoulder.  Discontinue sling and medications.  F/u prn.

## 2011-03-31 NOTE — Progress Notes (Signed)
Subjective:    Patient ID: Charles Le, male    DOB: 1956-05-21, 55 y.o.   MRN: [id number]  HPI   55 yo M here for 3 week f/u right shoulder pain  Last OV: Patient reports that on 5/2 his girlfriend hyperextended his right arm when he was standing after they were 'goofing around' States he felt pain and a pop within his right shoulder. Immediately after could fully move his right arm but was painful to go overhead. Went to ED and had x-rays showing mild AC DJD but otherwise negative. Given sling which he has been using. No radiation of pain down arm. No numbness or tingling + night pain. No neck pain. Is right handed No prior right shoulder injuries/surgeries. Taking oxycodone from ED.  Advised against taking NSAIDs with his h/o heart disease. Diagnosed with rotator cuff strain after ultrasound at last visit.  Today: Patient has been going to PT past 3 weeks and now transitioned to home exercises. Significantly improved with pain now rare and only with reaching far behind and occasionally overhead. No longer requiring pain medication.  Past Medical History  Diagnosis Date  . Hypertrophic obstructive cardiomyopathy     hypertrophic  . Aflutter     atypical-left sided  . DDD-ICD     [facility] Atlas DR [phone] ) dual chamber ICD implanted May 24, 2006)  . Atrioventricular block, complete     complete    Current Outpatient Prescriptions on File Prior to Visit  Medication Sig Dispense Refill  . esomeprazole (NEXIUM) 40 MG capsule Take 1 capsule (40 mg total) by mouth daily before breakfast.  30 capsule  6  . rivaroxaban (XARELTO) 10 MG TABS tablet Take 1 tablet (10 mg total) by mouth daily.  30 tablet  6    Past Surgical History  Procedure Date  . Hip surgery     replacement  . Icd implantation     ICD- St Jude    No Known Allergies  History   Social History  . Marital Status: Single    Spouse Name: N/A    Number of Children: N/A  . Years of  Education: N/A   Occupational History  . Not on file.   Social History Main Topics  . Smoking status: Current Everyday Smoker -- 0.5 packs/day for 18 years    Types: Cigarettes  . Smokeless tobacco: Never Used  . Alcohol Use: Not on file  . Drug Use: Not on file  . Sexually Active: Not on file   Other Topics Concern  . Not on file   Social History Narrative  . No narrative on file    Family History  Problem Relation Age of Onset  . Hypertrophic cardiomyopathy Paternal Grandmother   . Diabetes Paternal Grandmother   . Heart attack Father   . Diabetes Father   . Hypertension Neg Hx     BP 135/89  Pulse 70  Temp(Src) 98 F (36.7 C) (Oral)  Ht 5\' 10"  (1.778 m)  Wt 240 lb (108.863 kg)  BMI 34.44 kg/m2  Review of Systems  See HPI above.    Objective:   Physical Exam  Gen: NAD R shoulder: No Gross deformity, swelling, bruising. No focal TTP at [facility] joint, biceps tendon, elsewhere about shoulder. FROM with negative painful arc. Negative drop arm sign Negative hawkins, negative neers. Negative speeds, yergasons Strength 5/5 with empty can, 5/5 with resisted IR/ER without pain. Negative apprehension.     Assessment & Plan:  1. Right shoulder pain - 2/2 rotator cuff strain.  Significantly improved.  Continue with HEP for next 3 weeks to maintain strength in shoulder.  Discontinue sling and medications.  F/u prn.

## 2011-04-29 ENCOUNTER — Encounter: Payer: Self-pay | Admitting: *Deleted

## 2011-05-04 ENCOUNTER — Encounter: Payer: Self-pay | Admitting: *Deleted

## 2011-05-14 ENCOUNTER — Other Ambulatory Visit: Payer: Self-pay | Admitting: Internal Medicine

## 2011-05-14 ENCOUNTER — Ambulatory Visit (INDEPENDENT_AMBULATORY_CARE_PROVIDER_SITE_OTHER): Payer: Medicaid Other | Admitting: *Deleted

## 2011-05-14 DIAGNOSIS — Z95810 Presence of automatic (implantable) cardiac defibrillator: Secondary | ICD-10-CM

## 2011-05-14 DIAGNOSIS — I442 Atrioventricular block, complete: Secondary | ICD-10-CM

## 2011-05-21 LAB — REMOTE ICD DEVICE
AL IMPEDENCE ICD: 415 Ohm
BAMS-0001: 150 {beats}/min
BAMS-0003: 70 {beats}/min
BATTERY VOLTAGE: 2.55 V
BRDY-0002RA: 60 {beats}/min
CHARGE TIME: 13.5 s
RV LEAD IMPEDENCE ICD: 520 Ohm
TOT-0009: 1
TOT-0010: 42

## 2011-05-26 ENCOUNTER — Encounter: Payer: Self-pay | Admitting: *Deleted

## 2011-05-26 NOTE — Progress Notes (Signed)
icd remote check  

## 2011-07-16 ENCOUNTER — Telehealth: Payer: Self-pay | Admitting: *Deleted

## 2011-07-16 DIAGNOSIS — T82198A Other mechanical complication of other cardiac electronic device, initial encounter: Secondary | ICD-10-CM

## 2011-07-16 NOTE — Telephone Encounter (Signed)
Checking lead 

## 2011-07-26 LAB — CBC
HCT: 52.1 — ABNORMAL HIGH
Hemoglobin: 18.2 — ABNORMAL HIGH
RBC: 5.49
WBC: 13.5 — ABNORMAL HIGH

## 2011-07-26 LAB — BASIC METABOLIC PANEL
GFR calc non Af Amer: >60
Potassium: 4
Sodium: 136

## 2011-07-26 LAB — APTT: aPTT: 36

## 2011-07-28 LAB — APTT: aPTT: 35

## 2011-07-28 LAB — PROTIME-INR: Prothrombin Time: 24.8 — ABNORMAL HIGH

## 2011-08-03 LAB — DIFFERENTIAL
Basophils Absolute: 0.1
Lymphocytes Relative: 30
Lymphs Abs: 1.9
Monocytes Absolute: 0.8
Neutro Abs: 3.2

## 2011-08-03 LAB — CBC
HCT: 48.9
Hemoglobin: 16.5
RDW: 13.9
WBC: 6.3

## 2011-08-03 LAB — SEDIMENTATION RATE: Sed Rate: 0

## 2011-08-05 ENCOUNTER — Encounter (INDEPENDENT_AMBULATORY_CARE_PROVIDER_SITE_OTHER): Payer: Self-pay | Admitting: Surgery

## 2011-08-16 ENCOUNTER — Ambulatory Visit (INDEPENDENT_AMBULATORY_CARE_PROVIDER_SITE_OTHER): Payer: Medicaid Other | Admitting: Surgery

## 2011-08-16 ENCOUNTER — Encounter (INDEPENDENT_AMBULATORY_CARE_PROVIDER_SITE_OTHER): Payer: Self-pay | Admitting: Surgery

## 2011-08-16 VITALS — BP 150/88 | HR 80 | Temp 97.2°F | Resp 14 | Ht 70.0 in | Wt 245.8 lb

## 2011-08-16 DIAGNOSIS — R1902 Left upper quadrant abdominal swelling, mass and lump: Secondary | ICD-10-CM | POA: Insufficient documentation

## 2011-08-16 NOTE — Progress Notes (Signed)
Chief Complaint  Patient presents with  . Other    Eval abdominal wall hernia    HPI Charles Le is a 55 y.o. male.   HPI This is a pleasant gentleman referred to me by Dr. Tracey Harries for evaluation of a potential ventral hernia. The patient noticed a mass in his left-sided abdominal wall above the umbilicus several months ago. It is virtually asymptomatic. He has minimal discomfort. He has no obstructive symptoms. He has had no previous abdominal surgery. Past Medical History  Diagnosis Date  . Hypertrophic obstructive cardiomyopathy     hypertrophic  . Aflutter     atypical-left sided  . DDD-ICD     [facility] Atlas DR [phone] ) dual chamber ICD implanted May 24, 2006)  . Atrioventricular block, complete     complete  . Hyperlipidemia   . Heart murmur   . Insomnia   . Anxiety     Past Surgical History  Procedure Date  . Hip surgery     replacement  . Icd implantation     ICD- St Jude    Family History  Problem Relation Age of Onset  . Hypertrophic cardiomyopathy Paternal Grandmother   . Diabetes Paternal Grandmother   . Heart attack Father   . Diabetes Father   . Hypertension Neg Hx   . Cancer Mother     breast  . Cancer Maternal Grandfather     prostate    Social History History  Substance Use Topics  . Smoking status: Current Everyday Smoker -- 0.5 packs/day for 18 years    Types: Cigarettes  . Smokeless tobacco: Never Used  . Alcohol Use: Yes     1 - 2 times per week    No Known Allergies  Current Outpatient Prescriptions  Medication Sig Dispense Refill  . clonazePAM (KLONOPIN) 0.5 MG tablet Take 0.5 mg by mouth as needed.        [name] esomeprazole (NEXIUM) 40 MG capsule Take 1 capsule (40 mg total) by mouth daily before breakfast.  30 capsule  6  . rivaroxaban (XARELTO) 10 MG TABS tablet Take 1 tablet (10 mg total) by mouth daily.  30 tablet  6    Review of Systems Review of Systems  Constitutional: Negative.   Eyes: Negative.     Respiratory: Negative.   Cardiovascular: Negative.   Gastrointestinal: Negative.   Genitourinary: Negative.   Neurological: Negative.   Hematological: Negative.   Psychiatric/Behavioral: Negative.     Blood pressure 150/88, pulse 80, temperature 97.2 F (36.2 C), temperature source Temporal, resp. rate 14, height 5\' 10"  (1.778 m), weight 245 lb 12.8 oz (111.494 kg).  Physical Exam Physical Exam  Constitutional: He is oriented to person, place, and time. He appears well-developed and well-nourished. No distress.  HENT:  Head: Normocephalic and atraumatic.  Right Ear: External ear normal.  Left Ear: External ear normal.  Nose: Nose normal.  Mouth/Throat: Oropharynx is clear and moist. No oropharyngeal exudate.  Eyes: Conjunctivae are normal. No scleral icterus.  Neck: Normal range of motion. Neck supple. No tracheal deviation present. No thyromegaly present.  Cardiovascular: Normal rate, regular rhythm and intact distal pulses.   Murmur heard. Pulmonary/Chest: Effort normal and breath sounds normal. No respiratory distress. He has no wheezes.  Abdominal: Soft. Bowel sounds are normal. He exhibits mass. He exhibits no distension. There is no tenderness. There is no rebound.       There is a 3-4 cm mass in the patient's left upper  quadrant just off the midline. It is nontender and mobile. I cannot feel a fascial defect. He has a very tiny umbilical hernia which is reducible  Musculoskeletal: Normal range of motion. He exhibits no edema and no tenderness.  Lymphadenopathy:    He has no cervical adenopathy.  Neurological: He is alert and oriented to person, place, and time.  Skin: Skin is warm and dry. No rash noted. No erythema.  Psychiatric: His behavior is normal. Judgment normal.    Data Reviewed   Assessment    Patient with a 3-4 cm abdominal wall mass of uncertain etiology. I cannot totally rule out that this is nonincarcerated hernia although given his symptoms and exam I  would favor a mass.    Plan    Given his significant cardiac history and anticoagulation medications, I'm going to get a CAT scan of his abdomen to evaluate this mass and to rule out a hernia prior to potential surgical intervention. He understands and agrees.       [name] A 08/16/2011, 4:15 PM

## 2011-08-20 ENCOUNTER — Encounter (INDEPENDENT_AMBULATORY_CARE_PROVIDER_SITE_OTHER): Payer: Self-pay | Admitting: Surgery

## 2011-08-20 ENCOUNTER — Other Ambulatory Visit (INDEPENDENT_AMBULATORY_CARE_PROVIDER_SITE_OTHER): Payer: Self-pay | Admitting: Surgery

## 2011-08-20 ENCOUNTER — Ambulatory Visit (HOSPITAL_COMMUNITY)
Admission: RE | Admit: 2011-08-20 | Discharge: 2011-08-20 | Disposition: A | Discharge status: Home / Self Care | Payer: Medicaid Other | Source: Ambulatory Visit | Attending: Surgery | Admitting: Surgery

## 2011-08-20 DIAGNOSIS — R1902 Left upper quadrant abdominal swelling, mass and lump: Secondary | ICD-10-CM

## 2011-08-20 DIAGNOSIS — R1900 Intra-abdominal and pelvic swelling, mass and lump, unspecified site: Secondary | ICD-10-CM

## 2011-08-20 MED ORDER — IOHEXOL 300 MG/ML  SOLN: 100.0000 mL | Freq: Once | INTRAMUSCULAR | Status: AC | PRN

## 2011-08-24 ENCOUNTER — Encounter (INDEPENDENT_AMBULATORY_CARE_PROVIDER_SITE_OTHER): Payer: Self-pay | Admitting: Surgery

## 2011-08-24 ENCOUNTER — Ambulatory Visit (INDEPENDENT_AMBULATORY_CARE_PROVIDER_SITE_OTHER): Payer: Medicaid Other | Admitting: Surgery

## 2011-08-24 VITALS — BP 154/102 | HR 76 | Temp 96.9°F | Resp 16 | Ht 70.0 in | Wt 237.4 lb

## 2011-08-24 DIAGNOSIS — K439 Ventral hernia without obstruction or gangrene: Secondary | ICD-10-CM

## 2011-08-24 NOTE — Progress Notes (Signed)
Subjective:     Patient ID: Charles Le, male   DOB: 11-Dec-1955, 55 y.o.   MRN: [id number]  HPI He is here for a followup visit to discuss findings on CAT scan. He again has minimal double discomfort. He has no obstructive symptoms  Review of Systems     Objective:   Physical Exam On exam, there is a small mass in the abdominal wall just to left of the midline several inches above the umbilicus. It is nontender and nonreducible   CAT scan of the abdomen and pelvis showed the mass to be a ventral hernia containing only fat. Assessment:     Ventral hernia    Plan:     Given his multiple comorbidities and anticoagulation, elective repair of this hernia is recommended for becomes emergent hernia. I discussed this with him in detail. I discussed the risks of surgery which includes but is not limited to bleeding, infection, recurrence, need for use of mesh, injury to surrounding structures, cardiopulmonary issues given his comorbidities. Preoperative cardiac clearance will be obtained. Surgery will then be scheduled. Likelihood of success with surgery is good

## 2011-08-26 ENCOUNTER — Encounter: Payer: Self-pay | Admitting: *Deleted

## 2011-08-27 ENCOUNTER — Ambulatory Visit (INDEPENDENT_AMBULATORY_CARE_PROVIDER_SITE_OTHER)
Admission: RE | Admit: 2011-08-27 | Discharge: 2011-08-27 | Disposition: A | Discharge status: Home / Self Care | Payer: Medicaid Other | Source: Ambulatory Visit | Attending: Internal Medicine | Admitting: Internal Medicine

## 2011-08-27 DIAGNOSIS — Z95810 Presence of automatic (implantable) cardiac defibrillator: Secondary | ICD-10-CM

## 2011-08-27 DIAGNOSIS — T82198A Other mechanical complication of other cardiac electronic device, initial encounter: Secondary | ICD-10-CM

## 2011-09-02 ENCOUNTER — Encounter: Payer: Self-pay | Admitting: *Deleted

## 2011-09-06 ENCOUNTER — Ambulatory Visit (INDEPENDENT_AMBULATORY_CARE_PROVIDER_SITE_OTHER): Payer: Medicaid Other | Admitting: *Deleted

## 2011-09-06 ENCOUNTER — Encounter: Payer: Self-pay | Admitting: Physician Assistant

## 2011-09-06 ENCOUNTER — Ambulatory Visit (INDEPENDENT_AMBULATORY_CARE_PROVIDER_SITE_OTHER): Payer: Medicaid Other | Admitting: Physician Assistant

## 2011-09-06 DIAGNOSIS — I4821 Permanent atrial fibrillation: Secondary | ICD-10-CM

## 2011-09-06 DIAGNOSIS — I4891 Unspecified atrial fibrillation: Secondary | ICD-10-CM

## 2011-09-06 DIAGNOSIS — I428 Other cardiomyopathies: Secondary | ICD-10-CM

## 2011-09-06 DIAGNOSIS — I421 Obstructive hypertrophic cardiomyopathy: Secondary | ICD-10-CM

## 2011-09-06 DIAGNOSIS — Z01810 Encounter for preprocedural cardiovascular examination: Secondary | ICD-10-CM

## 2011-09-06 LAB — ICD DEVICE OBSERVATION
ATRIAL PACING ICD: 0 pct
BATTERY VOLTAGE: 2.55 V
BRDY-0002RV: 60 {beats}/min
BRDY-0004RV: 115 {beats}/min
DEV-0020ICD: NEGATIVE
DEVICE MODEL ICD: 338865
MODE SWITCH EPISODES: 1
TOT-0006: 20120328000000
TOT-0007: 2

## 2011-09-06 NOTE — Assessment & Plan Note (Addendum)
Overall stable.  He needs surgical clearance for ventral hernia repair.  He can achieve 4 METs.  He has no unstable cardiac conditions.  It has been 2 years since his last echo.  Will arrange 2D echo to assess for stability of his HOCM.  As long there is no significant change, no further cardiovascular testing would be suggested and he can proceed with ventral hernia repair at acceptable risk.  Close attention should be paid to his volume status.  Our service will be available in the perioperative period as necessary.

## 2011-09-06 NOTE — Progress Notes (Signed)
History of Present Illness: Primary Electrophysiologist:  Dr. Sherryl Manges   Charles Le is a 55 y.o. male presents for surgical clearance.  He needs a ventral hernia repair with Dr. Rayburn Ma.  He has a h/o HOCM, AFib/AFlutter, s/p RFCA 3/09 for AFlutter, complete heart block, s/p dual chamber ICD for primary prevention.  Last echo 10/2009: severe asymmetric septal hypertrophy, hypertrophic septum appeared HK, EF 55-60%, grade 2 diast dysfxn, LVOT 14 mmHg at rest and 48 mmHg with Valsalva, septal thickness 22mm, post wall thickness 10 mm, chordal SAM but no SAM of MV leaflet, severe LAE, normal RVSF, PASP 30, trivial MR.  Last saw Dr. Graciela Husbands in 3/12 and he planned to see him back in 1 year.  Of note, patient has been intolerant to beta blockers in the past.    He denies chest pain.  He has had a recent h/o URI that has been slow to resolve.  Has seen his PCP at least once and was treated with cough med.  He notes some DOE.  Describes Class 2 symptoms.  These are fairly chronic without significant change.  He can achieve 4 METs without chest pain or limiting dyspnea.  No orthopnea, PND or significant edema.  No significant palps.  No syncope.  No ICD rx.  Denies a h/o stroke.  He has come off of coumadin in the past without Lovenox coverage.    Past Medical History  Diagnosis Date  . Hypertrophic obstructive cardiomyopathy     hypertrophic  . Aflutter     atypical-left sided  . DDD-ICD     [facility] Atlas DR [phone] ) dual chamber ICD implanted May 24, 2006)  . Atrioventricular block, complete     complete  . Hyperlipidemia   . Heart murmur   . Insomnia   . Anxiety   . Hernia     Current Outpatient Prescriptions  Medication Sig Dispense Refill  . ALPRAZolam (XANAX) 0.25 MG tablet Take 0.25 mg by mouth at bedtime as needed.        . ciclopirox (PENLAC) 8 % solution Apply topically at bedtime. Apply over nail and surrounding skin. Apply daily over previous coat. After seven (7) days,  may remove with alcohol and continue cycle.       . esomeprazole (NEXIUM) 40 MG capsule Take 1 capsule (40 mg total) by mouth daily before breakfast.  30 capsule  6  . lisinopril (PRINIVIL,ZESTRIL) 5 MG tablet Take 5 mg by mouth daily.        . pravastatin (PRAVACHOL) 20 MG tablet Take 20 mg by mouth daily.        . rivaroxaban (XARELTO) 10 MG TABS tablet Take 1 tablet (10 mg total) by mouth daily.  30 tablet  6  . traMADol (ULTRAM) 50 MG tablet Take 50 mg by mouth every 6 (six) hours as needed. Maximum dose= 8 tablets per day         Allergies: No Known Allergies  History  Substance Use Topics  . Smoking status: Current Everyday Smoker -- 0.5 packs/day for 18 years    Types: Cigarettes  . Smokeless tobacco: Never Used  . Alcohol Use: Yes     1 - 2 times per week     Family History  Problem Relation Age of Onset  . Hypertrophic cardiomyopathy Paternal Grandmother   . Diabetes Paternal Grandmother   . Heart attack Father   . Diabetes Father   . Hypertension Neg Hx   . Cancer  Mother     breast  . Cancer Maternal Grandfather     prostate     ROS:  Please see the history of present illness.  All other systems reviewed and negative.   Vital Signs: BP 140/78  Pulse 70  Ht 5\' 10"  (1.778 m)  Wt 240 lb (108.863 kg)  BMI 34.44 kg/m2  PHYSICAL EXAM: Well nourished, well developed, in no acute distress HEENT: normal Neck: no JVD Vascular: no carotid bruits Cardiac:  normal S1, S2; RRR; 1/6 systoic murmur along LSB Lungs:  Decreased breath sounds bilaterally, no wheezing, rhonchi or rales Abd: soft, nontender, no hepatomegaly Ext: no edema Skin: warm and dry Neuro:  CNs 2-12 intact, no focal abnormalities noted  EKG:  Underlying rhythm appears to be Afib, V paced, HR 70  ASSESSMENT AND PLAN:

## 2011-09-06 NOTE — Progress Notes (Signed)
ICD check 

## 2011-09-06 NOTE — Patient Instructions (Signed)
Your physician has requested that you have an echocardiogram DX 425.11 HOCM, 427.31 AFIB, V72.81 SURGERY CLEARANCE. Echocardiography is a painless test that uses sound waves to create images of your heart. It provides your doctor with information about the size and shape of your heart and how well your heart's chambers and valves are working. This procedure takes approximately one hour. There are no restrictions for this procedure.  NO CHANGES AT THIS TIME

## 2011-09-06 NOTE — Assessment & Plan Note (Signed)
CHADS2-VASc score is 1.  He denies a h/o stroke.  He is able to hold his Xarelto prior to surgery and resume post op when felt to be safe.

## 2011-09-08 ENCOUNTER — Encounter (INDEPENDENT_AMBULATORY_CARE_PROVIDER_SITE_OTHER): Payer: Self-pay

## 2011-09-08 ENCOUNTER — Encounter (INDEPENDENT_AMBULATORY_CARE_PROVIDER_SITE_OTHER): Payer: Self-pay | Admitting: Family Medicine

## 2011-09-14 ENCOUNTER — Other Ambulatory Visit (HOSPITAL_COMMUNITY): Payer: Medicaid Other | Admitting: Radiology

## 2011-09-14 ENCOUNTER — Ambulatory Visit (HOSPITAL_COMMUNITY): Payer: Medicaid Other | Attending: Cardiology | Admitting: Radiology

## 2011-09-14 DIAGNOSIS — I379 Nonrheumatic pulmonary valve disorder, unspecified: Secondary | ICD-10-CM | POA: Insufficient documentation

## 2011-09-14 DIAGNOSIS — M7989 Other specified soft tissue disorders: Secondary | ICD-10-CM | POA: Insufficient documentation

## 2011-09-14 DIAGNOSIS — I4891 Unspecified atrial fibrillation: Secondary | ICD-10-CM | POA: Insufficient documentation

## 2011-09-14 DIAGNOSIS — I4821 Permanent atrial fibrillation: Secondary | ICD-10-CM

## 2011-09-14 DIAGNOSIS — I059 Rheumatic mitral valve disease, unspecified: Secondary | ICD-10-CM | POA: Insufficient documentation

## 2011-09-14 DIAGNOSIS — I079 Rheumatic tricuspid valve disease, unspecified: Secondary | ICD-10-CM | POA: Insufficient documentation

## 2011-09-17 DIAGNOSIS — Z01810 Encounter for preprocedural cardiovascular examination: Secondary | ICD-10-CM | POA: Insufficient documentation

## 2011-09-17 NOTE — Assessment & Plan Note (Signed)
Follow up echo with stable findings.  He can proceed with surgery with acceptable risk.  Close attention needed to volume status during peri-op period.  It is acceptable to hold Xarelto 48 hours prior to surgery and resume post op when felt to be safe.  Tereso Newcomer, PA-C

## 2011-09-22 ENCOUNTER — Telehealth (INDEPENDENT_AMBULATORY_CARE_PROVIDER_SITE_OTHER): Payer: Self-pay | Admitting: Surgery

## 2011-09-27 ENCOUNTER — Telehealth: Payer: Self-pay | Admitting: Internal Medicine

## 2011-09-27 NOTE — Telephone Encounter (Signed)
FU Call: Pt calling regarding surgical clearance for hernia surgery. Pt general surgeon still needs to get surgical clearance to proceed with surgery and instructions for pt blood thinner use prior to and following surgery. Please return pt call to discuss further.

## 2011-09-27 NOTE — Telephone Encounter (Signed)
Informed the pt I would call [facility] and provide his surgical clearance note from [facility]. Pt provided office number for Dr. Abigail Miyamoto at [facility] [phone]. I spoke with French Ana and she advised to route last office note to Dr Magnus Ivan and Wilder Glade. Note routed and pt advised.

## 2011-10-02 ENCOUNTER — Other Ambulatory Visit (INDEPENDENT_AMBULATORY_CARE_PROVIDER_SITE_OTHER): Payer: Self-pay | Admitting: Surgery

## 2011-10-07 ENCOUNTER — Ambulatory Visit (INDEPENDENT_AMBULATORY_CARE_PROVIDER_SITE_OTHER): Payer: Medicaid Other | Admitting: *Deleted

## 2011-10-07 DIAGNOSIS — Z95810 Presence of automatic (implantable) cardiac defibrillator: Secondary | ICD-10-CM

## 2011-10-07 DIAGNOSIS — I442 Atrioventricular block, complete: Secondary | ICD-10-CM

## 2011-10-08 ENCOUNTER — Encounter (HOSPITAL_COMMUNITY): Admission: RE | Payer: Self-pay | Source: Ambulatory Visit

## 2011-10-08 ENCOUNTER — Encounter: Payer: Self-pay | Admitting: Internal Medicine

## 2011-10-08 ENCOUNTER — Ambulatory Visit (HOSPITAL_COMMUNITY): Admission: RE | Admit: 2011-10-08 | Payer: Medicaid Other | Source: Ambulatory Visit | Admitting: Surgery

## 2011-10-08 ENCOUNTER — Other Ambulatory Visit: Payer: Self-pay | Admitting: Internal Medicine

## 2011-10-08 LAB — REMOTE ICD DEVICE
BAMS-0001: 150 {beats}/min
BRDY-0002RA: 60 {beats}/min
BRDY-0003RA: 115 {beats}/min
BRDY-0004RA: 115 {beats}/min
DEV-0020ICD: NEGATIVE
DEVICE MODEL ICD: 338865

## 2011-10-08 SURGERY — REPAIR, HERNIA, VENTRAL
Anesthesia: General

## 2011-10-11 ENCOUNTER — Other Ambulatory Visit: Payer: Self-pay | Admitting: Internal Medicine

## 2011-10-12 ENCOUNTER — Telehealth (INDEPENDENT_AMBULATORY_CARE_PROVIDER_SITE_OTHER): Payer: Self-pay | Admitting: Surgery

## 2011-10-12 NOTE — Telephone Encounter (Signed)
Pt was called for a per op ov before Hernia sx pt has a appt on 11/11/11 @ 1:30pm and a reminder card was mailed out to pt

## 2011-10-14 MED ORDER — ESOMEPRAZOLE MAGNESIUM 40 MG PO CPDR: 40.0000 mg | DELAYED_RELEASE_CAPSULE | Freq: Every day | ORAL | Status: DC

## 2011-10-14 NOTE — Telephone Encounter (Addendum)
FU Call: Pt calling to check on status of nexium refill. Please call RX in ASAP, pt out

## 2011-10-15 ENCOUNTER — Telehealth: Payer: Self-pay | Admitting: Internal Medicine

## 2011-10-15 NOTE — Telephone Encounter (Signed)
Pt's pharmacy [facility] [address] requested two refills only called in one, still needs nexium, and only has two left, medicaid needs auth first then needs called into pharmacy, will be out by this time, can get samples?

## 2011-10-15 NOTE — Telephone Encounter (Signed)
Spoke with pt, according to medicaid the pt would need to have failed protonix or omeprazole before they will approve nexium. Pt states he has tried both of these meds with no relieve from his symptoms. nexium is the only thing that helps with his GERD. Approval given for use of nexium x one year. Pt and pharm aware

## 2011-10-18 ENCOUNTER — Other Ambulatory Visit: Payer: Self-pay

## 2011-10-18 MED ORDER — ESOMEPRAZOLE MAGNESIUM 40 MG PO CPDR: 40.0000 mg | DELAYED_RELEASE_CAPSULE | Freq: Every day | ORAL | Status: DC

## 2011-10-19 NOTE — Progress Notes (Signed)
Remote icd check  

## 2011-10-22 ENCOUNTER — Encounter: Payer: Self-pay | Admitting: Internal Medicine

## 2011-11-05 ENCOUNTER — Encounter: Payer: Self-pay | Admitting: *Deleted

## 2011-11-11 ENCOUNTER — Ambulatory Visit (INDEPENDENT_AMBULATORY_CARE_PROVIDER_SITE_OTHER): Payer: Medicaid Other | Admitting: Surgery

## 2011-11-11 ENCOUNTER — Encounter: Payer: Medicaid Other | Admitting: *Deleted

## 2011-11-11 ENCOUNTER — Encounter (INDEPENDENT_AMBULATORY_CARE_PROVIDER_SITE_OTHER): Payer: Self-pay | Admitting: Surgery

## 2011-11-11 VITALS — BP 143/88 | HR 74 | Temp 97.1°F | Resp 18 | Ht 70.0 in | Wt 238.0 lb

## 2011-11-11 DIAGNOSIS — K439 Ventral hernia without obstruction or gangrene: Secondary | ICD-10-CM

## 2011-11-11 DIAGNOSIS — Z01818 Encounter for other preprocedural examination: Secondary | ICD-10-CM

## 2011-11-11 NOTE — Progress Notes (Signed)
Patient ID: Charles Le, male   DOB: 05/20/1956, 55 y.o.   MRN: [id number]  Chief Complaint  Patient presents with  . Follow-up    ventral hernia    HPI Charles Le is a 55 y.o. male.   HPIHe is here for a preoperative visit regarding his ventral hernia. He is now lost pretty good weight and is doing quite well. He has been cleared from a cardiac standpoint. He has no other changes in his history  Past Medical History  Diagnosis Date  . Hypertrophic obstructive cardiomyopathy     hypertrophic  . Aflutter     atypical-left sided  . DDD-ICD     [employer] [address] [id number] ) dual chamber ICD implanted May 24, 2006)  . Atrioventricular block, complete     complete  . Hyperlipidemia   . Heart murmur   . Insomnia   . Anxiety   . Hernia     Past Surgical History  Procedure Date  . Hip surgery     replacement  . Icd implantation     ICD- St Jude    Family History  Problem Relation Age of Onset  . Hypertrophic cardiomyopathy Paternal Grandmother   . Diabetes Paternal Grandmother   . Heart attack Father   . Diabetes Father   . Hypertension Neg Hx   . Cancer Mother     breast  . Cancer Maternal Grandfather     prostate    Social History History  Substance Use Topics  . Smoking status: Current Everyday Smoker -- 0.5 packs/day for 18 years    Types: Cigarettes  . Smokeless tobacco: Never Used  . Alcohol Use: Yes     1 - 2 times per week    No Known Allergies  Current Outpatient Prescriptions  Medication Sig Dispense Refill  . ALPRAZolam (XANAX) 0.25 MG tablet Take 0.25 mg by mouth at bedtime as needed.        . ciclopirox (PENLAC) 8 % solution Apply topically at bedtime. Apply over nail and surrounding skin. Apply daily over previous coat. After seven (7) days, may remove with alcohol and continue cycle.       . esomeprazole (NEXIUM) 40 MG capsule Take 1 capsule (40 mg total) by mouth daily before breakfast.  30 capsule  5  . lisinopril (PRINIVIL,ZESTRIL)  5 MG tablet Take 5 mg by mouth daily.        . pravastatin (PRAVACHOL) 20 MG tablet Take 20 mg by mouth daily.        . traMADol (ULTRAM) 50 MG tablet Take 50 mg by mouth every 6 (six) hours as needed. Maximum dose= 8 tablets per day       . XARELTO 10 MG TABS tablet TAKE 1 TABLET BY MOUTH DAILY  30 tablet  5    Review of Systems Review of SystemsHis review of systems is otherwise unremarkable. He has no cardiopulmonary problems.  Blood pressure 143/88, pulse 74, temperature 97.1 F (36.2 C), temperature source Temporal, resp. rate 18, height 5' 10" (1.778 m), weight 238 lb (107.956 kg).  Physical Exam Physical Exam Lungs are clear to auscultation bilaterally. Cardiovascular regular rate and rhythm. His abdomen is soft. There is a partly reducible hernia just to the left of the midline above the umbilicus. It is nontender Data Reviewed   Assessment    Ventral hernia    Plan    Again, repair is recommended with mesh. I again discussed the surgery with him   in detail. I discussed the risk which includes but is not limited to bleeding, infection, injury to surrounding structures, need for use of mesh, cardiopulmonary issues, recurrence, etc. he understands and wishes to proceed. Likelihood of success is good       Danessa Mensch A 11/11/2011, 1:48 PM    

## 2011-11-15 ENCOUNTER — Encounter: Payer: Self-pay | Admitting: *Deleted

## 2011-11-15 ENCOUNTER — Encounter (HOSPITAL_COMMUNITY)
Admission: RE | Admit: 2011-11-15 | Discharge: 2011-11-15 | Disposition: A | Discharge status: Home / Self Care | Payer: Medicaid Other | Source: Ambulatory Visit | Attending: Surgery | Admitting: Surgery

## 2011-11-15 ENCOUNTER — Encounter (HOSPITAL_COMMUNITY): Payer: Self-pay

## 2011-11-15 LAB — BASIC METABOLIC PANEL
Calcium: 9.5 mg/dL (ref 8.4–10.5)
GFR calc Af Amer: >90 mL/min (ref >90)
GFR calc non Af Amer: 80 mL/min — ABNORMAL LOW (ref >90)
Glucose, Bld: 95 mg/dL (ref 70–99)
Potassium: 4.1 mEq/L (ref 3.5–5.1)
Sodium: 140 mEq/L (ref 135–145)

## 2011-11-15 LAB — CBC
MCH: 33.8 pg (ref 26.0–34.0)
Platelets: 205 10*3/uL (ref 150–400)
RBC: 4.77 MIL/uL (ref 4.22–5.81)

## 2011-11-15 LAB — SURGICAL PCR SCREEN
MRSA, PCR: NEGATIVE
Staphylococcus aureus: POSITIVE — AB

## 2011-11-15 NOTE — Pre-Procedure Instructions (Signed)
Charles Le  11/15/2011   Your procedure is scheduled on:  Nov 22, 2011 Monday   Report to [facility] at 0600 AM.  Call this number if you have problems the morning of surgery: [phone]   Remember:   Do not eat food:After Midnight.  May have clear liquids: up to 4 Hours before arrival.  Clear liquids include soda, tea, black coffee, apple or grape juice, broth.  Take these medicines the morning of surgery with A SIP OF WATER: nexium, pravastatin, ultram, stop xarelto   Do not wear jewelry, make-up or nail polish.  Do not wear lotions, powders, or perfumes. You may wear deodorant.  Do not shave 48 hours prior to surgery.  Do not bring valuables to the hospital.  Contacts, dentures or bridgework may not be worn into surgery.  Leave suitcase in the car. After surgery it may be brought to your room.  For patients admitted to the hospital, checkout time is 11:00 AM the day of discharge.   Patients discharged the day of surgery will not be allowed to drive home.  Name and phone number of your driver: Charles Le [phone]  Special Instructions: CHG Shower Use Special Wash: 1/2 bottle night before surgery and 1/2 bottle morning of surgery.   Please read over the following fact sheets that you were given: Pain Booklet and Surgical Site Infection Prevention

## 2011-11-15 NOTE — Pre-Procedure Instructions (Signed)
[address] ELMO RIO  11/15/2011   Your procedure is scheduled on:  Nov 22, 2011 Monday   Report to [facility] at 0600 AM.  Call this number if you have problems the morning of surgery: [phone]   Remember:   Do not eat food:After Midnight.  May have clear liquids: up to 4 Hours before arrival.  Clear liquids include soda, tea, black coffee, apple or grape juice, broth.  Take these medicines the morning of surgery with A SIP OF WATER: nexium, pravastatin, ultram, stop xarelto   Do not wear jewelry, make-up or nail polish.  Do not wear lotions, powders, or perfumes. You may wear deodorant.  Do not shave 48 hours prior to surgery.  Do not bring valuables to the hospital.  Contacts, dentures or bridgework may not be worn into surgery.  Leave suitcase in the car. After surgery it may be brought to your room.  For patients admitted to the hospital, checkout time is 11:00 AM the day of discharge.   Patients discharged the day of surgery will not be allowed to drive home.  Name and phone number of your driver: NA  Special Instructions: CHG Shower Use Special Wash: 1/2 bottle night before surgery and 1/2 bottle morning of surgery.   Please read over the following fact sheets that you were given: Pain Booklet and Surgical Site Infection Prevention

## 2011-11-18 ENCOUNTER — Other Ambulatory Visit: Payer: Self-pay | Admitting: Internal Medicine

## 2011-11-18 ENCOUNTER — Encounter: Payer: Self-pay | Admitting: Internal Medicine

## 2011-11-18 ENCOUNTER — Encounter: Payer: Medicaid Other | Admitting: *Deleted

## 2011-11-18 DIAGNOSIS — I428 Other cardiomyopathies: Secondary | ICD-10-CM

## 2011-11-18 LAB — REMOTE ICD DEVICE
AL AMPLITUDE: 2.4 mv
BATTERY VOLTAGE: 2.55 V
BRDY-0004RA: 115 {beats}/min
DEVICE MODEL ICD: 338865
RV LEAD IMPEDENCE ICD: 530 Ohm
VENTRICULAR PACING ICD: 0 pct

## 2011-11-18 NOTE — Progress Notes (Signed)
Spoke with Kerry Fort /w [employer], reported MD order relative to ICD, reported type of surgery, date & time.  He agrees to be present for procedure.

## 2011-11-21 MED ORDER — CEFAZOLIN SODIUM-DEXTROSE 2-3 GM-% IV SOLR
2.0000 g | INTRAVENOUS | Status: AC
  Administered 2011-11-22: 2 g via INTRAVENOUS
  Filled 2011-11-21: qty 50

## 2011-11-22 ENCOUNTER — Ambulatory Visit (HOSPITAL_COMMUNITY)
Admission: RE | Admit: 2011-11-22 | Discharge: 2011-11-22 | Disposition: A | Discharge status: Home / Self Care | Payer: Medicaid Other | Source: Ambulatory Visit | Attending: Surgery | Admitting: Surgery

## 2011-11-22 ENCOUNTER — Ambulatory Visit (HOSPITAL_COMMUNITY): Payer: Medicaid Other

## 2011-11-22 ENCOUNTER — Encounter (HOSPITAL_COMMUNITY)
Admission: RE | Disposition: A | Discharge status: Home / Self Care | Payer: Self-pay | Source: Ambulatory Visit | Attending: Surgery

## 2011-11-22 ENCOUNTER — Encounter (HOSPITAL_COMMUNITY): Payer: Self-pay

## 2011-11-22 DIAGNOSIS — I4891 Unspecified atrial fibrillation: Secondary | ICD-10-CM | POA: Insufficient documentation

## 2011-11-22 DIAGNOSIS — K439 Ventral hernia without obstruction or gangrene: Secondary | ICD-10-CM | POA: Insufficient documentation

## 2011-11-22 DIAGNOSIS — Z01812 Encounter for preprocedural laboratory examination: Secondary | ICD-10-CM | POA: Insufficient documentation

## 2011-11-22 HISTORY — PX: VENTRAL HERNIA REPAIR: SHX424

## 2011-11-22 SURGERY — REPAIR, HERNIA, VENTRAL
Anesthesia: General | Site: Abdomen | Wound class: Clean

## 2011-11-22 MED ORDER — SODIUM CHLORIDE 0.9 % IV SOLN: 250.0000 mL | INTRAVENOUS | Status: DC | PRN

## 2011-11-22 MED ORDER — LACTATED RINGERS IV SOLN: INTRAVENOUS | Status: DC | PRN

## 2011-11-22 MED ORDER — PROPOFOL 10 MG/ML IV EMUL
INTRAVENOUS | Status: DC | PRN
  Administered 2011-11-22: 300 mg via INTRAVENOUS

## 2011-11-22 MED ORDER — MIDAZOLAM HCL 5 MG/5ML IJ SOLN
INTRAMUSCULAR | Status: DC | PRN
  Administered 2011-11-22: 2 mg via INTRAVENOUS

## 2011-11-22 MED ORDER — OXYCODONE-ACETAMINOPHEN 5-325 MG PO TABS: ORAL_TABLET | ORAL | Status: AC

## 2011-11-22 MED ORDER — ONDANSETRON HCL 4 MG/2ML IJ SOLN: 4.0000 mg | Freq: Four times a day (QID) | INTRAMUSCULAR | Status: DC | PRN

## 2011-11-22 MED ORDER — PROMETHAZINE HCL 25 MG/ML IJ SOLN: 12.5000 mg | Freq: Four times a day (QID) | INTRAMUSCULAR | Status: DC | PRN

## 2011-11-22 MED ORDER — LACTATED RINGERS IV SOLN
INTRAVENOUS | Status: DC | PRN
  Administered 2011-11-22: 08:00:00 via INTRAVENOUS

## 2011-11-22 MED ORDER — ACETAMINOPHEN 325 MG PO TABS: 650.0000 mg | ORAL_TABLET | ORAL | Status: DC | PRN

## 2011-11-22 MED ORDER — OXYCODONE HCL 5 MG PO TABS: 5.0000 mg | ORAL_TABLET | ORAL | Status: DC | PRN

## 2011-11-22 MED ORDER — 0.9 % SODIUM CHLORIDE (POUR BTL) OPTIME
TOPICAL | Status: DC | PRN
  Administered 2011-11-22: 1000 mL

## 2011-11-22 MED ORDER — BUPIVACAINE LIPOSOME 1.3 % IJ SUSP
20.0000 mL | Freq: Once | INTRAMUSCULAR | Status: AC
  Administered 2011-11-22: 20 mL
  Filled 2011-11-22: qty 20

## 2011-11-22 MED ORDER — HYDROMORPHONE HCL PF 1 MG/ML IJ SOLN: 0.2500 mg | INTRAMUSCULAR | Status: DC | PRN

## 2011-11-22 MED ORDER — SODIUM CHLORIDE 0.9 % IJ SOLN: 3.0000 mL | Freq: Two times a day (BID) | INTRAMUSCULAR | Status: DC

## 2011-11-22 MED ORDER — SCOPOLAMINE 1 MG/3DAYS TD PT72
MEDICATED_PATCH | TRANSDERMAL | Status: DC | PRN
  Administered 2011-11-22: 1.5 mg via TRANSDERMAL

## 2011-11-22 MED ORDER — FENTANYL CITRATE 0.05 MG/ML IJ SOLN
INTRAMUSCULAR | Status: DC | PRN
  Administered 2011-11-22 (×2): 50 ug via INTRAVENOUS

## 2011-11-22 MED ORDER — SODIUM CHLORIDE 0.9 % IJ SOLN: 3.0000 mL | INTRAMUSCULAR | Status: DC | PRN

## 2011-11-22 MED ORDER — ACETAMINOPHEN 650 MG RE SUPP: 650.0000 mg | RECTAL | Status: DC | PRN

## 2011-11-22 SURGICAL SUPPLY — 45 items
APL SKNCLS STERI-STRIP NONHPOA (GAUZE/BANDAGES/DRESSINGS) ×1
BENZOIN TINCTURE PRP APPL 2/3 (GAUZE/BANDAGES/DRESSINGS) ×1 IMPLANT
BLADE SURG ROTATE 9660 (MISCELLANEOUS) ×1 IMPLANT
CANISTER SUCTION 2500CC (MISCELLANEOUS) ×2 IMPLANT
CHLORAPREP W/TINT 26ML (MISCELLANEOUS) ×2 IMPLANT
CLOTH BEACON ORANGE TIMEOUT ST (SAFETY) ×2 IMPLANT
COVER SURGICAL LIGHT HANDLE (MISCELLANEOUS) ×2 IMPLANT
DRAPE LAPAROSCOPIC ABDOMINAL (DRAPES) ×2 IMPLANT
DRESSING TELFA 8X3 (GAUZE/BANDAGES/DRESSINGS) ×1 IMPLANT
DRSG TEGADERM 4X4.75 (GAUZE/BANDAGES/DRESSINGS) ×1 IMPLANT
ELECT CAUTERY BLADE 6.4 (BLADE) ×2 IMPLANT
ELECT REM PT RETURN 9FT ADLT (ELECTROSURGICAL) ×2
ELECTRODE REM PT RTRN 9FT ADLT (ELECTROSURGICAL) ×1 IMPLANT
GLOVE BIO SURGEON STRL SZ7 (GLOVE) ×1 IMPLANT
GLOVE BIOGEL PI IND STRL 6 (GLOVE) IMPLANT
GLOVE BIOGEL PI IND STRL 6.5 (GLOVE) IMPLANT
GLOVE BIOGEL PI INDICATOR 6 (GLOVE) ×1
GLOVE BIOGEL PI INDICATOR 6.5 (GLOVE) ×1
GLOVE ECLIPSE 6.5 STRL STRAW (GLOVE) ×1 IMPLANT
GLOVE SURG SIGNA 7.5 PF LTX (GLOVE) ×2 IMPLANT
GOWN PREVENTION PLUS XLARGE (GOWN DISPOSABLE) ×2 IMPLANT
GOWN STRL NON-REIN LRG LVL3 (GOWN DISPOSABLE) ×5 IMPLANT
KIT BASIN OR (CUSTOM PROCEDURE TRAY) ×2 IMPLANT
KIT ROOM TURNOVER OR (KITS) ×2 IMPLANT
NDL HYPO 25GX1X1/2 BEV (NEEDLE) ×1 IMPLANT
NEEDLE HYPO 25GX1X1/2 BEV (NEEDLE) ×2 IMPLANT
NS IRRIG 1000ML POUR BTL (IV SOLUTION) ×2 IMPLANT
PACK GENERAL/GYN (CUSTOM PROCEDURE TRAY) ×2 IMPLANT
PAD ARMBOARD 7.5X6 YLW CONV (MISCELLANEOUS) ×4 IMPLANT
PATCH VENTRAL SMALL 4.3 (Mesh Specialty) ×1 IMPLANT
SPONGE GAUZE 4X4 12PLY (GAUZE/BANDAGES/DRESSINGS) ×2 IMPLANT
STAPLER VISISTAT 35W (STAPLE) IMPLANT
STRIP CLOSURE SKIN 1/2X4 (GAUZE/BANDAGES/DRESSINGS) ×1 IMPLANT
SUT MNCRL AB 4-0 PS2 18 (SUTURE) ×1 IMPLANT
SUT NOVA NAB DX-16 0-1 5-0 T12 (SUTURE) ×1 IMPLANT
SUT PROLENE 1 CT (SUTURE) IMPLANT
SUT SILK 2 0 (SUTURE)
SUT SILK 2-0 18XBRD TIE 12 (SUTURE) IMPLANT
SUT VIC AB 3-0 SH 27 (SUTURE) ×2
SUT VIC AB 3-0 SH 27X BRD (SUTURE) IMPLANT
SYR CONTROL 10ML LL (SYRINGE) ×2 IMPLANT
TOWEL OR 17X24 6PK STRL BLUE (TOWEL DISPOSABLE) ×2 IMPLANT
TOWEL OR 17X26 10 PK STRL BLUE (TOWEL DISPOSABLE) ×2 IMPLANT
TRAY FOLEY CATH 14FRSI W/METER (CATHETERS) IMPLANT
WATER STERILE IRR 1000ML POUR (IV SOLUTION) IMPLANT

## 2011-11-22 NOTE — Transfer of Care (Signed)
Immediate Anesthesia Transfer of Care Note  Patient: Charles Le  Procedure(s) Performed:  HERNIA REPAIR VENTRAL ADULT - Ventral hernia repair with mesh  Patient Location: PACU  Anesthesia Type: General  Level of Consciousness: awake, alert  and oriented  Airway & Oxygen Therapy: Patient Spontanous Breathing and Patient connected to nasal cannula oxygen  Post-op Assessment: Report given to PACU RN, Post -op Vital signs reviewed and stable and Patient moving all extremities  Post vital signs: Reviewed and stable  Complications: No apparent anesthesia complications

## 2011-11-22 NOTE — Progress Notes (Signed)
Spoke with Arlys John from [facility] Re pts defibrilator.  He will come and see pt preop

## 2011-11-22 NOTE — Op Note (Signed)
11/22/2011  9:39 AM  PATIENT:  Charles Le  56 y.o. male  PRE-OPERATIVE DIAGNOSIS:  Ventral hernia  POST-OPERATIVE DIAGNOSIS:  Ventral hernia  PROCEDURE:  Procedure(s): HERNIA REPAIR VENTRAL ADULT with mesh (4 cm round v-patch)  SURGEON:  Surgeon(s): Shelly Rubenstein, MD  PHYSICIAN ASSISTANT:   ASSISTANTS: none   ANESTHESIA:   local and general  EBL:     BLOOD ADMINISTERED:none  DRAINS: none   LOCAL MEDICATIONS USED:  BUPIVICAINE 20CC  SPECIMEN:  No Specimen  DISPOSITION OF SPECIMEN:  N/A  COUNTS:  YES  TOURNIQUET:  * No tourniquets in log *  DICTATION: .Dragon Dictation Indications: This is a 56 year old gentleman with a ventral hernia. The decision has been made to proceed with repair  Procedure: The patient was brought to the operating room identified as the correct patient. He is placed on the operating table and general anesthesia was induced. His abdomen was then prepped and draped in the usual sterile fashion. The hernia defect was just to the left of midline above the umbilicus. I anesthetized the skin over the palpable defect with bupivacaine. I then made an incision with a scalpel. I took this down to the hernia which is found to contain only preperitoneal fat. I excised the fat was completely. The fascial defect was approximately 1/2 cm in size. Probably for some air around the patch onto the field. I placed the mesh through the fascial opening and then pulled it up tight to the peritoneum. I then sewed it in place with #1 Novafil sutures. Of the mucosa fascia over the top of this with #1 Novafil sutures as well. Good closure of the hernia defect appeared to be achieved. I then anesthetized the surrounding fascia further with bupivacaine. The subcutaneous tissue was then closed with interrupted 3-0 Vicryl sutures and the skin was closed with a running 4-0 Monocryl. Steri-Strips, Telfa, and Tegaderm were then applied. The patient tolerated the procedure well.  All the counts were correct at the end of the procedure. The patient was then extubated in the operating room and taken in a stable condition to the recovery room. PLAN OF CARE: Discharge to home after PACU  PATIENT DISPOSITION:  PACU - hemodynamically stable.   Delay start of Pharmacological VTE agent (>24hrs) due to surgical blood loss or risk of bleeding:  {YES/NO/NOT APPLICABLE:20182

## 2011-11-22 NOTE — Interval H&P Note (Signed)
History and Physical Interval Note: he has had no change in his history or exam  11/22/2011 7:08 AM  Charles Le  has presented today for surgery, with the diagnosis of Ventral hernia  The various methods of treatment have been discussed with the patient and family. After consideration of risks, benefits and other options for treatment, the patient has consented to  Procedure(s): HERNIA REPAIR VENTRAL ADULT as a surgical intervention .  The patients' history has been reviewed, patient examined, no change in status, stable for surgery.  I have reviewed the patients' chart and labs.  Questions were answered to the patient's satisfaction.     [name] A

## 2011-11-22 NOTE — OR Nursing (Signed)
Charting from [phone] to [phone] by S. Hitchcock, RN under A. Selinda Eon, RN login

## 2011-11-22 NOTE — Anesthesia Preprocedure Evaluation (Addendum)
Anesthesia Evaluation  Patient identified by MRN, date of birth, ID band Patient awake    Reviewed: Allergy & Precautions  History of Anesthesia Complications (+) PONV  Airway Mallampati: I TM Distance: >3 FB Neck ROM: Full    Dental  (+) Poor Dentition and Chipped   Pulmonary          Cardiovascular + dysrhythmias Atrial Fibrillation Irregular Normal Chronic aflutter on Xarelto. St. Jude AICD, DDD @ 70, Defib off 11-22-11 @ [id number].   Neuro/Psych Anxiety    GI/Hepatic   Endo/Other    Renal/GU      Musculoskeletal   Abdominal   Peds  Hematology   Anesthesia Other Findings   Reproductive/Obstetrics                          Anesthesia Physical Anesthesia Plan  ASA: III  Anesthesia Plan: General   Post-op Pain Management:    Induction: Intravenous  Airway Management Planned: Oral ETT and LMA  Additional Equipment:   Intra-op Plan:   Post-operative Plan: Extubation in OR  Informed Consent: I have reviewed the patients History and Physical, chart, labs and discussed the procedure including the risks, benefits and alternatives for the proposed anesthesia with the patient or authorized representative who has indicated his/her understanding and acceptance.   Dental advisory given  Plan Discussed with: CRNA and Anesthesiologist  Anesthesia Plan Comments:        Anesthesia Quick Evaluation

## 2011-11-22 NOTE — Progress Notes (Signed)
aicd turned back on by st judes rep

## 2011-11-22 NOTE — H&P (View-Only) (Signed)
Patient ID: Charles Le, male   DOB: 11-01-1956, 56 y.o.   MRN: [id number]  Chief Complaint  Patient presents with  . Follow-up    ventral hernia    HPI Charles Le is a 56 y.o. male.   HPIHe is here for a preoperative visit regarding his ventral hernia. He is now lost pretty good weight and is doing quite well. He has been cleared from a cardiac standpoint. He has no other changes in his history  Past Medical History  Diagnosis Date  . Hypertrophic obstructive cardiomyopathy     hypertrophic  . Aflutter     atypical-left sided  . DDD-ICD     [facility] [address] [phone] ) dual chamber ICD implanted May 24, 2006)  . Atrioventricular block, complete     complete  . Hyperlipidemia   . Heart murmur   . Insomnia   . Anxiety   . Hernia     Past Surgical History  Procedure Date  . Hip surgery     replacement  . Icd implantation     ICD- St Jude    Family History  Problem Relation Age of Onset  . Hypertrophic cardiomyopathy Paternal Grandmother   . Diabetes Paternal Grandmother   . Heart attack Father   . Diabetes Father   . Hypertension Neg Hx   . Cancer Mother     breast  . Cancer Maternal Grandfather     prostate    Social History History  Substance Use Topics  . Smoking status: Current Everyday Smoker -- 0.5 packs/day for 18 years    Types: Cigarettes  . Smokeless tobacco: Never Used  . Alcohol Use: Yes     1 - 2 times per week    No Known Allergies  Current Outpatient Prescriptions  Medication Sig Dispense Refill  . ALPRAZolam (XANAX) 0.25 MG tablet Take 0.25 mg by mouth at bedtime as needed.        . ciclopirox (PENLAC) 8 % solution Apply topically at bedtime. Apply over nail and surrounding skin. Apply daily over previous coat. After seven (7) days, may remove with alcohol and continue cycle.       . esomeprazole (NEXIUM) 40 MG capsule Take 1 capsule (40 mg total) by mouth daily before breakfast.  30 capsule  5  . lisinopril (PRINIVIL,ZESTRIL)  5 MG tablet Take 5 mg by mouth daily.        . pravastatin (PRAVACHOL) 20 MG tablet Take 20 mg by mouth daily.        . traMADol (ULTRAM) 50 MG tablet Take 50 mg by mouth every 6 (six) hours as needed. Maximum dose= 8 tablets per day       . XARELTO 10 MG TABS tablet TAKE 1 TABLET BY MOUTH DAILY  30 tablet  5    Review of Systems Review of SystemsHis review of systems is otherwise unremarkable. He has no cardiopulmonary problems.  Blood pressure 143/88, pulse 74, temperature 97.1 F (36.2 C), temperature source Temporal, resp. rate 18, height 5\' 10"  (1.778 m), weight 238 lb (107.956 kg).  Physical Exam Physical Exam Lungs are clear to auscultation bilaterally. Cardiovascular regular rate and rhythm. His abdomen is soft. There is a partly reducible hernia just to the left of the midline above the umbilicus. It is nontender Data Reviewed   Assessment    Ventral hernia    Plan    Again, repair is recommended with mesh. I again discussed the surgery with him  in detail. I discussed the risk which includes but is not limited to bleeding, infection, injury to surrounding structures, need for use of mesh, cardiopulmonary issues, recurrence, etc. he understands and wishes to proceed. Likelihood of success is good       Imran Nuon A 11/11/2011, 1:48 PM

## 2011-11-22 NOTE — Anesthesia Postprocedure Evaluation (Signed)
Anesthesia Post Note  Patient: Charles Le  Procedure(s) Performed:  HERNIA REPAIR VENTRAL ADULT - Ventral hernia repair with mesh  Anesthesia type: General  Patient location: PACU  Post pain: Pain level controlled and Adequate analgesia  Post assessment: Post-op Vital signs reviewed, Patient's Cardiovascular Status Stable, Respiratory Function Stable, Patent Airway and Pain level controlled  Last Vitals:  Filed Vitals:   11/22/11 0945  BP: 150/95  Pulse: 70  Temp: 36.3 C  Resp: 32    Post vital signs: Reviewed and stable  Level of consciousness: awake, alert  and oriented  Complications: No apparent anesthesia complications

## 2011-11-23 ENCOUNTER — Encounter: Payer: Self-pay | Admitting: *Deleted

## 2011-11-23 ENCOUNTER — Encounter (HOSPITAL_COMMUNITY): Payer: Self-pay | Admitting: Surgery

## 2011-12-13 ENCOUNTER — Telehealth: Payer: Self-pay | Admitting: Internal Medicine

## 2011-12-13 NOTE — Telephone Encounter (Signed)
I spoke with Misty Stanley. They are very frustrated at having to wait for the battery checks every month to see how much time the patient has on his battery. She states they cancelled a trip this week because the patient is nervous about going out of town. They want to know why he can't just go ahead and have his battery replaced. I explained to Misty Stanley that I don't think insurance will pay for the generator change until the battery reaches a certain voltage. I will review with the device clinic tomorrow and call her back.

## 2011-12-13 NOTE — Telephone Encounter (Signed)
New Problem   Patient fiance Misty Stanley calling for patient, who is having panic attacks associated with his device battery approaching End of Life status.  He has travel plans and is scared to make the trip as a result.  Please return call to patient finance Misty Stanley who would like to ask several questions about battery replacement, she can be reach at [facility] cell [phone]

## 2011-12-14 NOTE — Telephone Encounter (Signed)
I spoke with Charles Le in device clinic. She states the patient's battery voltage is at 2.55 and he has been there since October 2011. He will be at [facility] at 2.45 V. He is due for his battery check next week. Per Charles Le, she will call the patient/ his finance and answer there questions.

## 2011-12-14 NOTE — Telephone Encounter (Signed)
Spoke with Misty Stanley and answered all questions. Ensured to fiance that pt is getting monthly checks to check battery life and we are keeping close eye on battery. Pt to send transmission on 12-30-11 and then to see Graciela Husbands on 01-27-12. Fiance understands and will ensure pt to send transmissions.

## 2011-12-20 ENCOUNTER — Encounter (INDEPENDENT_AMBULATORY_CARE_PROVIDER_SITE_OTHER): Payer: Self-pay | Admitting: Surgery

## 2011-12-20 ENCOUNTER — Ambulatory Visit (INDEPENDENT_AMBULATORY_CARE_PROVIDER_SITE_OTHER): Payer: Medicaid Other | Admitting: Surgery

## 2011-12-20 VITALS — BP 144/98 | HR 64 | Temp 97.6°F | Resp 16 | Ht 70.0 in | Wt 235.6 lb

## 2011-12-20 DIAGNOSIS — Z09 Encounter for follow-up examination after completed treatment for conditions other than malignant neoplasm: Secondary | ICD-10-CM

## 2011-12-20 DIAGNOSIS — S6990XA Unspecified injury of unspecified wrist, hand and finger(s), initial encounter: Secondary | ICD-10-CM

## 2011-12-20 NOTE — Progress Notes (Signed)
Subjective:     Patient ID: Charles Le, male   DOB: 06-17-1956, 56 y.o.   MRN: [id number]  HPI  He is here for his first postoperative visit status post ventral hernia repair with mesh. He is doing well from an abdominal standpoint. He has minimal postoperative discomfort. He does report a me that he fell in December and hit his hand on a hard iron. This was the left hand. He reports pain in the palm and difficulty moving his fifth digit Review of Systems     Objective:   Physical Exam On exam, his abdominal incision is healing well. There is no evidence of recurrent hernia. I examined his left hand and there is a palpable cord on the palm. He is able to flex and extend the fifth digit.    Assessment:     Patient status post ventral hernia repair with mesh. Trauma to left hand    Plan:     Regarding his hernia surgery, he may not return to the gym and normal activity. I will see him back as needed. I will for him to see a hand surgeon regarding the hand trauma

## 2011-12-23 ENCOUNTER — Encounter: Payer: Medicaid Other | Admitting: *Deleted

## 2011-12-27 ENCOUNTER — Encounter: Payer: Self-pay | Admitting: *Deleted

## 2012-01-27 ENCOUNTER — Encounter: Payer: Medicaid Other | Admitting: Internal Medicine

## 2012-02-10 ENCOUNTER — Encounter: Payer: Self-pay | Admitting: Internal Medicine

## 2012-02-10 ENCOUNTER — Ambulatory Visit (INDEPENDENT_AMBULATORY_CARE_PROVIDER_SITE_OTHER): Payer: Medicaid Other | Admitting: Internal Medicine

## 2012-02-10 VITALS — BP 153/100 | HR 70 | Ht 70.0 in | Wt 256.8 lb

## 2012-02-10 DIAGNOSIS — Z95810 Presence of automatic (implantable) cardiac defibrillator: Secondary | ICD-10-CM

## 2012-02-10 DIAGNOSIS — I10 Essential (primary) hypertension: Secondary | ICD-10-CM | POA: Insufficient documentation

## 2012-02-10 DIAGNOSIS — I442 Atrioventricular block, complete: Secondary | ICD-10-CM

## 2012-02-10 DIAGNOSIS — I4821 Permanent atrial fibrillation: Secondary | ICD-10-CM

## 2012-02-10 DIAGNOSIS — T82198A Other mechanical complication of other cardiac electronic device, initial encounter: Secondary | ICD-10-CM

## 2012-02-10 DIAGNOSIS — I4891 Unspecified atrial fibrillation: Secondary | ICD-10-CM

## 2012-02-10 DIAGNOSIS — I421 Obstructive hypertrophic cardiomyopathy: Secondary | ICD-10-CM

## 2012-02-10 LAB — ICD DEVICE OBSERVATION
AL AMPLITUDE: 3.1 mv
ATRIAL PACING ICD: 0 pct
BAMS-0001: 170 {beats}/min
BAMS-0003: 70 {beats}/min
BATTERY VOLTAGE: 2.55 V
BRDY-0003RV: 120 {beats}/min
BRDY-0004RV: 130 {beats}/min
DEVICE MODEL ICD: 338865
HV IMPEDENCE: 40 Ohm
MODE SWITCH EPISODES: 1
RV LEAD IMPEDENCE ICD: 495 Ohm
RV LEAD THRESHOLD: 1 V
VENTRICULAR PACING ICD: 0 pct

## 2012-02-10 MED ORDER — LISINOPRIL 10 MG PO TABS: 10.0000 mg | ORAL_TABLET | Freq: Every day | ORAL | Status: DC

## 2012-02-10 NOTE — Patient Instructions (Signed)
Your physician has recommended you make the following change in your medication:  1) Increase lisinopril to 10 mg one tablet by mouth daily.  Remote monitoring is used to monitor your Pacemaker of ICD from home. This monitoring reduces the number of office visits required to check your device to one time per year. It allows [place] to keep an eye on the functioning of your device to ensure it is working properly. You are scheduled for a device check from home on 03/09/12. You may send your transmission at any time that day. If you have a wireless device, the transmission will be sent automatically. After your physician reviews your transmission, you will receive a postcard with your next transmission date.  Your physician wants you to follow-up in: 1 year with Dr. Graciela Husbands. You will receive a reminder letter in the mail two months in advance. If you don't receive a letter, please call our office to schedule the follow-up appointment.  Please follow up with Dr. Marina Goodell in about 6-8 weeks to have your blood pressure checked.

## 2012-02-10 NOTE — Assessment & Plan Note (Signed)
Her blood pressure is elevated. We'll increase his lisinopril from 5-10. I've asked that he followup with his PCP in the next 6-8 weeks

## 2012-02-10 NOTE — Assessment & Plan Note (Signed)
stable °

## 2012-02-10 NOTE — Assessment & Plan Note (Signed)
The patient's device was interrogated.  The information was reviewed. No changes were made in the programming.    

## 2012-02-10 NOTE — Progress Notes (Signed)
  HPI  Charles Le is a 56 y.o. male With hypertrophic cardiomyopathy complicated by permanent atrial arrhythmias. He also has complete heart block and is status post ICD implantation for primary prevention.  This is having come off the atenolol in January he feels terrific. His blood pressures at home are in the 120/80 range  Denies chest ain or sob  Past Medical History  Diagnosis Date  . Hypertrophic obstructive cardiomyopathy     hypertrophic  . Aflutter     atypical-left sided  . DDD-ICD     [facility] Atlas DR [phone] ) dual chamber ICD implanted May 24, 2006)  . Atrioventricular block, complete     complete  . Hyperlipidemia   . Heart murmur   . Insomnia   . Anxiety   . Hernia     Past Surgical History  Procedure Date  . Hip surgery     replacement  . Icd implantation     ICD- St Jude  . Cardioversion 2006  . Ventral hernia repair 11/22/2011    Procedure: HERNIA REPAIR VENTRAL ADULT;  Surgeon: Shelly Rubenstein, MD;  Location: [facility] OR;  Service: General;  Laterality: N/A;  Ventral hernia repair with mesh    Current Outpatient Prescriptions  Medication Sig Dispense Refill  . ALPRAZolam (XANAX) 0.25 MG tablet Take 0.25 mg by mouth at bedtime as needed.        . ciclopirox (PENLAC) 8 % solution Apply topically at bedtime. Apply over nail and surrounding skin. Apply daily over previous coat. After seven (7) days, may remove with alcohol and continue cycle.       . esomeprazole (NEXIUM) 40 MG capsule Take 1 capsule (40 mg total) by mouth daily before breakfast.  30 capsule  5  . lisinopril (PRINIVIL,ZESTRIL) 5 MG tablet Take 5 mg by mouth daily.        . pravastatin (PRAVACHOL) 20 MG tablet Take 20 mg by mouth daily.        . traZODone (DESYREL) 50 MG tablet Take 50 mg by mouth at bedtime.      [name] 10 MG TABS tablet TAKE 1 TABLET BY MOUTH DAILY  30 tablet  5    No Known Allergies  Review of Systems negative except from HPI and PMH  Physical Exam BP  153/100  Pulse 70  Ht 5\' 10"  (1.778 m)  Wt 256 lb 12.8 oz (116.484 kg)  BMI 36.85 kg/m2 Well developed and well nourished in no acute distress HENT normal E scleral and icterus clear Neck Supple JVP flat; carotids brisk and full Clear to ausculation Regular rate and rhythm, no murmurs gallops or rub Soft with active bowel sounds No clubbing cyanosis none Edema Alert and oriented, grossly normal motor and sensory function Skin Warm and Dry  ECG  AFIB  CHB with V pacing  Assessment and  Plan

## 2012-02-10 NOTE — Assessment & Plan Note (Addendum)
Permanent  On Rivaroxaban

## 2012-03-09 ENCOUNTER — Encounter: Payer: Medicaid Other | Admitting: *Deleted

## 2012-03-17 ENCOUNTER — Encounter: Payer: Self-pay | Admitting: *Deleted

## 2012-04-03 ENCOUNTER — Encounter: Payer: Self-pay | Admitting: Internal Medicine

## 2012-04-03 ENCOUNTER — Ambulatory Visit (INDEPENDENT_AMBULATORY_CARE_PROVIDER_SITE_OTHER): Payer: Medicaid Other | Admitting: *Deleted

## 2012-04-03 DIAGNOSIS — Z95810 Presence of automatic (implantable) cardiac defibrillator: Secondary | ICD-10-CM

## 2012-04-03 DIAGNOSIS — I421 Obstructive hypertrophic cardiomyopathy: Secondary | ICD-10-CM

## 2012-04-04 LAB — REMOTE ICD DEVICE
AL IMPEDENCE ICD: 395 Ohm
BAMS-0001: 170 {beats}/min
BAMS-0003: 70 {beats}/min
BATTERY VOLTAGE: 2.55 V
DEV-0020ICD: NEGATIVE

## 2012-04-11 ENCOUNTER — Other Ambulatory Visit: Payer: Self-pay | Admitting: Internal Medicine

## 2012-04-25 ENCOUNTER — Encounter: Payer: Self-pay | Admitting: *Deleted

## 2012-05-05 ENCOUNTER — Encounter: Payer: Medicaid Other | Admitting: *Deleted

## 2012-05-12 ENCOUNTER — Encounter: Payer: Self-pay | Admitting: *Deleted

## 2012-05-26 ENCOUNTER — Ambulatory Visit (INDEPENDENT_AMBULATORY_CARE_PROVIDER_SITE_OTHER): Payer: Medicaid Other | Admitting: *Deleted

## 2012-05-26 ENCOUNTER — Encounter: Payer: Self-pay | Admitting: Internal Medicine

## 2012-05-26 DIAGNOSIS — Z95810 Presence of automatic (implantable) cardiac defibrillator: Secondary | ICD-10-CM

## 2012-05-30 LAB — REMOTE ICD DEVICE
AL AMPLITUDE: 2.8 mv
AL IMPEDENCE ICD: 400 Ohm
ATRIAL PACING ICD: 0 pct
BAMS-0001: 170 {beats}/min
BAMS-0003: 70 {beats}/min
BATTERY VOLTAGE: 2.55 v
BRDY-0002RA: 70 {beats}/min
BRDY-0003RA: 120 {beats}/min
BRDY-0004RA: 130 {beats}/min
DEV-0020ICD: NEGATIVE
DEVICE MODEL ICD: 338865
RV LEAD IMPEDENCE ICD: 530 Ohm
VENTRICULAR PACING ICD: 0 pct

## 2012-06-29 ENCOUNTER — Encounter: Payer: Self-pay | Admitting: *Deleted

## 2012-07-31 ENCOUNTER — Encounter: Payer: Self-pay | Admitting: *Deleted

## 2012-07-31 ENCOUNTER — Encounter: Payer: Medicaid Other | Admitting: *Deleted

## 2012-09-06 ENCOUNTER — Ambulatory Visit (INDEPENDENT_AMBULATORY_CARE_PROVIDER_SITE_OTHER): Payer: Medicaid Other | Admitting: *Deleted

## 2012-09-06 ENCOUNTER — Encounter: Payer: Self-pay | Admitting: Internal Medicine

## 2012-09-06 DIAGNOSIS — Z95810 Presence of automatic (implantable) cardiac defibrillator: Secondary | ICD-10-CM

## 2012-09-06 DIAGNOSIS — I421 Obstructive hypertrophic cardiomyopathy: Secondary | ICD-10-CM

## 2012-09-06 DIAGNOSIS — R0989 Other specified symptoms and signs involving the circulatory and respiratory systems: Secondary | ICD-10-CM

## 2012-09-07 LAB — REMOTE ICD DEVICE
AL AMPLITUDE: 3 mv
ATRIAL PACING ICD: 0 pct
BAMS-0001: 170 {beats}/min
BAMS-0003: 70 {beats}/min
BRDY-0003RA: 120 {beats}/min
BRDY-0004RA: 130 {beats}/min
DEV-0020ICD: NEGATIVE
DEVICE MODEL ICD: 338865
RV LEAD IMPEDENCE ICD: 500 Ohm

## 2012-10-03 ENCOUNTER — Encounter: Payer: Self-pay | Admitting: *Deleted

## 2012-10-04 ENCOUNTER — Other Ambulatory Visit: Payer: Self-pay

## 2012-10-04 MED ORDER — ESOMEPRAZOLE MAGNESIUM 40 MG PO CPDR: 40.0000 mg | DELAYED_RELEASE_CAPSULE | Freq: Every day | ORAL | Status: DC

## 2012-10-05 ENCOUNTER — Other Ambulatory Visit: Payer: Self-pay

## 2012-10-05 MED ORDER — ESOMEPRAZOLE MAGNESIUM 40 MG PO CPDR: 40.0000 mg | DELAYED_RELEASE_CAPSULE | Freq: Every day | ORAL | Status: DC

## 2012-10-09 ENCOUNTER — Encounter: Payer: Medicaid Other | Admitting: *Deleted

## 2012-10-10 ENCOUNTER — Encounter: Payer: Self-pay | Admitting: *Deleted

## 2012-10-16 ENCOUNTER — Telehealth: Payer: Self-pay | Admitting: Internal Medicine

## 2012-10-16 MED ORDER — RIVAROXABAN 10 MG PO TABS: 10.0000 mg | ORAL_TABLET | Freq: Every day | ORAL | Status: DC

## 2012-10-16 NOTE — Telephone Encounter (Signed)
New problem:  Xarelto.  10 mg.   [facility] [address] / [employer]

## 2012-12-01 ENCOUNTER — Encounter: Payer: Self-pay | Admitting: *Deleted

## 2013-01-31 ENCOUNTER — Encounter: Payer: Self-pay | Admitting: Internal Medicine

## 2013-01-31 ENCOUNTER — Ambulatory Visit (INDEPENDENT_AMBULATORY_CARE_PROVIDER_SITE_OTHER): Payer: Medicaid Other | Admitting: Internal Medicine

## 2013-01-31 VITALS — BP 159/102 | HR 69 | Ht 70.0 in | Wt 229.6 lb

## 2013-01-31 DIAGNOSIS — I10 Essential (primary) hypertension: Secondary | ICD-10-CM

## 2013-01-31 DIAGNOSIS — I4891 Unspecified atrial fibrillation: Secondary | ICD-10-CM

## 2013-01-31 DIAGNOSIS — I421 Obstructive hypertrophic cardiomyopathy: Secondary | ICD-10-CM

## 2013-01-31 DIAGNOSIS — Z95810 Presence of automatic (implantable) cardiac defibrillator: Secondary | ICD-10-CM

## 2013-01-31 DIAGNOSIS — I4821 Permanent atrial fibrillation: Secondary | ICD-10-CM

## 2013-01-31 DIAGNOSIS — I442 Atrioventricular block, complete: Secondary | ICD-10-CM

## 2013-01-31 MED ORDER — LISINOPRIL-HYDROCHLOROTHIAZIDE 20-12.5 MG PO TABS: 1.0000 | ORAL_TABLET | Freq: Every day | ORAL | Status: DC

## 2013-01-31 MED ORDER — RIVAROXABAN 20 MG PO TABS: 20.0000 mg | ORAL_TABLET | Freq: Every day | ORAL | Status: DC

## 2013-01-31 NOTE — Progress Notes (Signed)
Patient Care Team: Aura Dials, MD as PCP - General (Family Medicine)   HPI  Charles Le is a 57 y.o. male  With hypertrophic cardiomyopathy complicated by permanent atrial arrhythmias. He also has complete heart block and is status post ICD implantation for primary prevention.     Denies chest ain or sob   His blood pressures at home have been running in the 150/100 range.  Has some daytime somnolence but no significant snoring  Past Medical History  Diagnosis Date  . Hypertrophic obstructive cardiomyopathy     hypertrophic  . Aflutter     atypical-left sided  . DDD-ICD     [facility] [address] [phone] ) dual chamber ICD implanted May 24, 2006)  . Atrioventricular block, complete     complete  . Hyperlipidemia   . Heart murmur   . Insomnia   . Anxiety   . Hernia     Past Surgical History  Procedure Laterality Date  . Hip surgery      replacement  . Icd implantation      ICD- St Jude  . Cardioversion  2006  . Ventral hernia repair  11/22/2011    Procedure: HERNIA REPAIR VENTRAL ADULT;  Surgeon: Shelly Rubenstein, MD;  Location: [facility] OR;  Service: General;  Laterality: N/A;  Ventral hernia repair with mesh    Current Outpatient Prescriptions  Medication Sig Dispense Refill  . ALPRAZolam (XANAX) 0.25 MG tablet Take 0.25 mg by mouth at bedtime as needed.        [name] atorvastatin (LIPITOR) 20 MG tablet Take 20 mg by mouth daily.      . ciclopirox (PENLAC) 8 % solution Apply topically at bedtime. Apply over nail and surrounding skin. Apply daily over previous coat. After seven (7) days, may remove with alcohol and continue cycle.       . esomeprazole (NEXIUM) 40 MG capsule Take 1 capsule (40 mg total) by mouth daily before breakfast.  30 capsule  5  . lisinopril (PRINIVIL,ZESTRIL) 10 MG tablet Take 1 tablet (10 mg total) by mouth daily.  90 tablet  3  . rivaroxaban (XARELTO) 10 MG TABS tablet Take 1 tablet (10 mg total) by mouth daily.  30 tablet  5  . traZODone  (DESYREL) 50 MG tablet Take 50 mg by mouth at bedtime.       No current facility-administered medications for this visit.    No Known Allergies  Review of Systems negative except from HPI and PMH  Physical Exam BP 159/102  Pulse 69  Ht 5\' 10"  (1.778 m)  Wt 229 lb 9.6 oz (104.146 kg)  BMI 32.94 kg/m2 Well developed and well nourished in no acute distress HENT normal E scleral and icterus clear Neck Supple JVP flat; carotids brisk and full Clear to ausculation Device pocket well healed; without hematoma or erythema Regular rate and rhythm, no murmurs gallops or rub Soft with active bowel sounds No clubbing cyanosis none Edema Alert and oriented, grossly normal motor and sensory function Skin Warm and Dry  ECG demonstrates atrial fibrillation with underlying ventricular pacing  Assessment and  Plan

## 2013-01-31 NOTE — Assessment & Plan Note (Signed)
Poorly controlled. We will change his lisinopril 10--20/12.5 He will need a metabolic profile in about 2 weeks time He has the body habitus for sleep apnea. He is not sure however, nor is his wife, that he has significant symptoms enuf 2 point that way

## 2013-01-31 NOTE — Assessment & Plan Note (Signed)
The patient has HCM. Genetic testing has not been undertaken. There are multiple   family members who've "a problem". He is on Medicaid.we will have been discussed with Gene DX costs of testing. We discussed the implications of health insurance as well as life insurance.

## 2013-01-31 NOTE — Assessment & Plan Note (Signed)
The patient's device was interrogated.  The information was reviewed. No changes were made in the programming.   We have reviewed the benefits and risks of generator replacement.  These include but are not limited to lead fracture and infection.  The patient understands, agrees and is willing to proceed.    

## 2013-01-31 NOTE — Assessment & Plan Note (Signed)
His anticoagulation dose is inadequate. He was being treated with 10 mg. We'll increase this to 20 mg. Renal function last year with normal

## 2013-01-31 NOTE — Patient Instructions (Addendum)
Your physician has recommended you make the following change in your medication: START Lisinopril/HCTZ 20 mg/12.5 mg daily.  STOP your Lisinopril  Your physician recommends that you return for lab work in: 2 weeks (bmet, cbc, pt/inr)  You are scheduled for your generator change on 02/21/13

## 2013-02-13 ENCOUNTER — Encounter (HOSPITAL_COMMUNITY): Payer: Self-pay

## 2013-02-14 ENCOUNTER — Other Ambulatory Visit: Payer: Medicaid Other

## 2013-02-15 ENCOUNTER — Ambulatory Visit (INDEPENDENT_AMBULATORY_CARE_PROVIDER_SITE_OTHER): Payer: Medicaid Other | Admitting: *Deleted

## 2013-02-15 DIAGNOSIS — I442 Atrioventricular block, complete: Secondary | ICD-10-CM

## 2013-02-15 DIAGNOSIS — I421 Obstructive hypertrophic cardiomyopathy: Secondary | ICD-10-CM

## 2013-02-16 LAB — BASIC METABOLIC PANEL
CO2: 27 mEq/L (ref 19–32)
Calcium: 9.5 mg/dL (ref 8.4–10.5)
Creatinine, Ser: 1.3 mg/dL (ref 0.4–1.5)

## 2013-02-16 LAB — CBC WITH DIFFERENTIAL/PLATELET
Basophils Relative: 0.4 % (ref 0.0–3.0)
Eosinophils Absolute: 0.4 10*3/uL (ref 0.0–0.7)
Lymphocytes Relative: 27.1 % (ref 12.0–46.0)
MCHC: 33.9 g/dL (ref 30.0–36.0)
Neutrophils Relative %: 58.9 % (ref 43.0–77.0)
RBC: 5.01 Mil/uL (ref 4.22–5.81)
WBC: 9.2 10*3/uL (ref 4.5–10.5)

## 2013-02-19 ENCOUNTER — Encounter: Payer: Self-pay | Admitting: Internal Medicine

## 2013-02-20 MED ORDER — CEFAZOLIN SODIUM-DEXTROSE 2-3 GM-% IV SOLR
2.0000 g | INTRAVENOUS | Status: DC
  Filled 2013-02-20 (×2): qty 50

## 2013-02-20 MED ORDER — GENTAMICIN SULFATE 40 MG/ML IJ SOLN
80.0000 mg | INTRAMUSCULAR | Status: DC
  Filled 2013-02-20: qty 2

## 2013-02-21 ENCOUNTER — Telehealth: Payer: Self-pay | Admitting: Internal Medicine

## 2013-02-21 ENCOUNTER — Ambulatory Visit (HOSPITAL_COMMUNITY): Payer: Medicaid Other

## 2013-02-21 ENCOUNTER — Encounter (HOSPITAL_COMMUNITY)
Admission: RE | Disposition: A | Discharge status: Home / Self Care | Payer: Self-pay | Source: Ambulatory Visit | Attending: Internal Medicine

## 2013-02-21 ENCOUNTER — Ambulatory Visit (HOSPITAL_COMMUNITY)
Admission: RE | Admit: 2013-02-21 | Discharge: 2013-02-21 | Disposition: A | Discharge status: Home / Self Care | Payer: Medicaid Other | Source: Ambulatory Visit | Attending: Internal Medicine | Admitting: Internal Medicine

## 2013-02-21 DIAGNOSIS — Z95810 Presence of automatic (implantable) cardiac defibrillator: Secondary | ICD-10-CM

## 2013-02-21 DIAGNOSIS — Z7901 Long term (current) use of anticoagulants: Secondary | ICD-10-CM | POA: Insufficient documentation

## 2013-02-21 DIAGNOSIS — G4700 Insomnia, unspecified: Secondary | ICD-10-CM | POA: Insufficient documentation

## 2013-02-21 DIAGNOSIS — I421 Obstructive hypertrophic cardiomyopathy: Secondary | ICD-10-CM

## 2013-02-21 DIAGNOSIS — I4892 Unspecified atrial flutter: Secondary | ICD-10-CM | POA: Insufficient documentation

## 2013-02-21 DIAGNOSIS — E785 Hyperlipidemia, unspecified: Secondary | ICD-10-CM | POA: Insufficient documentation

## 2013-02-21 DIAGNOSIS — F411 Generalized anxiety disorder: Secondary | ICD-10-CM | POA: Insufficient documentation

## 2013-02-21 DIAGNOSIS — I10 Essential (primary) hypertension: Secondary | ICD-10-CM

## 2013-02-21 DIAGNOSIS — Z79899 Other long term (current) drug therapy: Secondary | ICD-10-CM | POA: Insufficient documentation

## 2013-02-21 DIAGNOSIS — I442 Atrioventricular block, complete: Secondary | ICD-10-CM

## 2013-02-21 DIAGNOSIS — Z4502 Encounter for adjustment and management of automatic implantable cardiac defibrillator: Secondary | ICD-10-CM | POA: Insufficient documentation

## 2013-02-21 DIAGNOSIS — I4821 Permanent atrial fibrillation: Secondary | ICD-10-CM

## 2013-02-21 DIAGNOSIS — R011 Cardiac murmur, unspecified: Secondary | ICD-10-CM | POA: Insufficient documentation

## 2013-02-21 HISTORY — PX: IMPLANTABLE CARDIOVERTER DEFIBRILLATOR (ICD) GENERATOR CHANGE: SHX5469

## 2013-02-21 LAB — SURGICAL PCR SCREEN: Staphylococcus aureus: NEGATIVE

## 2013-02-21 SURGERY — ICD GENERATOR CHANGE
Anesthesia: LOCAL

## 2013-02-21 MED ORDER — FENTANYL CITRATE 0.05 MG/ML IJ SOLN
INTRAMUSCULAR | Status: AC
  Filled 2013-02-21: qty 2

## 2013-02-21 MED ORDER — MUPIROCIN 2 % EX OINT
TOPICAL_OINTMENT | CUTANEOUS | Status: AC
  Filled 2013-02-21: qty 22

## 2013-02-21 MED ORDER — MIDAZOLAM HCL 5 MG/5ML IJ SOLN
INTRAMUSCULAR | Status: AC
  Filled 2013-02-21: qty 5

## 2013-02-21 MED ORDER — MUPIROCIN 2 % EX OINT
TOPICAL_OINTMENT | Freq: Two times a day (BID) | CUTANEOUS | Status: DC
  Filled 2013-02-21: qty 22

## 2013-02-21 MED ORDER — SODIUM CHLORIDE 0.9 % IV SOLN
INTRAVENOUS | Status: DC
  Administered 2013-02-21: 1000 mL via INTRAVENOUS

## 2013-02-21 MED ORDER — LIDOCAINE HCL (PF) 1 % IJ SOLN
INTRAMUSCULAR | Status: AC
  Filled 2013-02-21: qty 60

## 2013-02-21 MED ORDER — ONDANSETRON HCL 4 MG/2ML IJ SOLN: 4.0000 mg | Freq: Four times a day (QID) | INTRAMUSCULAR | Status: DC | PRN

## 2013-02-21 MED ORDER — ACETAMINOPHEN 325 MG PO TABS: 325.0000 mg | ORAL_TABLET | ORAL | Status: DC | PRN

## 2013-02-21 MED ORDER — SODIUM CHLORIDE 0.9 % IV SOLN: INTRAVENOUS | Status: AC

## 2013-02-21 MED ORDER — CHLORHEXIDINE GLUCONATE 4 % EX LIQD
60.0000 mL | Freq: Once | CUTANEOUS | Status: DC
  Filled 2013-02-21: qty 60

## 2013-02-21 NOTE — Telephone Encounter (Signed)
New problem   Pt experiencing soreness and pain since getting d-fib today w/dr Graciela Husbands

## 2013-02-21 NOTE — Interval H&P Note (Signed)
History and Physical Interval Note:  02/21/2013 7:56 AM  Charles Le  has presented today for surgery, with the diagnosis of av block  The various methods of treatment have been discussed with the patient and family. After consideration of risks, benefits and other options for treatment, the patient has consented to  Procedure(s): ICD GENERATOR CHANGE (N/A) as a surgical intervention .  The patient's history has been reviewed, patient examined, no change in status, stable for surgery.  I have reviewed the patient's chart and labs.  Questions were answered to the patient's satisfaction.     Sherryl Manges

## 2013-02-21 NOTE — CV Procedure (Signed)
.  skop BANKS CHAIKIN [id number]  [id number]  Preop Dx: HCM with symcope device at EOL Postop Dx same/   Procedure:generator replacement and lead asssessment  Cx: None   Dictation number [id number]  Sherryl Manges, MD 02/21/2013 9:36 AM

## 2013-02-21 NOTE — Progress Notes (Signed)
DR Graciela Husbands NOTIFIED OF LEFT CHEST WITH SOME SWELLING UNDER PRESSURE DRESSING AND DR Graciela Husbands IN TO SEE CLIENT AND CHECKED LEFT CHEST AND OK TO D/C HOME PER DR Graciela Husbands

## 2013-02-21 NOTE — H&P (View-Only) (Signed)
Patient Care Team: David E Bouska, MD as PCP - General (Family Medicine)   HPI  Charles Le is a 56 y.o. male  With hypertrophic cardiomyopathy complicated by permanent atrial arrhythmias. He also has complete heart block and is status post ICD implantation for primary prevention.     Denies chest ain or sob   His blood pressures at home have been running in the 150/100 range.  Has some daytime somnolence but no significant snoring  Past Medical History  Diagnosis Date  . Hypertrophic obstructive cardiomyopathy     hypertrophic  . Aflutter     atypical-left sided  . DDD-ICD     [employer] [address] DR [id number] ) dual chamber ICD implanted May 24, 2006)  . Atrioventricular block, complete     complete  . Hyperlipidemia   . Heart murmur   . Insomnia   . Anxiety   . Hernia     Past Surgical History  Procedure Laterality Date  . Hip surgery      replacement  . Icd implantation      ICD- St Jude  . Cardioversion  2006  . Ventral hernia repair  11/22/2011    Procedure: HERNIA REPAIR VENTRAL ADULT;  Surgeon: Douglas A Blackman, MD;  Location: [facility] OR;  Service: General;  Laterality: N/A;  Ventral hernia repair with mesh    Current Outpatient Prescriptions  Medication Sig Dispense Refill  . ALPRAZolam (XANAX) 0.25 MG tablet Take 0.25 mg by mouth at bedtime as needed.        . atorvastatin (LIPITOR) 20 MG tablet Take 20 mg by mouth daily.      . ciclopirox (PENLAC) 8 % solution Apply topically at bedtime. Apply over nail and surrounding skin. Apply daily over previous coat. After seven (7) days, may remove with alcohol and continue cycle.       . esomeprazole (NEXIUM) 40 MG capsule Take 1 capsule (40 mg total) by mouth daily before breakfast.  30 capsule  5  . lisinopril (PRINIVIL,ZESTRIL) 10 MG tablet Take 1 tablet (10 mg total) by mouth daily.  90 tablet  3  . rivaroxaban (XARELTO) 10 MG TABS tablet Take 1 tablet (10 mg total) by mouth daily.  30 tablet  5  . traZODone  (DESYREL) 50 MG tablet Take 50 mg by mouth at bedtime.       No current facility-administered medications for this visit.    No Known Allergies  Review of Systems negative except from HPI and PMH  Physical Exam BP 159/102  Pulse 69  Ht 5' 10" (1.778 m)  Wt 229 lb 9.6 oz (104.146 kg)  BMI 32.94 kg/m2 Well developed and well nourished in no acute distress HENT normal E scleral and icterus clear Neck Supple JVP flat; carotids brisk and full Clear to ausculation Device pocket well healed; without hematoma or erythema Regular rate and rhythm, no murmurs gallops or rub Soft with active bowel sounds No clubbing cyanosis none Edema Alert and oriented, grossly normal motor and sensory function Skin Warm and Dry  ECG demonstrates atrial fibrillation with underlying ventricular pacing  Assessment and  Plan  

## 2013-02-21 NOTE — Telephone Encounter (Signed)
**Note De-Identified Charles Le Obfuscation** Per Dr. Ladona Ridgel, Pts wife is advised that pt may take Motrin or Tylenol for his pain, she verbalized understanding.

## 2013-02-21 NOTE — Op Note (Signed)
NAME:  Charles Le, Charles Le NO.:  [id number]  MEDICAL RECORD NO.:  [id number]  LOCATION:  [facility]                         FACILITY:  [facility]  PHYSICIAN:  Duke Salvia, MD, FACCDATE OF BIRTH:  [birth year]  DATE OF PROCEDURE:  02/21/2013 DATE OF DISCHARGE:  02/21/2013                              OPERATIVE REPORT   PREOPERATIVE DIAGNOSES:  Hypertrophic cardiomyopathy, complete heart block, previously implanted implantable cardioverter-defibrillator, now at elective replacement indicator.  POSTOPERATIVE DIAGNOSES:  Hypertrophic cardiomyopathy, complete heart block, previously implanted implantable cardioverter-defibrillator, now at elective replacement indicator.  PROCEDURES:  Explantation of a previously implanted device, insertion of a new device with testing of the lead system.  DESCRIPTION OF PROCEDURE:  Following obtaining informed consent, the patient was brought to the Electrophysiology Laboratory and placed on the fluoroscopic table in supine position.  After routine prep and drape, fluoroscopy was obtained demonstrating mechanical integrity of the Riata lead.  Thereafter, attention was turned to the generator replacement.  A local anesthesia was administered just caudal to the previous incision and carried down to layer of the device pocket using sharp dissection and electrocautery.  The pocket was opened.  The device was freed up and the device was explanted.  We then transitioned the leads rapidly to a brief PSA assessment and attached the leads to a new St. Jude Ellipse DR ICD, serial number [id number].  Through the device, the bipolar atrial fibrillation wave was 1.7 with pacing impedance of 400 ohms.  There was no intrinsic ventricular rhythm, the impedance was 40 and threshold of 1.25 volts at 0.5 milliseconds.  High-voltage impedance was 51 ohms. With these acceptable parameters recorded, the device was implanted. The pocket was copiously irrigated with  antibiotic containing saline solution.  Hemostasis was assured.  Leads and the pulse generator were placed in the pocket, secured to the prepectoral fascia.  The wound was then closed in 2 layers in normal fashion.  Surgicel still having been placed at the cephalad aspect of the pocket.  The patient tolerated the procedure without apparent complication.     Duke Salvia, MD, [facility]     SCK/MEDQ  D:  02/21/2013  T:  02/21/2013  Job:  [id number]

## 2013-02-23 ENCOUNTER — Telehealth: Payer: Self-pay | Admitting: *Deleted

## 2013-02-23 NOTE — Telephone Encounter (Signed)
In office - pressure dressing removed, no hematoma noted.  Wound check scheduled for 03/05/13.

## 2013-02-27 ENCOUNTER — Telehealth: Payer: Self-pay | Admitting: Internal Medicine

## 2013-02-27 NOTE — Telephone Encounter (Signed)
New problem    Wound area is warm to the touch but is not bleeding and pt is not running a fever but they think it may be infected because the area is warm to the touch.  Pts fiance states they will be in court but you can leave a detailed msg w/instructions

## 2013-02-27 NOTE — Telephone Encounter (Signed)
Spoke with pt, explained to pt will need to look at the area to be able to tell what is going on. Offered appt tomorrow with the device nurses when dr Graciela Husbands is here. The pt is unable to come tomorrow and will wait until his scheduled appt 03-05-13. He will call with further concerns.

## 2013-02-28 ENCOUNTER — Ambulatory Visit: Payer: Medicaid Other

## 2013-02-28 ENCOUNTER — Encounter: Payer: Medicaid Other | Admitting: Internal Medicine

## 2013-03-05 ENCOUNTER — Ambulatory Visit: Payer: Medicaid Other

## 2013-03-05 ENCOUNTER — Ambulatory Visit (INDEPENDENT_AMBULATORY_CARE_PROVIDER_SITE_OTHER): Payer: Medicaid Other | Admitting: *Deleted

## 2013-03-05 ENCOUNTER — Encounter: Payer: Self-pay | Admitting: Internal Medicine

## 2013-03-05 ENCOUNTER — Other Ambulatory Visit: Payer: Self-pay

## 2013-03-05 DIAGNOSIS — I442 Atrioventricular block, complete: Secondary | ICD-10-CM

## 2013-03-05 NOTE — Progress Notes (Signed)
Pacemaker check in clinic  

## 2013-03-06 LAB — ICD DEVICE OBSERVATION
ATRIAL PACING ICD: 0 pct
BRDY-0002RV: 70 {beats}/min
BRDY-0004RV: 120 {beats}/min
DEV-0020ICD: NEGATIVE
DEVICE MODEL ICD: 1102557
FVT: 0
MODE SWITCH EPISODES: 0
PACEART VT: 0
RV LEAD IMPEDENCE ICD: 540 Ohm
TOT-0007: 0
VENTRICULAR PACING ICD: 99.7 pct

## 2013-06-11 ENCOUNTER — Encounter: Payer: Medicaid Other | Admitting: *Deleted

## 2013-09-25 ENCOUNTER — Other Ambulatory Visit: Payer: Self-pay

## 2013-09-25 MED ORDER — RIVAROXABAN 20 MG PO TABS: 20.0000 mg | ORAL_TABLET | Freq: Every day | ORAL | Status: DC

## 2013-09-25 MED ORDER — LISINOPRIL-HYDROCHLOROTHIAZIDE 20-12.5 MG PO TABS: 1.0000 | ORAL_TABLET | Freq: Every day | ORAL | Status: DC

## 2013-10-09 ENCOUNTER — Ambulatory Visit (INDEPENDENT_AMBULATORY_CARE_PROVIDER_SITE_OTHER): Payer: Medicaid Other

## 2013-10-09 ENCOUNTER — Encounter: Payer: Self-pay | Admitting: Internal Medicine

## 2013-10-09 DIAGNOSIS — I421 Obstructive hypertrophic cardiomyopathy: Secondary | ICD-10-CM

## 2013-10-09 DIAGNOSIS — Z95810 Presence of automatic (implantable) cardiac defibrillator: Secondary | ICD-10-CM

## 2013-10-15 LAB — MDC_IDC_ENUM_SESS_TYPE_REMOTE
Battery Remaining Longevity: 78 mo
Brady Statistic RV Percent Paced: 100 %
Lead Channel Impedance Value: 540 Ohm
Lead Channel Setting Sensing Sensitivity: 2 mV
Zone Setting Detection Interval: 270 ms

## 2013-10-19 ENCOUNTER — Encounter: Payer: Self-pay | Admitting: *Deleted

## 2013-11-15 ENCOUNTER — Telehealth: Payer: Self-pay | Admitting: Internal Medicine

## 2013-11-15 NOTE — Telephone Encounter (Signed)
New message   Fax sent on 1/8 .    Office calling checking on status .

## 2013-11-15 NOTE — Telephone Encounter (Signed)
I spoke with Shanda Bumps at [facility]. We have received the fax for cardiac clearance. They have not scheduled his colonoscopy yet.  She is aware Dr. Graciela Husbands is out of the office until 11/20/13 & that we have received the fax for clearance  Will forward this to Dr. Graciela Husbands & Evon Slack RN

## 2013-11-21 NOTE — Telephone Encounter (Signed)
Faxed clearance today.

## 2013-11-27 ENCOUNTER — Telehealth: Payer: Self-pay | Admitting: Internal Medicine

## 2013-11-27 ENCOUNTER — Telehealth: Payer: Self-pay | Admitting: *Deleted

## 2013-11-27 NOTE — Telephone Encounter (Signed)
Faxed pre-operative clearance to [facility] for: right hip: THA-AA on 12/18/13, per Dr. Graciela Husbands.

## 2013-12-10 ENCOUNTER — Encounter (HOSPITAL_COMMUNITY): Payer: Self-pay | Admitting: Pharmacy Technician

## 2013-12-11 ENCOUNTER — Encounter (HOSPITAL_COMMUNITY)
Admission: RE | Admit: 2013-12-11 | Discharge: 2013-12-11 | Disposition: A | Discharge status: Home / Self Care | Payer: Medicaid Other | Source: Ambulatory Visit | Attending: Orthopedic Surgery | Admitting: Orthopedic Surgery

## 2013-12-11 ENCOUNTER — Encounter (HOSPITAL_COMMUNITY): Payer: Self-pay

## 2013-12-11 DIAGNOSIS — Z01812 Encounter for preprocedural laboratory examination: Secondary | ICD-10-CM | POA: Insufficient documentation

## 2013-12-11 HISTORY — DX: Presence of automatic (implantable) cardiac defibrillator: Z95.810

## 2013-12-11 HISTORY — DX: Unspecified osteoarthritis, unspecified site: M19.90

## 2013-12-11 HISTORY — DX: Gastro-esophageal reflux disease without esophagitis: K21.9

## 2013-12-11 HISTORY — DX: Personal history of other diseases of the digestive system: Z87.19

## 2013-12-11 HISTORY — DX: Other specified postprocedural states: R11.2

## 2013-12-11 HISTORY — DX: Other specified postprocedural states: Z98.890

## 2013-12-11 LAB — CBC
HEMATOCRIT: 47.2 % (ref 39.0–52.0)
Hemoglobin: 16.8 g/dL (ref 13.0–17.0)
MCH: 33.7 pg (ref 26.0–34.0)
MCHC: 35.6 g/dL (ref 30.0–36.0)
MCV: 94.6 fL (ref 78.0–100.0)
PLATELETS: 225 10*3/uL (ref 150–400)
RBC: 4.99 MIL/uL (ref 4.22–5.81)
RDW: 12.6 % (ref 11.5–15.5)
WBC: 9.4 10*3/uL (ref 4.0–10.5)

## 2013-12-11 LAB — URINALYSIS, ROUTINE W REFLEX MICROSCOPIC
Glucose, UA: NEGATIVE mg/dL
HGB URINE DIPSTICK: NEGATIVE
Ketones, ur: NEGATIVE mg/dL
Leukocytes, UA: NEGATIVE
NITRITE: NEGATIVE
PROTEIN: NEGATIVE mg/dL
SPECIFIC GRAVITY, URINE: 1.028 (ref 1.005–1.030)
Urobilinogen, UA: 1 mg/dL (ref 0.0–1.0)
pH: 6 (ref 5.0–8.0)

## 2013-12-11 LAB — BASIC METABOLIC PANEL
BUN: 19 mg/dL (ref 6–23)
CALCIUM: 9.9 mg/dL (ref 8.4–10.5)
CHLORIDE: 99 meq/L (ref 96–112)
CO2: 25 mEq/L (ref 19–32)
CREATININE: 1.12 mg/dL (ref 0.50–1.35)
GFR calc Af Amer: 84 mL/min — ABNORMAL LOW (ref >90)
GFR calc non Af Amer: 72 mL/min — ABNORMAL LOW (ref >90)
GLUCOSE: 104 mg/dL — AB (ref 70–99)
Potassium: 4.2 mEq/L (ref 3.7–5.3)
Sodium: 138 mEq/L (ref 137–147)

## 2013-12-11 LAB — PROTIME-INR
INR: 1.44 (ref 0.00–1.49)
Prothrombin Time: 17.2 seconds — ABNORMAL HIGH (ref 11.6–15.2)

## 2013-12-11 LAB — SURGICAL PCR SCREEN
MRSA, PCR: NEGATIVE
Staphylococcus aureus: NEGATIVE

## 2013-12-11 LAB — APTT: aPTT: 38 seconds — ABNORMAL HIGH (ref 24–37)

## 2013-12-11 NOTE — Patient Instructions (Addendum)
  YOUR SURGERY IS SCHEDULED AT [facility]  ON:  Tuesday  2/17  REPORT TO  SHORT STAY CENTER AT:  9:00 AM      PHONE # FOR SHORT STAY IS [phone]  DO NOT EAT OR DRINK ANYTHING AFTER MIDNIGHT THE NIGHT BEFORE YOUR SURGERY.  YOU MAY BRUSH YOUR TEETH, RINSE OUT YOUR MOUTH--BUT NO WATER, NO FOOD, NO CHEWING GUM, NO MINTS, NO CANDIES, NO CHEWING TOBACCO.  PLEASE TAKE THE FOLLOWING MEDICATIONS THE AM OF YOUR SURGERY WITH A FEW SIPS OF WATER:  ALPRAZOLAM ( XANAX) IF NEEDED FOR ANXIETY / PANIC ATTACK - AS LONG AS SOMEONE ELSE IS DRIVING YOU TO THE HOSPITAL.  DO NOT BRING VALUABLES, MONEY, CREDIT CARDS.  DO NOT WEAR JEWELRY, MAKE-UP, NAIL POLISH AND NO METAL PINS OR CLIPS IN YOUR HAIR. CONTACT LENS, DENTURES / PARTIALS, GLASSES SHOULD NOT BE WORN TO SURGERY AND IN MOST CASES-HEARING AIDS WILL NEED TO BE REMOVED.  BRING YOUR GLASSES CASE, ANY EQUIPMENT NEEDED FOR YOUR CONTACT LENS. FOR PATIENTS ADMITTED TO THE HOSPITAL--CHECK OUT TIME THE DAY OF DISCHARGE IS 11:00 AM.  ALL INPATIENT ROOMS ARE PRIVATE - WITH BATHROOM, TELEPHONE, TELEVISION AND WIFI INTERNET.                                                    PLEASE READ OVER ANY  FACT SHEETS THAT YOU WERE GIVEN: MRSA INFORMATION,  INCENTIVE SPIROMETER INFORMATION.  FAILURE TO FOLLOW THESE INSTRUCTIONS MAY RESULT IN THE CANCELLATION OF YOUR SURGERY. PLEASE BE AWARE THAT YOU MAY NEED ADDITIONAL BLOOD DRAWN DAY OF YOUR SURGERY  PATIENT SIGNATURE_________________________________

## 2013-12-11 NOTE — Pre-Procedure Instructions (Signed)
EKG AND CARDIOLOGY OFFICE NOTES ARE IN EPIC FROM DR. KLEIN - FROM 4-2/14.  SURGICAL CLEARANCE ON CHART FROM DR. KLEIN. PERIOPERATIVE PRESCRIPTION FOR IMPLANTED CARDIAC DEVICE PROGRAMMING  ( PT HAS ICD ) ON CHART FROM DR. KLEIN - "PROCEDURE SHOULD NOT INTERFERE WITH DEVICE FUNCTION; NO DEVICE REPROGRAMMING OR MAGNET PLACEMENT NEEDED." CXR REPORT IN EPIC FROM 02-21-13.

## 2013-12-13 NOTE — H&P (Signed)
TOTAL HIP ADMISSION H&P  Patient is admitted for right total hip arthroplasty, anterior approach.  Subjective:  Chief Complaint: Right hip OA / pain  HPI: Charles Le, 58 y.o. male, has a history of pain and functional disability in the right hip(s) due to arthritis and patient has failed non-surgical conservative treatments for greater than 12 weeks to include NSAID's and/or analgesics and activity modification.  Onset of symptoms was gradual starting 5 years ago with rapidlly worsening over the last 9 months.The patient noted prior procedures of the hip to include arthroplasty on the right hip in December 2010.  Patient currently rates pain in the right hip at 8 out of 10 with activity. Patient has worsening of pain with activity and weight bearing, trendelenberg gait, pain that interfers with activities of daily living and pain with passive range of motion. Patient has evidence of periarticular osteophytes and joint space narrowing by imaging studies. This condition presents safety issues increasing the risk of falls.  There is no current active infection.   Risks, benefits and expectations were discussed with the patient.  Risks including but not limited to the risk of anesthesia, blood clots, nerve damage, blood vessel damage, failure of the prosthesis, infection and up to and including death.  Patient understand the risks, benefits and expectations and wishes to proceed with surgery.   D/C Plans:      Home with HHPT  Post-op Meds:     No Rx given  Tranexamic Acid:   Not to be given - CAD, had a defibrillator  Decadron:    To be given  FYI:    On Xarelto prior to surgery, return to dose after surgery  Norco post-op   Patient Active Problem List   Diagnosis Date Noted  . Hypertension 02/10/2012  . Ventral hernia 08/24/2011  . Abdominal wall mass of left upper quadrant 08/16/2011  . Right shoulder pain 03/10/2011  . ICD-DDD st judes  RIATA 01/27/2011  . RiATA  lead 01/27/2011  .  Permanent atrial fibrillation   . INCREASED BLOOD PRESSURE 09/30/2009  . AV BLOCK, COMPLETE 03/04/2009  . Hypertrophic obstructive cardiomyopathy 12/11/2008   Past Medical History  Diagnosis Date  . Hypertrophic obstructive cardiomyopathy     hypertrophic  . Aflutter     atypical-left sided  . DDD-ICD     [facility] [address] DR [phone] ) dual chamber ICD implanted May 24, 2006)  . Atrioventricular block, complete     complete  . Hyperlipidemia   . Heart murmur   . Insomnia   . Anxiety   . Automatic implantable cardioverter-defibrillator in situ     GENERATOR CHANGE 2014  . GERD (gastroesophageal reflux disease)   . H/O hiatal hernia   . Arthritis     OA AND PAIN RT HIP;  S/P LEFT TOTAL HIP REPLACEMENT  . PONV (postoperative nausea and vomiting)     A LITTLE NAUSEA AFTER PREVIOUS HIP REPLACEMENT    Past Surgical History  Procedure Laterality Date  . Hip surgery      replacement  . Icd implantation      ICD- St Jude  . Cardioversion  2006  . Ventral hernia repair  11/22/2011    Procedure: HERNIA REPAIR VENTRAL ADULT;  Surgeon: Shelly Rubenstein, MD;  Location: [facility] OR;  Service: General;  Laterality: N/A;  Ventral hernia repair with mesh  . Hernia repair      VENTRAL HERNIA REPAIR 11/22/11  . Joint replacement  Oct 07, 2009  LEFT TOTAL HIP ARTHROPLASTY    No prescriptions prior to admission   No Known Allergies   History  Substance Use Topics  . Smoking status: Current Every Day Smoker -- 0.50 packs/day for 18 years    Types: Cigarettes  . Smokeless tobacco: Never Used  . Alcohol Use: Yes     Comment: 1 - 2 times per week    Family History  Problem Relation Age of Onset  . Hypertrophic cardiomyopathy Paternal Grandmother   . Diabetes Paternal Grandmother   . Heart attack Father   . Diabetes Father   . Hypertension Neg Hx   . Cancer Mother     breast  . Cancer Maternal Grandfather     prostate     Review of Systems  Constitutional: Negative.   HENT:  Negative.   Eyes: Negative.   Respiratory: Negative.   Cardiovascular: Negative.   Gastrointestinal: Positive for heartburn.  Musculoskeletal: Positive for joint pain.  Skin: Negative.   Neurological: Negative.   Endo/Heme/Allergies: Negative.   Psychiatric/Behavioral: The patient is nervous/anxious and has insomnia.     Objective:  Physical Exam  Constitutional: He is oriented to person, place, and time. He appears well-developed and well-nourished.  HENT:  Head: Normocephalic and atraumatic.  Mouth/Throat: Oropharynx is clear and moist.  Eyes: Pupils are equal, round, and reactive to light.  Neck: Neck supple. No JVD present. No tracheal deviation present. No thyromegaly present.  Cardiovascular: Normal rate, regular rhythm and intact distal pulses.   Murmur heard. Respiratory: Effort normal and breath sounds normal. No stridor. No respiratory distress. He has no wheezes.  GI: Soft. There is no tenderness. There is no guarding.  Musculoskeletal:       Right hip: He exhibits decreased range of motion, decreased strength, tenderness and bony tenderness. He exhibits no swelling, no deformity and no laceration.  Lymphadenopathy:    He has no cervical adenopathy.  Neurological: He is alert and oriented to person, place, and time.  Skin: Skin is warm and dry.  Psychiatric: He has a normal mood and affect.    Labs:  Estimated body mass index is 31.39 kg/(m^2) as calculated from the following:   Height as of 02/21/13: 5\' 11"  (1.803 m).   Weight as of 02/21/13: 102.059 kg (225 lb).   Imaging Review Plain radiographs demonstrate severe degenerative joint disease of the right hip(s). The bone quality appears to be good for age and reported activity level.  Assessment/Plan:  End stage arthritis, right hip(s)  The patient history, physical examination, clinical judgement of the provider and imaging studies are consistent with end stage degenerative joint disease of the right hip(s)  and total hip arthroplasty is deemed medically necessary. The treatment options including medical management, injection therapy, arthroscopy and arthroplasty were discussed at length. The risks and benefits of total hip arthroplasty were presented and reviewed. The risks due to aseptic loosening, infection, stiffness, dislocation/subluxation,  thromboembolic complications and other imponderables were discussed.  The patient acknowledged the explanation, agreed to proceed with the plan and consent was signed. Patient is being admitted for inpatient treatment for surgery, pain control, PT, OT, prophylactic antibiotics, VTE prophylaxis, progressive ambulation and ADL's and discharge planning.The patient is planning to be discharged home with home health services.     Anastasio AuerbachMatthew S. Naliah Eddington   PAC  12/13/2013, 2:22 PM

## 2013-12-18 ENCOUNTER — Encounter (HOSPITAL_COMMUNITY)
Admission: RE | Disposition: A | Discharge status: Home Health | Payer: Self-pay | Source: Ambulatory Visit | Attending: Orthopedic Surgery

## 2013-12-18 ENCOUNTER — Inpatient Hospital Stay (HOSPITAL_COMMUNITY): Payer: Medicaid Other

## 2013-12-18 ENCOUNTER — Inpatient Hospital Stay (HOSPITAL_COMMUNITY): Payer: Medicaid Other | Admitting: Certified Registered Nurse Anesthetist

## 2013-12-18 ENCOUNTER — Encounter (HOSPITAL_COMMUNITY): Payer: Self-pay | Admitting: *Deleted

## 2013-12-18 ENCOUNTER — Inpatient Hospital Stay (HOSPITAL_COMMUNITY)
Admission: RE | Admit: 2013-12-18 | Discharge: 2013-12-19 | DRG: 470 | Disposition: A | Discharge status: Home Health | Payer: Medicaid Other | Source: Ambulatory Visit | Attending: Orthopedic Surgery | Admitting: Orthopedic Surgery

## 2013-12-18 ENCOUNTER — Encounter (HOSPITAL_COMMUNITY): Payer: Medicaid Other | Admitting: Certified Registered Nurse Anesthetist

## 2013-12-18 DIAGNOSIS — F17200 Nicotine dependence, unspecified, uncomplicated: Secondary | ICD-10-CM | POA: Diagnosis present

## 2013-12-18 DIAGNOSIS — M169 Osteoarthritis of hip, unspecified: Principal | ICD-10-CM | POA: Diagnosis present

## 2013-12-18 DIAGNOSIS — I2510 Atherosclerotic heart disease of native coronary artery without angina pectoris: Secondary | ICD-10-CM | POA: Diagnosis present

## 2013-12-18 DIAGNOSIS — E785 Hyperlipidemia, unspecified: Secondary | ICD-10-CM | POA: Diagnosis present

## 2013-12-18 DIAGNOSIS — I4891 Unspecified atrial fibrillation: Secondary | ICD-10-CM | POA: Diagnosis present

## 2013-12-18 DIAGNOSIS — F411 Generalized anxiety disorder: Secondary | ICD-10-CM | POA: Diagnosis present

## 2013-12-18 DIAGNOSIS — K219 Gastro-esophageal reflux disease without esophagitis: Secondary | ICD-10-CM | POA: Diagnosis present

## 2013-12-18 DIAGNOSIS — Z8249 Family history of ischemic heart disease and other diseases of the circulatory system: Secondary | ICD-10-CM

## 2013-12-18 DIAGNOSIS — Z01812 Encounter for preprocedural laboratory examination: Secondary | ICD-10-CM

## 2013-12-18 DIAGNOSIS — Z96649 Presence of unspecified artificial hip joint: Secondary | ICD-10-CM

## 2013-12-18 DIAGNOSIS — M1610 Unilateral primary osteoarthritis, unspecified hip: Principal | ICD-10-CM | POA: Diagnosis present

## 2013-12-18 DIAGNOSIS — Z95810 Presence of automatic (implantable) cardiac defibrillator: Secondary | ICD-10-CM

## 2013-12-18 DIAGNOSIS — Z96641 Presence of right artificial hip joint: Secondary | ICD-10-CM

## 2013-12-18 DIAGNOSIS — Z6831 Body mass index (BMI) 31.0-31.9, adult: Secondary | ICD-10-CM

## 2013-12-18 DIAGNOSIS — I10 Essential (primary) hypertension: Secondary | ICD-10-CM | POA: Diagnosis present

## 2013-12-18 DIAGNOSIS — I421 Obstructive hypertrophic cardiomyopathy: Secondary | ICD-10-CM | POA: Diagnosis present

## 2013-12-18 HISTORY — PX: TOTAL HIP ARTHROPLASTY: SHX124

## 2013-12-18 LAB — TYPE AND SCREEN
ABO/RH(D): A POS
ANTIBODY SCREEN: NEGATIVE

## 2013-12-18 LAB — PROTIME-INR
INR: 0.94 (ref 0.00–1.49)
PROTHROMBIN TIME: 12.4 s (ref 11.6–15.2)

## 2013-12-18 SURGERY — ARTHROPLASTY, HIP, TOTAL, ANTERIOR APPROACH
Anesthesia: General | Site: Hip | Laterality: Right

## 2013-12-18 MED ORDER — CEFAZOLIN SODIUM-DEXTROSE 2-3 GM-% IV SOLR
INTRAVENOUS | Status: AC
Start: 2013-12-18 — End: 2013-12-18
  Filled 2013-12-18: qty 50

## 2013-12-18 MED ORDER — RIVAROXABAN 20 MG PO TABS
20.0000 mg | ORAL_TABLET | Freq: Every day | ORAL | Status: DC
  Filled 2013-12-18: qty 1

## 2013-12-18 MED ORDER — GLYCOPYRROLATE 0.2 MG/ML IJ SOLN
INTRAMUSCULAR | Status: AC
  Filled 2013-12-18: qty 1

## 2013-12-18 MED ORDER — HYDROMORPHONE HCL PF 1 MG/ML IJ SOLN
0.2500 mg | INTRAMUSCULAR | Status: DC | PRN
  Administered 2013-12-18 (×2): 0.25 mg via INTRAVENOUS

## 2013-12-18 MED ORDER — STERILE WATER FOR IRRIGATION IR SOLN
Status: DC | PRN
  Administered 2013-12-18: 1500 mL

## 2013-12-18 MED ORDER — PHENYLEPHRINE HCL 10 MG/ML IJ SOLN
INTRAMUSCULAR | Status: AC
  Filled 2013-12-18: qty 1

## 2013-12-18 MED ORDER — METOCLOPRAMIDE HCL 5 MG/ML IJ SOLN: 5.0000 mg | Freq: Three times a day (TID) | INTRAMUSCULAR | Status: DC | PRN

## 2013-12-18 MED ORDER — DEXTROSE 5 % IV SOLN
500.0000 mg | Freq: Four times a day (QID) | INTRAVENOUS | Status: DC | PRN
Start: 2013-12-18 — End: 2013-12-19
  Administered 2013-12-18: 500 mg via INTRAVENOUS
  Filled 2013-12-18: qty 5

## 2013-12-18 MED ORDER — SUCCINYLCHOLINE CHLORIDE 20 MG/ML IJ SOLN
INTRAMUSCULAR | Status: DC | PRN
  Administered 2013-12-18: 140 mg via INTRAVENOUS

## 2013-12-18 MED ORDER — TRAZODONE HCL 50 MG PO TABS
50.0000 mg | ORAL_TABLET | Freq: Every evening | ORAL | Status: DC | PRN
  Filled 2013-12-18: qty 1

## 2013-12-18 MED ORDER — CHLORHEXIDINE GLUCONATE 4 % EX LIQD: 60.0000 mL | Freq: Once | CUTANEOUS | Status: DC

## 2013-12-18 MED ORDER — FERROUS SULFATE 325 (65 FE) MG PO TABS
325.0000 mg | ORAL_TABLET | Freq: Three times a day (TID) | ORAL | Status: DC
  Administered 2013-12-18 – 2013-12-19 (×3): 325 mg via ORAL
  Filled 2013-12-18 (×5): qty 1

## 2013-12-18 MED ORDER — PHENYLEPHRINE HCL 10 MG/ML IJ SOLN
INTRAMUSCULAR | Status: DC | PRN
  Administered 2013-12-18 (×5): 80 ug via INTRAVENOUS

## 2013-12-18 MED ORDER — HYDROCODONE-ACETAMINOPHEN 7.5-325 MG PO TABS
1.0000 | ORAL_TABLET | ORAL | Status: DC
  Administered 2013-12-18 – 2013-12-19 (×6): 2 via ORAL
  Filled 2013-12-18 (×6): qty 2

## 2013-12-18 MED ORDER — ONDANSETRON HCL 4 MG/2ML IJ SOLN: 4.0000 mg | Freq: Four times a day (QID) | INTRAMUSCULAR | Status: DC | PRN

## 2013-12-18 MED ORDER — HYDROMORPHONE HCL PF 1 MG/ML IJ SOLN
INTRAMUSCULAR | Status: AC
  Administered 2013-12-18: 1 mg
  Filled 2013-12-18: qty 1

## 2013-12-18 MED ORDER — PROPOFOL 10 MG/ML IV BOLUS
INTRAVENOUS | Status: AC
  Filled 2013-12-18: qty 20

## 2013-12-18 MED ORDER — LIDOCAINE HCL (CARDIAC) 20 MG/ML IV SOLN
INTRAVENOUS | Status: DC | PRN
  Administered 2013-12-18: 100 mg via INTRAVENOUS

## 2013-12-18 MED ORDER — POLYVINYL ALCOHOL 1.4 % OP SOLN
1.0000 [drp] | OPHTHALMIC | Status: DC | PRN
  Administered 2013-12-18: 1 [drp] via OPHTHALMIC
  Filled 2013-12-18: qty 15

## 2013-12-18 MED ORDER — ATORVASTATIN CALCIUM 20 MG PO TABS
20.0000 mg | ORAL_TABLET | Freq: Every evening | ORAL | Status: DC
  Administered 2013-12-18: 20 mg via ORAL
  Filled 2013-12-18 (×2): qty 1

## 2013-12-18 MED ORDER — ONDANSETRON HCL 4 MG/2ML IJ SOLN
INTRAMUSCULAR | Status: AC
  Filled 2013-12-18: qty 2

## 2013-12-18 MED ORDER — HYDROMORPHONE HCL PF 2 MG/ML IJ SOLN
INTRAMUSCULAR | Status: AC
  Filled 2013-12-18: qty 1

## 2013-12-18 MED ORDER — PHENYLEPHRINE 40 MCG/ML (10ML) SYRINGE FOR IV PUSH (FOR BLOOD PRESSURE SUPPORT)
PREFILLED_SYRINGE | INTRAVENOUS | Status: AC
  Filled 2013-12-18: qty 10

## 2013-12-18 MED ORDER — SODIUM CHLORIDE 0.9 % IV SOLN
100.0000 mL/h | INTRAVENOUS | Status: DC
  Administered 2013-12-18 – 2013-12-19 (×2): 100 mL/h via INTRAVENOUS
  Filled 2013-12-18 (×9): qty 1000

## 2013-12-18 MED ORDER — RIVAROXABAN 10 MG PO TABS
10.0000 mg | ORAL_TABLET | Freq: Every day | ORAL | Status: AC
  Administered 2013-12-19: 10 mg via ORAL
  Filled 2013-12-18: qty 1

## 2013-12-18 MED ORDER — LACTATED RINGERS IV SOLN
INTRAVENOUS | Status: DC
  Administered 2013-12-18: 1000 mL via INTRAVENOUS

## 2013-12-18 MED ORDER — DEXAMETHASONE SODIUM PHOSPHATE 10 MG/ML IJ SOLN
10.0000 mg | Freq: Once | INTRAMUSCULAR | Status: AC
  Administered 2013-12-19: 10 mg via INTRAVENOUS
  Filled 2013-12-18: qty 1

## 2013-12-18 MED ORDER — CEFAZOLIN SODIUM-DEXTROSE 2-3 GM-% IV SOLR
2.0000 g | Freq: Four times a day (QID) | INTRAVENOUS | Status: AC
  Administered 2013-12-18 – 2013-12-19 (×2): 2 g via INTRAVENOUS
  Filled 2013-12-18 (×2): qty 50

## 2013-12-18 MED ORDER — ACETAMINOPHEN 10 MG/ML IV SOLN: 1000.0000 mg | Freq: Once | INTRAVENOUS | Status: DC

## 2013-12-18 MED ORDER — CEFAZOLIN SODIUM-DEXTROSE 2-3 GM-% IV SOLR
2.0000 g | INTRAVENOUS | Status: AC
  Administered 2013-12-18: 2 g via INTRAVENOUS

## 2013-12-18 MED ORDER — MIDAZOLAM HCL 2 MG/2ML IJ SOLN
INTRAMUSCULAR | Status: AC
  Filled 2013-12-18: qty 2

## 2013-12-18 MED ORDER — ALUM & MAG HYDROXIDE-SIMETH 200-200-20 MG/5ML PO SUSP: 30.0000 mL | ORAL | Status: DC | PRN

## 2013-12-18 MED ORDER — POLYETHYLENE GLYCOL 3350 17 G PO PACK
17.0000 g | PACK | Freq: Two times a day (BID) | ORAL | Status: DC
  Administered 2013-12-18 – 2013-12-19 (×2): 17 g via ORAL
  Filled 2013-12-18 (×3): qty 1

## 2013-12-18 MED ORDER — GLYCOPYRROLATE 0.2 MG/ML IJ SOLN
INTRAMUSCULAR | Status: DC | PRN
  Administered 2013-12-18: .8 mg via INTRAVENOUS

## 2013-12-18 MED ORDER — ALPRAZOLAM 0.25 MG PO TABS: 0.2500 mg | ORAL_TABLET | Freq: Three times a day (TID) | ORAL | Status: DC | PRN

## 2013-12-18 MED ORDER — PANTOPRAZOLE SODIUM 40 MG PO TBEC
40.0000 mg | DELAYED_RELEASE_TABLET | Freq: Every day | ORAL | Status: DC
  Filled 2013-12-18 (×2): qty 1

## 2013-12-18 MED ORDER — ACETAMINOPHEN 10 MG/ML IV SOLN
1000.0000 mg | Freq: Once | INTRAVENOUS | Status: AC
  Administered 2013-12-18: 1000 mg via INTRAVENOUS
  Filled 2013-12-18: qty 100

## 2013-12-18 MED ORDER — LACTATED RINGERS IV SOLN
INTRAVENOUS | Status: DC | PRN
  Administered 2013-12-18 (×3): via INTRAVENOUS

## 2013-12-18 MED ORDER — DIPHENHYDRAMINE HCL 25 MG PO CAPS: 25.0000 mg | ORAL_CAPSULE | Freq: Four times a day (QID) | ORAL | Status: DC | PRN

## 2013-12-18 MED ORDER — GLYCOPYRROLATE 0.2 MG/ML IJ SOLN
INTRAMUSCULAR | Status: AC
  Filled 2013-12-18: qty 3

## 2013-12-18 MED ORDER — ONDANSETRON HCL 4 MG/2ML IJ SOLN
INTRAMUSCULAR | Status: DC | PRN
  Administered 2013-12-18: 4 mg via INTRAVENOUS

## 2013-12-18 MED ORDER — NEOSTIGMINE METHYLSULFATE 1 MG/ML IJ SOLN
INTRAMUSCULAR | Status: AC
Start: 2013-12-18 — End: 2013-12-18
  Filled 2013-12-18: qty 10

## 2013-12-18 MED ORDER — FENTANYL CITRATE 0.05 MG/ML IJ SOLN
INTRAMUSCULAR | Status: DC | PRN
  Administered 2013-12-18: 100 ug via INTRAVENOUS
  Administered 2013-12-18 (×5): 50 ug via INTRAVENOUS

## 2013-12-18 MED ORDER — DEXAMETHASONE SODIUM PHOSPHATE 10 MG/ML IJ SOLN
INTRAMUSCULAR | Status: AC
  Filled 2013-12-18: qty 1

## 2013-12-18 MED ORDER — MIDAZOLAM HCL 5 MG/5ML IJ SOLN
INTRAMUSCULAR | Status: DC | PRN
  Administered 2013-12-18: 2 mg via INTRAVENOUS

## 2013-12-18 MED ORDER — METOCLOPRAMIDE HCL 10 MG PO TABS: 5.0000 mg | ORAL_TABLET | Freq: Three times a day (TID) | ORAL | Status: DC | PRN

## 2013-12-18 MED ORDER — FENTANYL CITRATE 0.05 MG/ML IJ SOLN
INTRAMUSCULAR | Status: AC
  Filled 2013-12-18: qty 2

## 2013-12-18 MED ORDER — DOCUSATE SODIUM 100 MG PO CAPS
100.0000 mg | ORAL_CAPSULE | Freq: Two times a day (BID) | ORAL | Status: DC
  Administered 2013-12-18 – 2013-12-19 (×2): 100 mg via ORAL
  Filled 2013-12-18 (×3): qty 1

## 2013-12-18 MED ORDER — ROCURONIUM BROMIDE 100 MG/10ML IV SOLN
INTRAVENOUS | Status: DC | PRN
  Administered 2013-12-18: 50 mg via INTRAVENOUS
  Administered 2013-12-18: 10 mg via INTRAVENOUS

## 2013-12-18 MED ORDER — PROPOFOL 10 MG/ML IV BOLUS
INTRAVENOUS | Status: DC | PRN
  Administered 2013-12-18: 200 mg via INTRAVENOUS

## 2013-12-18 MED ORDER — CELECOXIB 200 MG PO CAPS
200.0000 mg | ORAL_CAPSULE | Freq: Two times a day (BID) | ORAL | Status: DC
  Administered 2013-12-18 – 2013-12-19 (×2): 200 mg via ORAL
  Filled 2013-12-18 (×3): qty 1

## 2013-12-18 MED ORDER — PHENYLEPHRINE HCL 10 MG/ML IJ SOLN
10.0000 mg | INTRAVENOUS | Status: DC | PRN
  Administered 2013-12-18: 40 ug/min via INTRAVENOUS

## 2013-12-18 MED ORDER — MENTHOL 3 MG MT LOZG: 1.0000 | LOZENGE | OROMUCOSAL | Status: DC | PRN

## 2013-12-18 MED ORDER — 0.9 % SODIUM CHLORIDE (POUR BTL) OPTIME
TOPICAL | Status: DC | PRN
  Administered 2013-12-18: 1000 mL

## 2013-12-18 MED ORDER — METHOCARBAMOL 500 MG PO TABS: 500.0000 mg | ORAL_TABLET | Freq: Four times a day (QID) | ORAL | Status: DC | PRN

## 2013-12-18 MED ORDER — HYDROMORPHONE HCL PF 1 MG/ML IJ SOLN
0.5000 mg | INTRAMUSCULAR | Status: DC | PRN
Start: 2013-12-18 — End: 2013-12-19
  Administered 2013-12-19 (×3): 1 mg via INTRAVENOUS
  Filled 2013-12-18 (×4): qty 1

## 2013-12-18 MED ORDER — ONDANSETRON HCL 4 MG PO TABS: 4.0000 mg | ORAL_TABLET | Freq: Four times a day (QID) | ORAL | Status: DC | PRN

## 2013-12-18 MED ORDER — PHENOL 1.4 % MT LIQD: 1.0000 | OROMUCOSAL | Status: DC | PRN

## 2013-12-18 MED ORDER — LACTATED RINGERS IV SOLN: INTRAVENOUS | Status: DC

## 2013-12-18 MED ORDER — PNEUMOCOCCAL VAC POLYVALENT 25 MCG/0.5ML IJ INJ
0.5000 mL | INJECTION | INTRAMUSCULAR | Status: AC
  Administered 2013-12-19: 0.5 mL via INTRAMUSCULAR
  Filled 2013-12-18 (×2): qty 0.5

## 2013-12-18 MED ORDER — FENTANYL CITRATE 0.05 MG/ML IJ SOLN
INTRAMUSCULAR | Status: AC
  Filled 2013-12-18: qty 5

## 2013-12-18 MED ORDER — NEOSTIGMINE METHYLSULFATE 1 MG/ML IJ SOLN
INTRAMUSCULAR | Status: DC | PRN
  Administered 2013-12-18: 5 mg via INTRAVENOUS

## 2013-12-18 MED ORDER — ROCURONIUM BROMIDE 100 MG/10ML IV SOLN
INTRAVENOUS | Status: AC
  Filled 2013-12-18: qty 1

## 2013-12-18 MED ORDER — FUROSEMIDE 10 MG/ML IJ SOLN
20.0000 mg | Freq: Once | INTRAMUSCULAR | Status: AC
  Administered 2013-12-18: 20 mg via INTRAVENOUS
  Filled 2013-12-18: qty 2

## 2013-12-18 MED ORDER — DEXAMETHASONE SODIUM PHOSPHATE 10 MG/ML IJ SOLN
10.0000 mg | Freq: Once | INTRAMUSCULAR | Status: AC
  Administered 2013-12-18: 10 mg via INTRAVENOUS

## 2013-12-18 MED ORDER — LIDOCAINE HCL (CARDIAC) 20 MG/ML IV SOLN
INTRAVENOUS | Status: AC
  Filled 2013-12-18: qty 5

## 2013-12-18 MED ORDER — FLEET ENEMA 7-19 GM/118ML RE ENEM: 1.0000 | ENEMA | Freq: Once | RECTAL | Status: AC | PRN

## 2013-12-18 MED ORDER — BISACODYL 10 MG RE SUPP: 10.0000 mg | Freq: Every day | RECTAL | Status: DC | PRN

## 2013-12-18 MED ORDER — RIVAROXABAN 20 MG PO TABS: 20.0000 mg | ORAL_TABLET | Freq: Every day | ORAL | Status: DC

## 2013-12-18 MED ORDER — HYDROMORPHONE HCL PF 1 MG/ML IJ SOLN
INTRAMUSCULAR | Status: DC | PRN
  Administered 2013-12-18 (×2): 1 mg via INTRAVENOUS

## 2013-12-18 SURGICAL SUPPLY — 41 items
ADH SKN CLS APL DERMABOND .7 (GAUZE/BANDAGES/DRESSINGS) ×1
BAG SPEC THK2 15X12 ZIP CLS (MISCELLANEOUS)
BAG ZIPLOCK 12X15 (MISCELLANEOUS) IMPLANT
BLADE SAW SGTL 18X1.27X75 (BLADE) ×2 IMPLANT
BLADE SAW SGTL 18X1.27X75MM (BLADE) ×1
CAPT HIP PF COP ×2 IMPLANT
DERMABOND ADVANCED (GAUZE/BANDAGES/DRESSINGS) ×2
DERMABOND ADVANCED .7 DNX12 (GAUZE/BANDAGES/DRESSINGS) ×1 IMPLANT
DRAPE C-ARM 42X120 X-RAY (DRAPES) ×3 IMPLANT
DRAPE STERI IOBAN 125X83 (DRAPES) ×3 IMPLANT
DRAPE U-SHAPE 47X51 STRL (DRAPES) ×9 IMPLANT
DRSG AQUACEL AG ADV 3.5X10 (GAUZE/BANDAGES/DRESSINGS) ×3 IMPLANT
DRSG TEGADERM 4X4.75 (GAUZE/BANDAGES/DRESSINGS) IMPLANT
DURAPREP 26ML APPLICATOR (WOUND CARE) ×3 IMPLANT
ELECT BLADE TIP CTD 4 INCH (ELECTRODE) ×3 IMPLANT
ELECT REM PT RETURN 9FT ADLT (ELECTROSURGICAL) ×3
ELECTRODE REM PT RTRN 9FT ADLT (ELECTROSURGICAL) ×1 IMPLANT
EVACUATOR 1/8 PVC DRAIN (DRAIN) IMPLANT
FACESHIELD LNG OPTICON STERILE (SAFETY) ×12 IMPLANT
GAUZE SPONGE 2X2 8PLY STRL LF (GAUZE/BANDAGES/DRESSINGS) IMPLANT
GLOVE BIOGEL PI IND STRL 7.5 (GLOVE) ×1 IMPLANT
GLOVE BIOGEL PI IND STRL 8 (GLOVE) ×1 IMPLANT
GLOVE BIOGEL PI INDICATOR 7.5 (GLOVE) ×2
GLOVE BIOGEL PI INDICATOR 8 (GLOVE) ×2
GLOVE ECLIPSE 8.0 STRL XLNG CF (GLOVE) ×3 IMPLANT
GLOVE ORTHO TXT STRL SZ7.5 (GLOVE) ×6 IMPLANT
GOWN SPEC L3 XXLG W/TWL (GOWN DISPOSABLE) ×3 IMPLANT
GOWN STRL REUS W/TWL LRG LVL3 (GOWN DISPOSABLE) ×3 IMPLANT
KIT BASIN OR (CUSTOM PROCEDURE TRAY) ×3 IMPLANT
PACK TOTAL JOINT (CUSTOM PROCEDURE TRAY) ×3 IMPLANT
PADDING CAST COTTON 6X4 STRL (CAST SUPPLIES) ×3 IMPLANT
SPONGE GAUZE 2X2 STER 10/PKG (GAUZE/BANDAGES/DRESSINGS)
SUT MNCRL AB 4-0 PS2 18 (SUTURE) ×3 IMPLANT
SUT VIC AB 1 CT1 36 (SUTURE) ×9 IMPLANT
SUT VIC AB 2-0 CT1 27 (SUTURE) ×6
SUT VIC AB 2-0 CT1 TAPERPNT 27 (SUTURE) ×2 IMPLANT
SUT VLOC 180 0 24IN GS25 (SUTURE) ×3 IMPLANT
TOWEL OR 17X26 10 PK STRL BLUE (TOWEL DISPOSABLE) ×3 IMPLANT
TOWEL OR NON WOVEN STRL DISP B (DISPOSABLE) IMPLANT
TRAY FOLEY CATH 14FRSI W/METER (CATHETERS) ×1 IMPLANT
TRAY FOLEY CATH 16FRSI W/METER (SET/KITS/TRAYS/PACK) ×2 IMPLANT

## 2013-12-18 NOTE — Interval H&P Note (Signed)
History and Physical Interval Note:  12/18/2013 10:16 AM  Charles Le  has presented today for surgery, with the diagnosis of osteoarthritis of the right hip  The various methods of treatment have been discussed with the patient and family. After consideration of risks, benefits and other options for treatment, the patient has consented to  Procedure(s): RIGHT TOTAL HIP ARTHROPLASTY ANTERIOR APPROACH (Right) as a surgical intervention .  The patient's history has been reviewed, patient examined, no change in status, stable for surgery.  I have reviewed the patient's chart and labs.  Questions were answered to the patient's satisfaction.     Shelda Pal

## 2013-12-18 NOTE — Preoperative (Signed)
Beta Blockers   Reason not to administer Beta Blockers:Not Applicable 

## 2013-12-18 NOTE — Op Note (Signed)
NAME:  Charles Le                ACCOUNT NO.: [id number]      MEDICAL RECORD NO.: [id number]      FACILITY:  [facility]      PHYSICIAN:  Kashus Karlen D  DATE OF BIRTH:  09/14/1956     DATE OF PROCEDURE:  12/18/2013                                 OPERATIVE REPORT         PREOPERATIVE DIAGNOSIS: Right  hip osteoarthritis.      POSTOPERATIVE DIAGNOSIS:  Right hip osteoarthritis.      PROCEDURE:  Right total hip replacement through an anterior approach   utilizing DePuy THR system, component sizEndocentre Of Marland Ki[id number]4Mark Fromer [facility] DbMarland KitcheKentuckyna [id number] E. SLadona RidRCarilioKindred Hospital - La MiradanOphthalmology CenterMarlCentura Health-St Mary CorBountiful SurgeMarland KitcheKentuckynry 086Summe36moKettering YoutMarland KitcheKentuckyHuntsville Hospital, ThenThe OrMarland KitKell West Regional HosMarland KitcheKentuckynpiSurgery Center Of CanfiMarland Ki[id number] Marland KitcheKLexington Medical Center IrmoeSpoMarland KitcheKentuckyntsySan Jorge ChildrenMarland KitcheKentuckyHighsmith-Rainey Memorial HospitalnMarland KitcheKentuckNorth Dakota SuMarland KitcheKentCentra Lynchburg General HospiMarland [id number] Surgery CenMarland KitcheKentuckynter086Middl4Mount Auburn HospitMarland [id number] SummerhousTulsa Er & HospitalLNeoshoMarlandChildrens Healthcare Of Atlanta - Egleston Marland KiHampton VSt Joseph Mercy OaklanMNortheast Rehabilitation HospitalaCaroMarland KitcheKentuckynlin08[facility]ve [facility]6The Unity HospMarland KitcheKentuckynitaThe Heart Hospital At Deaconess GaMarland [id number] Eye 35 Asc LLCVa MeMarland KitcheStrategic Behavioral Center LelandKOsu Dozal CancerMilbank Area Hospital / Avera HeMarland [facility] Medical CeMarland KitcheKentuckynnte086TreBaptist Health RichmondmEndoscopic DiMarland KitcheKentuckyn11St Francis Regional Med CentermRegional Medical CenterAdvanced Surgery Medical CMarSurgisite BostTallahassee Memorial HosMarland [id number] PrLadona RidRBaylor SurgicareEncompass Health Rehabilitation Hospital Of Altamonte Springs Va Marland [id number] Mclaren CenOch Regional MeMarland KRegions Behavioral HospitaliUc Regents Dba Ucla HeaMarland KitcheKentAllen County HospitaluMontefiore MarlanSurgicare Of Wichita LLCdSapling GrMarland KitcheKentuckynHosp San Carlos BorromeooMain StMarland KitcheKentuckChi St Joseph Health Madison HospiMarland KitcheKentuckyntal086South RCorwin LevinsRongdical CenterlendaLadona RidRHalifax Psychiatric Center-NortDell Children eramic   ball.      SURGEON:  Jurni Cesaro D. Geraldene Eisel, M.D.      ASSISTANT:  Chaniqua Brisby Babish, PA-C      ANESTHESIA:  General.      SPECIMENS:  None.      COMPLICATIONS:  None.      BLOOD LOSS:  1000 cc     DRAINS:  None.      INDICATION OF THE PROCEDURE:  Brittney M Aubry is a 58 y.o. male who had   presented to office for evaluation of right hip pain.  Radiographs revealed   progressive degenerative changes with bone-on-bone   articulation to the  hip joint.  The patient had painful limited range of   motion significantly affecting their overall quality of life.  The patient was failing to    respond to conservative measures, and at this point was ready   to proceed with more definitive measures.  The patient has noted progressive   degenerative changes in his hip, progressive problems and dysfunction   with regarding the hip prior to surgery.  Consent was obtained for   benefit of pain relief.  Specific risk of infection, DVT, component   failure, dislocation, need for revision surgery, as well discussion of   the anterior versus posterior approach were reviewed.  Consent was   obtained for benefit of anterior pain relief through an anterior   approach.      PROCEDURE IN DETAIL:  The patient was brought to operative theater.   Once adequate anesthesia,  preoperative antibiotics, 2gm Ancef administered.   The patient was positioned supine on the OSI Hanna table.  Once adequate   padding of boney process was carried out, we had predraped out the hip, and  used fluoroscopy to confirm orientation of the pelvis and position.      The right hip was then prepped and draped from proximal iliac crest to   mid thigh with shower curtain technique.      Time-out was performed identifying the patient, planned procedure, and   extremity.     An incision was then made 2 cm distal and lateral to the   anterior superior iliac spine extending over the orientation of the   tensor fascia lata muscle and sharp dissection was carried down to the   fascia of the muscle and protractor placed in the soft tissues.      The fascia was then incised.  The muscle belly was identified and swept  laterally and retractor placed along the superior neck.  Following   cauterization of the circumflex vessels and removing some pericapsular   fat, a second cobra retractor was placed on the inferior neck.  A third   retractor was placed on the anterior acetabulum after elevating the   anterior rectus.  A L-capsulotomy was along the line of the   superior neck to the trochanteric fossa, then extended proximally and   distally.  Tag sutures were placed and the retractors were then placed   intracapsular.  We then identified the trochanteric fossa and   orientation of my neck cut, confirmed this radiographically   and then made a neck osteotomy with the femur on traction.  The femoral   head was removed without difficulty or complication.  Traction was let   off and retractors were placed posterior and anterior around the   acetabulum.      The labrum and foveal tissue were debrided.  I began reaming with a 47mm   reamer and reamed up to 53mm reamer with good bony bed preparation and a 54mm   cup was chosen.  The final 54mm Pinnacle cup was then impacted under fluoroscopy   to confirm the depth of penetration and orientation with respect to   abduction.  A screw was placed followed by the hole eliminator.  The final   36+4 neutral Altrex liner was impacted with good visualized rim fit.  The cup was positioned anatomically within the acetabular portion of the pelvis.      At this point, the femur was rolled at 80 degrees.  Further capsule was   released off the inferior aspect of the femoral neck.  I then   released the superior capsule proximally.  The hook was placed laterally   along the femur and elevated manually and held in position with the bed   hook.  The leg was then extended and adducted with the leg rolled to 100   degrees of external rotation.  Once the proximal femur was fully   exposed, I used a box osteotome to set orientation.  I then began   broaching with the starting chili pepper broach and passed this by hand and then broached up to 6.  With the 6 broach in place I chose a high offset neck and did a trial reduction.  The offset was appropriate, leg lengths   appeared to be equal, confirmed radiographically.   Given these findings, I went ahead and dislocated the hip, repositioned all   retractors and positioned the right hip in the extended and abducted position.  The final 6 Hi Tri Lock stem was   chosen and it was impacted down to the level of neck cut.  Based on this   and the trial reduction, a 36+1.5 delta ceramic ball was chosen and   impacted onto a clean and dry trunnion, and the hip was reduced.  The   hip had been irrigated throughout the case again at this point.  I did   reapproximate the superior capsular leaflet to the anterior leaflet   using #1 Vicryl.  The fascia of the   tensor fascia lata muscle was then reapproximated using #1 Vicryl and #0 V-lock.  The   remaining wound was closed with 2-0 Vicryl and running 4-0 Monocryl.   The hip was cleaned, dried, and dressed sterilely using Dermabond and   Aquacel dressing.  She was  then brought   to recovery room in  stable condition tolerating the procedure well.    Lanney Gins, PA-C was present for the entirety of the case involved from   preoperative positioning, perioperative retractor management, general   facilitation of the case, as well as primary wound closure as assistant.            Madlyn Frankel Charlann Boxer, M.D.        12/18/2013 1:43 PM

## 2013-12-18 NOTE — Transfer of Care (Signed)
Immediate Anesthesia Transfer of Care Note  Patient: Charles Le  Procedure(s) Performed: Procedure(s) (LRB): RIGHT TOTAL HIP ARTHROPLASTY ANTERIOR APPROACH (Right)  Patient Location: PACU  Anesthesia Type: General  Level of Consciousness: sedated, patient cooperative and responds to stimulation  Airway & Oxygen Therapy: Patient Spontanous Breathing and Patient connected to face mask oxgen  Post-op Assessment: Report given to PACU RN and Post -op Vital signs reviewed and stable  Post vital signs: Reviewed and stable  Complications: No apparent anesthesia complications

## 2013-12-18 NOTE — Anesthesia Preprocedure Evaluation (Addendum)
Anesthesia Evaluation  Patient identified by MRN, date of birth, ID band Patient awake    Reviewed: Allergy & Precautions, H&P , NPO status , Patient's Chart, lab work & pertinent test results  History of Anesthesia Complications (+) PONV  Airway Mallampati: III TM Distance: >3 FB Neck ROM: full    Dental no notable dental hx. (+) Caps, Dental Advisory Given Cap left upper front lateral incissor:   Pulmonary neg pulmonary ROS, Current Smoker,  breath sounds clear to auscultation  Pulmonary exam normal       Cardiovascular hypertension, Pt. on medications + dysrhythmias Atrial Fibrillation + pacemaker + Cardiac Defibrillator Rhythm:regular Rate:Normal  Hypertrophic obstructive cardiomyopathy   Neuro/Psych negative neurological ROS  negative psych ROS   GI/Hepatic negative GI ROS, Neg liver ROS,   Endo/Other  negative endocrine ROS  Renal/GU negative Renal ROS  negative genitourinary   Musculoskeletal   Abdominal   Peds  Hematology negative hematology ROS (+)   Anesthesia Other Findings   Reproductive/Obstetrics negative OB ROS                          Anesthesia Physical Anesthesia Plan  ASA: III  Anesthesia Plan: General   Post-op Pain Management:    Induction: Intravenous  Airway Management Planned: Oral ETT  Additional Equipment:   Intra-op Plan:   Post-operative Plan: Extubation in OR  Informed Consent: I have reviewed the patients History and Physical, chart, labs and discussed the procedure including the risks, benefits and alternatives for the proposed anesthesia with the patient or authorized representative who has indicated his/her understanding and acceptance.   Dental Advisory Given  Plan Discussed with: CRNA and Surgeon  Anesthesia Plan Comments:         Anesthesia Quick Evaluation

## 2013-12-18 NOTE — Anesthesia Postprocedure Evaluation (Signed)
  Anesthesia Post-op Note  Patient: Charles Le  Procedure(s) Performed: Procedure(s) (LRB): RIGHT TOTAL HIP ARTHROPLASTY ANTERIOR APPROACH (Right)  Patient Location: PACU  Anesthesia Type: General  Level of Consciousness: awake and alert   Airway and Oxygen Therapy: Patient Spontanous Breathing  Post-op Pain: mild  Post-op Assessment: Post-op Vital signs reviewed, Patient's Cardiovascular Status Stable, Respiratory Function Stable, Patent Airway and No signs of Nausea or vomiting  Last Vitals:  Filed Vitals:   12/18/13 1415  BP:   Pulse: 76  Temp: 36.7 C  Resp: 12    Post-op Vital Signs: stable   Complications: No apparent anesthesia complications

## 2013-12-19 ENCOUNTER — Encounter (HOSPITAL_COMMUNITY): Payer: Self-pay | Admitting: Orthopedic Surgery

## 2013-12-19 LAB — CBC
HCT: 34.9 % — ABNORMAL LOW (ref 39.0–52.0)
Hemoglobin: 12 g/dL — ABNORMAL LOW (ref 13.0–17.0)
MCH: 32.6 pg (ref 26.0–34.0)
MCHC: 34.4 g/dL (ref 30.0–36.0)
MCV: 94.8 fL (ref 78.0–100.0)
Platelets: 191 10*3/uL (ref 150–400)
RBC: 3.68 MIL/uL — ABNORMAL LOW (ref 4.22–5.81)
RDW: 12.3 % (ref 11.5–15.5)
WBC: 18.4 10*3/uL — ABNORMAL HIGH (ref 4.0–10.5)

## 2013-12-19 LAB — BASIC METABOLIC PANEL
BUN: 20 mg/dL (ref 6–23)
CO2: 23 mEq/L (ref 19–32)
CREATININE: 1.29 mg/dL (ref 0.50–1.35)
Calcium: 8.5 mg/dL (ref 8.4–10.5)
Chloride: 95 mEq/L — ABNORMAL LOW (ref 96–112)
GFR calc Af Amer: 71 mL/min — ABNORMAL LOW (ref >90)
GFR, EST NON AFRICAN AMERICAN: 61 mL/min — AB (ref >90)
Glucose, Bld: 136 mg/dL — ABNORMAL HIGH (ref 70–99)
POTASSIUM: 4.5 meq/L (ref 3.7–5.3)
Sodium: 130 mEq/L — ABNORMAL LOW (ref 137–147)

## 2013-12-19 MED ORDER — DSS 100 MG PO CAPS: 100.0000 mg | ORAL_CAPSULE | Freq: Two times a day (BID) | ORAL | Status: DC

## 2013-12-19 MED ORDER — METHOCARBAMOL 500 MG PO TABS: 500.0000 mg | ORAL_TABLET | Freq: Four times a day (QID) | ORAL | Status: DC | PRN

## 2013-12-19 MED ORDER — HYDROCODONE-ACETAMINOPHEN 7.5-325 MG PO TABS: 1.0000 | ORAL_TABLET | ORAL | Status: DC

## 2013-12-19 MED ORDER — POLYETHYLENE GLYCOL 3350 17 G PO PACK: 17.0000 g | PACK | Freq: Two times a day (BID) | ORAL | Status: DC

## 2013-12-19 MED ORDER — FERROUS SULFATE 325 (65 FE) MG PO TABS: 325.0000 mg | ORAL_TABLET | Freq: Three times a day (TID) | ORAL | Status: DC

## 2013-12-19 NOTE — Evaluation (Signed)
Physical Therapy Evaluation Patient Details Name: FUE STIPES MRN: [id number] DOB: 08-29-1956 Today's Date: 12/19/2013 Time: 0925-1005 PT Time Calculation (min): 40 min  PT Assessment / Plan / Recommendation History of Present Illness  58 yo male s/p R THA-DA 12/18/13  Clinical Impression  On eval, pt required Min assist for mobility-able to ambulate ~100 feet with walker. Limited by lightheadedness-likely due to 1st time up and IV pain meds just before session. Recommend HHPT, RW. Will plan to see a 2nd time today. Possible d/c home later depending on performance.     PT Assessment  Patient needs continued PT services    Follow Up Recommendations  Home health PT    Does the patient have the potential to tolerate intense rehabilitation      Barriers to Discharge        Equipment Recommendations  Rolling walker with 5" wheels    Recommendations for Other Services     Frequency 7X/week    Precautions / Restrictions Precautions Precautions: Fall Restrictions Weight Bearing Restrictions: No RLE Weight Bearing: Weight bearing as tolerated   Pertinent Vitals/Pain 5/10 R hip. Ice applied end of session      Mobility  Bed Mobility Overal bed mobility: Needs Assistance Bed Mobility: Supine to Sit Supine to sit: Min assist General bed mobility comments: Assist for trunk to full upright. Increased time.  Transfers Overall transfer level: Needs assistance Equipment used: Rolling walker (2 wheeled) Transfers: Sit to/from Stand Sit to Stand: Supervision;From elevated surface Stand pivot transfers: Supervision;From elevated surface General transfer comment: verbal cues for hand placement and safety Ambulation/Gait Ambulation/Gait assistance: Min guard Ambulation Distance (Feet): 100 Feet Assistive device: Rolling walker (2 wheeled) General Gait Details: slow gait speed. Pt c/o mild-moderate lightheadedness-likely due to IV pain meds just before mobilizing. Followed closely  with wheelchair.     Exercises     PT Diagnosis: Difficulty walking;Abnormality of gait;Acute pain  PT Problem List: Decreased range of motion;Decreased activity tolerance;Decreased balance;Decreased mobility;Pain;Decreased knowledge of use of DME PT Treatment Interventions: DME instruction;Gait training;Stair training;Functional mobility training;Therapeutic activities;Therapeutic exercise;Patient/family education;Balance training     PT Goals(Current goals can be found in the care plan section) Acute Rehab PT Goals Patient Stated Goal: home today PT Goal Formulation: With patient/family Time For Goal Achievement: 12/26/13 Potential to Achieve Goals: Good  Visit Information  Last PT Received On: 12/19/13 Assistance Needed: +1 History of Present Illness: 58 yo male s/p R THA-DA 12/18/13       Prior Functioning  Home Living Family/patient expects to be discharged to:: Private residence Living Arrangements: Spouse/significant other Available Help at Discharge: Family Type of Home: House Home Access: Level entry Home Layout: Two level;Able to live on main level with bedroom/bathroom;1/2 bath on main level Home Equipment: None Prior Function Level of Independence: Independent Communication Communication: No difficulties    Cognition  Cognition Arousal/Alertness: Awake/alert Behavior During Therapy: WFL for tasks assessed/performed Overall Cognitive Status: Within Functional Limits for tasks assessed    Extremity/Trunk Assessment Upper Extremity Assessment Upper Extremity Assessment: Defer to OT evaluation Lower Extremity Assessment Lower Extremity Assessment: RLE deficits/detail RLE Deficits / Details: hip flex 2/5, hip abd/add 2/5, moves ankle well. Pt did c/o some numbness.  Cervical / Trunk Assessment Cervical / Trunk Assessment: Normal   Balance    End of Session PT - End of Session Equipment Utilized During Treatment: Gait belt Activity Tolerance: Patient  tolerated treatment well Patient left: in bed;with call bell/phone within reach  GP  Weston Anna, MPT Pager: [phone]

## 2013-12-19 NOTE — Progress Notes (Signed)
Patient ID: Charles Le, male   DOB: 1956-07-06, 58 y.o.   MRN: [id number] Subjective: 1 Day Post-Op Procedure(s) (LRB): RIGHT TOTAL HIP ARTHROPLASTY ANTERIOR APPROACH (Right)    Patient reports pain as moderate.  Up most of the night but no problems, ready to get home.  Feels some burning pain at incision site  Objective:   VITALS:   Filed Vitals:   12/19/13 0641  BP: 104/67  Pulse: 74  Temp: 98.1 F (36.7 C)  Resp: 20    Neurovascular intact Incision: dressing C/D/I  LABS  Recent Labs  12/19/13 0413  HGB 12.0*  HCT 34.9*  WBC 18.4*  PLT 191     Recent Labs  12/19/13 0413  NA 130*  K 4.5  BUN 20  CREATININE 1.29  GLUCOSE 136*     Recent Labs  12/18/13 0940  INR 0.94     Assessment/Plan: 1 Day Post-Op Procedure(s) (LRB): RIGHT TOTAL HIP ARTHROPLASTY ANTERIOR APPROACH (Right)   Advance diet Up with therapy Discharge home with home health today after therapy Rx on chart RTC 2 weeks

## 2013-12-19 NOTE — Evaluation (Signed)
Occupational Therapy Evaluation Patient Details Name: Charles Le MRN: [id number] DOB: Jul 03, 1956 Today's Date: 12/19/2013 Time: 0973-5329 OT Time Calculation (min): 40 min  OT Assessment / Plan / Recommendation History of present illness 58 yo male s/p R THA-DA 12/18/13   Clinical Impression   Pt presents to OT s/p THA. All education complete. No further OT indicated at this time.    OT Assessment  Patient does not need any further OT services    Follow Up Recommendations  No OT follow up       Equipment Recommendations  None recommended by OT          Precautions / Restrictions Precautions Precautions: Fall Restrictions Weight Bearing Restrictions: No RLE Weight Bearing: Weight bearing as tolerated       ADL  Lower Body Dressing: Moderate assistance Where Assessed - Lower Body Dressing: Unsupported sit to stand Toilet Transfer: Supervision/safety Toilet Transfer Method: Sit to stand Toileting - Clothing Manipulation and Hygiene: Supervision/safety Where Assessed - Engineer, mining and Hygiene: Standing Equipment Used: Sock aid;Reacher ADL Comments: Pt and wife educated in use of AE.  Pts wife will obtain. Pt has had trouble putting on socks for 5 years.        OT Goals(Current goals can be found in the care plan section) Acute Rehab OT Goals Patient Stated Goal: home today  Visit Information  Last OT Received On: 12/19/13 Assistance Needed: +1 History of Present Illness: 58 yo male s/p R THA-DA 12/18/13       Prior Functioning     Home Living Family/patient expects to be discharged to:: Private residence Living Arrangements: Spouse/significant other Available Help at Discharge: Family Type of Home: House Home Access: Level entry Home Layout: Two level;Able to live on main level with bedroom/bathroom;1/2 bath on main level Home Equipment: None Prior Function Level of Independence: Independent Communication Communication: No  difficulties         Vision/Perception Vision - History Patient Visual Report: No change from baseline   Cognition  Cognition Arousal/Alertness: Awake/alert Behavior During Therapy: WFL for tasks assessed/performed Overall Cognitive Status: Within Functional Limits for tasks assessed    Extremity/Trunk Assessment Upper Extremity Assessment Upper Extremity Assessment: Defer to OT evaluation Lower Extremity Assessment Lower Extremity Assessment: RLE deficits/detail RLE Deficits / Details: hip flex 2/5, hip abd/add 2/5, moves ankle well. Pt did c/o some numbness.  Cervical / Trunk Assessment Cervical / Trunk Assessment: Normal     Mobility Bed Mobility Overal bed mobility: Needs Assistance Bed Mobility: Supine to Sit Supine to sit: Min assist General bed mobility comments: Assist for trunk to full upright. Increased time.  Transfers Overall transfer level: Needs assistance Equipment used: Rolling walker (2 wheeled) Transfers: Sit to/from Stand Sit to Stand: Supervision;From elevated surface Stand pivot transfers: Supervision;From elevated surface General transfer comment: verbal cues for hand placement and safety           End of Session OT - End of Session Activity Tolerance: Patient tolerated treatment well Patient left: in bed;with family/visitor present Nurse Communication: Mobility status  GO     Charles Le 12/19/2013, 11:36 AM

## 2013-12-19 NOTE — Progress Notes (Signed)
Physical Therapy Treatment Patient Details Name: Charles Le MRN: [id number] DOB: 12-08-1955 Today's Date: 12/19/2013 Time: 3016-0109 PT Time Calculation (min): 26 min  PT Assessment / Plan / Recommendation  History of Present Illness 58 yo male s/p R THA-DA 12/18/13   PT Comments   Progressing well with mobility. Pt and wife ready to d/c home. Practiced ambulation and exercises. Pt does not have steps to enter home but he does have steps to get to bedroom (he will be sleeping on 1st level initially). Pt declined to practice. Discussed HHPT and pt agreeable to at least one session-made case manager aware (initially pt was declining HHPT). Ready to d/c from PT standpoint.   Follow Up Recommendations  Home health PT     Does the patient have the potential to tolerate intense rehabilitation     Barriers to Discharge        Equipment Recommendations  Rolling walker with 5" wheels    Recommendations for Other Services    Frequency 7X/week   Progress towards PT Goals Progress towards PT goals: Progressing toward goals  Plan Current plan remains appropriate    Precautions / Restrictions Precautions Precautions: Fall Restrictions Weight Bearing Restrictions: No RLE Weight Bearing: Weight bearing as tolerated   Pertinent Vitals/Pain 5/10 R hip with activity.     Mobility  Bed Mobility Overal bed mobility: Needs Assistance Bed Mobility: Supine to Sit;Sit to Supine Supine to sit: Min guard Sit to supine: Min assist General bed mobility comments: assist for R LE onto bed.  Transfers Overall transfer level: Needs assistance Equipment used: Rolling walker (2 wheeled) Transfers: Sit to/from Stand Sit to Stand: Supervision Stand pivot transfers: Supervision General transfer comment: verbal cues for hand placement and safety Ambulation/Gait Ambulation/Gait assistance: Min guard Ambulation Distance (Feet): 250 Feet Assistive device: Rolling walker (2 wheeled) General Gait  Details: slow gait speed. VCs safety. Mild lightheadedness still but pt able to participate well with ambulation Stairs:  (offered to practice stair negotiation but pt declined. )    Exercises Total Joint Exercises Ankle Circles/Pumps: AROM;Both;15 reps;Supine Quad Sets: AROM;Both;15 reps;Supine Heel Slides: AAROM;Right;15 reps;Supine Hip ABduction/ADduction: AAROM;Right;15 reps;Supine   PT Diagnosis: Difficulty walking;Abnormality of gait;Acute pain  PT Problem List: Decreased range of motion;Decreased activity tolerance;Decreased balance;Decreased mobility;Pain;Decreased knowledge of use of DME PT Treatment Interventions: DME instruction;Gait training;Stair training;Functional mobility training;Therapeutic activities;Therapeutic exercise;Patient/family education;Balance training   PT Goals (current goals can now be found in the care plan section) Acute Rehab PT Goals Patient Stated Goal: home today PT Goal Formulation: With patient/family Time For Goal Achievement: 12/26/13 Potential to Achieve Goals: Good  Visit Information  Last PT Received On: 12/19/13 Assistance Needed: +1 History of Present Illness: 58 yo male s/p R THA-DA 12/18/13    Subjective Data  Patient Stated Goal: home today   Cognition  Cognition Arousal/Alertness: Awake/alert Behavior During Therapy: WFL for tasks assessed/performed Overall Cognitive Status: Within Functional Limits for tasks assessed    Balance     End of Session PT - End of Session Equipment Utilized During Treatment: Gait belt Activity Tolerance: Patient tolerated treatment well Patient left: in bed;with call bell/phone within reach;with family/visitor present   GP     Rebeca Alert, MPT Pager: [phone]

## 2013-12-19 NOTE — Progress Notes (Signed)
[facility]  [facility] is providing the following services: RW (per CM - patient only needs rw)  If patient discharges after hours, please call [phone].   Charles Le 12/19/2013, 11:47 AM

## 2013-12-19 NOTE — Progress Notes (Signed)
Pt changed his mind concerning home health. Wife Misty Stanley cell number, [phone]). Pt and wife selected [facility] for HHPT. Referral given to in house rep with [facility].

## 2013-12-19 NOTE — Discharge Instructions (Signed)
Information on my medicine - XARELTO (Rivaroxaban)  This medication education was reviewed with me or my healthcare representative as part of my discharge preparation.  The pharmacist that spoke with me during my hospital stay was:  Dannielle Huh, [facility]  Why was Xarelto prescribed for you? Xarelto was prescribed for you to reduce the risk of a blood clot forming that can cause a stroke if you have a medical condition called atrial fibrillation (a type of irregular heartbeat). It is also used to prevent blood clots following your orthopedic surgery.    What do you need to know about xarelto ? Take your Xarelto ONCE DAILY at the same time every day with your evening meal. If you have difficulty swallowing the tablet whole, you may crush it and mix in applesauce just prior to taking your dose.  Take Xarelto exactly as prescribed by your doctor and DO NOT stop taking Xarelto without talking to the doctor who prescribed the medication.  Stopping without other stroke prevention medication to take the place of Xarelto may increase your risk of developing a clot that causes a stroke.  Refill your prescription before you run out.  After discharge, you should have regular check-up appointments with your healthcare provider that is prescribing your Xarelto.  In the future your dose may need to be changed if your kidney function or weight changes by a significant amount.  What do you do if you miss a dose? If you are taking Xarelto ONCE DAILY and you miss a dose, take it as soon as you remember on the same day then continue your regularly scheduled once daily regimen the next day. Do not take two doses of Xarelto at the same time or on the same day.   Important Safety Information A possible side effect of Xarelto is bleeding. You should call your healthcare provider right away if you experience any of the following:   Bleeding from an injury or your nose that does not stop.   Unusual  colored urine (red or dark brown) or unusual colored stools (red or black).   Unusual bruising for unknown reasons.   A serious fall or if you hit your head (even if there is no bleeding).  Some medicines may interact with Xarelto and might increase your risk of bleeding while on Xarelto. To help avoid this, consult your healthcare provider or pharmacist prior to using any new prescription or non-prescription medications, including herbals, vitamins, non-steroidal anti-inflammatory drugs (NSAIDs) and supplements.  This website has more information on Xarelto: VisitDestination.com.br.

## 2013-12-19 NOTE — Progress Notes (Signed)
Pt selected not to have HHPT right now, however he do want RW. Referral given to in house rep with [facility], Pt and wife at bedside is ok with [facility].

## 2013-12-19 NOTE — Progress Notes (Signed)
Discharge instructions and prescriptions given and explained to patient and his wife. Patient had no further questions. Will follow up with Dr. Charlann Boxer on March 4th. IV and telemetry removed and patient escorted downstairs in wheelchair by NT.

## 2013-12-19 NOTE — Progress Notes (Signed)
Patient had two runs of V-tach/ ectopic beats over night.  No complaints of chest pain or SOB at time of irregular beats.  Will continue to monitor patient.

## 2013-12-24 NOTE — Discharge Summary (Signed)
Physician Discharge Summary  Patient ID: Charles Le MRN: [id number] DOB/AGE: [birth year] 56 y.o.  Admit date: 12/18/2013 Discharge date: 12/19/2013   Procedures:  Procedure(s) (LRB): RIGHT TOTAL HIP ARTHROPLASTY ANTERIOR APPROACH (Right)  Attending Physician:  Dr. Durene Romans   Admission Diagnoses:   Right hip OA / pain  Discharge Diagnoses:  Principal Problem:   S/P right THA, AA Active Problems:   History of total replacement of right hip  Past Medical History  Diagnosis Date  . Hypertrophic obstructive cardiomyopathy     hypertrophic  . Aflutter     atypical-left sided  . DDD-ICD     [facility] [address] [phone] ) dual chamber ICD implanted May 24, 2006)  . Atrioventricular block, complete     complete  . Hyperlipidemia   . Heart murmur   . Insomnia   . Anxiety   . Automatic implantable cardioverter-defibrillator in situ     GENERATOR CHANGE 2014  . GERD (gastroesophageal reflux disease)   . H/O hiatal hernia   . Arthritis     OA AND PAIN RT HIP;  S/P LEFT TOTAL HIP REPLACEMENT  . PONV (postoperative nausea and vomiting)     A LITTLE NAUSEA AFTER PREVIOUS HIP REPLACEMENT    HPI: Charles Le, 58 y.o. male, has a history of pain and functional disability in the right hip(s) due to arthritis and patient has failed non-surgical conservative treatments for greater than 12 weeks to include NSAID's and/or analgesics and activity modification. Onset of symptoms was gradual starting 5 years ago with rapidlly worsening over the last 9 months.The patient noted prior procedures of the hip to include arthroplasty on the right hip in December 2010. Patient currently rates pain in the right hip at 8 out of 10 with activity. Patient has worsening of pain with activity and weight bearing, trendelenberg gait, pain that interfers with activities of daily living and pain with passive range of motion. Patient has evidence of periarticular osteophytes and joint space narrowing  by imaging studies. This condition presents safety issues increasing the risk of falls. There is no current active infection. Risks, benefits and expectations were discussed with the patient. Risks including but not limited to the risk of anesthesia, blood clots, nerve damage, blood vessel damage, failure of the prosthesis, infection and up to and including death. Patient understand the risks, benefits and expectations and wishes to proceed with surgery.  PCP: Aura Dials, MD   Discharged Condition: good  Hospital Course:  Patient underwent the above stated procedure on 12/18/2013. Patient tolerated the procedure well and brought to the recovery room in good condition and subsequently to the floor.  POD #1 BP: 104/67 ; Pulse: 74 ; Temp: 98.1 F (36.7 C) ; Resp: 20  Patient reports pain as moderate. Up most of the night but no problems, ready to get home. Feels some burning pain at incision site.   Neurovascular intact, dorsiflexion/plantar flexion intact, incision: dressing C/D/I, no cellulitis present and compartment soft.   LABS  Basename    HGB  12.0  HCT  34.9    Discharge Exam: General appearance: alert, cooperative and no distress Extremities: Homans sign is negative, no sign of DVT, no edema, redness or tenderness in the calves or thighs and no ulcers, gangrene or trophic changes  Disposition:     Home with follow up in 2 weeks   Follow-up Information   Follow up with Shelda Pal, MD. Schedule an appointment as soon as possible for  a visit in 2 weeks.   Specialty:  Orthopedic Surgery   Contact information:   [address] [place] [address] [phone]       Discharge Orders   Future Appointments Provider Department Dept Phone   01/10/2014 9:45 AM [facility] Office [phone]   Future Orders Complete By Expires   Call MD / Call 911  As directed    Comments:     If you experience chest pain or  shortness of breath, CALL 911 and be transported to the hospital emergency room.  If you develope a fever above 101 F, pus (white drainage) or increased drainage or redness at the wound, or calf pain, call your surgeon's office.   Change dressing  As directed    Comments:     Maintain surgical dressing for 10-14 days, or until follow up in the clinic.   Constipation Prevention  As directed    Comments:     Drink plenty of fluids.  Prune juice may be helpful.  You may use a stool softener, such as Colace (over the counter) 100 mg twice a day.  Use MiraLax (over the counter) for constipation as needed.   Diet - low sodium heart healthy  As directed    Discharge instructions  As directed    Comments:     Maintain surgical dressing for 10-14 days, or until follow up in the clinic. Follow [facility] in 2 [facility] at [facility]. Call with any questions or concerns.   Increase activity slowly as tolerated  As directed    TED hose  As directed    Comments:     Use stockings (TED hose) for 2 weeks on both leg(s).  You may remove them at night for sleeping.   Weight bearing as tolerated  As directed    Questions:     Laterality:     Extremity:          Medication List    STOP taking these medications       ibuprofen 200 MG tablet  Commonly known as:  ADVIL,MOTRIN      TAKE these medications       ALPRAZolam 0.25 MG tablet  Commonly known as:  XANAX  Take 0.25 mg by mouth 3 (three) times daily as needed for anxiety.     atorvastatin 20 MG tablet  Commonly known as:  LIPITOR  Take 20 mg by mouth every evening.     DEXILANT 60 MG capsule  Generic drug:  dexlansoprazole  Take 60 mg by mouth every evening.     DSS 100 MG Caps  Take 100 mg by mouth 2 (two) times daily.     ferrous sulfate 325 (65 FE) MG tablet  Take 1 tablet (325 mg total) by mouth 3 (three) times daily after meals.     HYDROcodone-acetaminophen 7.5-325 MG per tablet  Commonly known as:  NORCO  Take 1-2 tablets  by mouth every 4 (four) hours.     lisinopril-hydrochlorothiazide 20-12.5 MG per tablet  Commonly known as:  PRINZIDE,ZESTORETIC  Take 1 tablet by mouth every evening.     methocarbamol 500 MG tablet  Commonly known as:  ROBAXIN  Take 1 tablet (500 mg total) by mouth every 6 (six) hours as needed for muscle spasms.     multivitamin with minerals Tabs tablet  Take 1 tablet by mouth daily.     polyethylene glycol packet  Commonly known as:  MIRALAX /  GLYCOLAX  Take 17 g by mouth 2 (two) times daily.     traZODone 50 MG tablet  Commonly known as:  DESYREL  Take 50 mg by mouth at bedtime as needed for sleep.     XARELTO 20 MG Tabs tablet  Generic drug:  Rivaroxaban  Take 20 mg by mouth daily with supper.         Signed: Anastasio AuerbachMatthew S. Infant Doane   PAC  12/24/2013, 10:07 AM

## 2014-01-10 ENCOUNTER — Ambulatory Visit (INDEPENDENT_AMBULATORY_CARE_PROVIDER_SITE_OTHER): Payer: Medicaid Other | Admitting: *Deleted

## 2014-01-10 DIAGNOSIS — I442 Atrioventricular block, complete: Secondary | ICD-10-CM

## 2014-01-22 LAB — MDC_IDC_ENUM_SESS_TYPE_REMOTE
Battery Voltage: 2.99 V
Date Time Interrogation Session: 20150312060023
HighPow Impedance: 50 Ohm
HighPow Impedance: 51 Ohm
Implantable Pulse Generator Model: 2411
Implantable Pulse Generator Serial Number: 1102557
Lead Channel Impedance Value: 510 Ohm
Lead Channel Pacing Threshold Pulse Width: 0.5 ms
Lead Channel Setting Pacing Amplitude: 2.5 V
Lead Channel Setting Pacing Pulse Width: 0.5 ms
MDC IDC MSMT BATTERY REMAINING LONGEVITY: 79 mo
MDC IDC MSMT BATTERY REMAINING PERCENTAGE: 86 %
MDC IDC MSMT LEADCHNL RV PACING THRESHOLD AMPLITUDE: 0.5 V
MDC IDC MSMT LEADCHNL RV SENSING INTR AMPL: 12 mV
MDC IDC SET LEADCHNL RV SENSING SENSITIVITY: 2 mV
MDC IDC STAT BRADY RV PERCENT PACED: 99 %
Zone Setting Detection Interval: 270 ms

## 2014-02-28 ENCOUNTER — Encounter: Payer: Self-pay | Admitting: *Deleted

## 2014-03-15 ENCOUNTER — Encounter: Payer: Self-pay | Admitting: Internal Medicine

## 2014-04-15 ENCOUNTER — Encounter: Payer: Self-pay | Admitting: *Deleted

## 2014-05-22 ENCOUNTER — Encounter: Payer: Self-pay | Admitting: *Deleted

## 2014-06-10 ENCOUNTER — Encounter: Payer: Medicaid Other | Admitting: *Deleted

## 2014-06-10 DIAGNOSIS — I442 Atrioventricular block, complete: Secondary | ICD-10-CM

## 2014-06-10 DIAGNOSIS — I4821 Permanent atrial fibrillation: Secondary | ICD-10-CM

## 2014-06-10 DIAGNOSIS — Z95810 Presence of automatic (implantable) cardiac defibrillator: Secondary | ICD-10-CM

## 2014-06-10 LAB — MDC_IDC_ENUM_SESS_TYPE_INCLINIC
Battery Remaining Longevity: 72 mo
Brady Statistic RA Percent Paced: 0 %
Brady Statistic RV Percent Paced: 99 %
Date Time Interrogation Session: 20150810155833
HIGH POWER IMPEDANCE MEASURED VALUE: 49.4859
Implantable Pulse Generator Serial Number: 1102557
Lead Channel Impedance Value: 362.5 Ohm
Lead Channel Impedance Value: 512.5 Ohm
Lead Channel Pacing Threshold Amplitude: 1 V
Lead Channel Setting Sensing Sensitivity: 2 mV
MDC IDC MSMT LEADCHNL RV PACING THRESHOLD PULSEWIDTH: 0.5 ms
MDC IDC MSMT LEADCHNL RV SENSING INTR AMPL: 12 mV
MDC IDC PG MODEL: 2411
MDC IDC SET LEADCHNL RV PACING AMPLITUDE: 2.5 V
MDC IDC SET LEADCHNL RV PACING PULSEWIDTH: 0.5 ms
Zone Setting Detection Interval: 270 ms

## 2014-06-27 ENCOUNTER — Encounter: Payer: Self-pay | Admitting: Internal Medicine

## 2014-09-18 ENCOUNTER — Encounter: Payer: Self-pay | Admitting: Internal Medicine

## 2014-09-18 ENCOUNTER — Ambulatory Visit (INDEPENDENT_AMBULATORY_CARE_PROVIDER_SITE_OTHER): Payer: Medicaid Other | Admitting: Internal Medicine

## 2014-09-18 VITALS — BP 118/82 | HR 70 | Ht 70.0 in | Wt 248.0 lb

## 2014-09-18 DIAGNOSIS — I4821 Permanent atrial fibrillation: Secondary | ICD-10-CM

## 2014-09-18 DIAGNOSIS — I442 Atrioventricular block, complete: Secondary | ICD-10-CM

## 2014-09-18 DIAGNOSIS — I10 Essential (primary) hypertension: Secondary | ICD-10-CM

## 2014-09-18 DIAGNOSIS — Z4502 Encounter for adjustment and management of automatic implantable cardiac defibrillator: Secondary | ICD-10-CM

## 2014-09-18 DIAGNOSIS — Z95810 Presence of automatic (implantable) cardiac defibrillator: Secondary | ICD-10-CM

## 2014-09-18 DIAGNOSIS — I482 Chronic atrial fibrillation: Secondary | ICD-10-CM

## 2014-09-18 DIAGNOSIS — Z716 Tobacco abuse counseling: Secondary | ICD-10-CM

## 2014-09-18 DIAGNOSIS — I422 Other hypertrophic cardiomyopathy: Secondary | ICD-10-CM

## 2014-09-18 LAB — MDC_IDC_ENUM_SESS_TYPE_INCLINIC
Battery Remaining Longevity: 69.6 mo
Brady Statistic RA Percent Paced: 0 %
Brady Statistic RV Percent Paced: 99 %
Date Time Interrogation Session: 20151118141044
HighPow Impedance: 54 Ohm
Implantable Pulse Generator Serial Number: 1102557
Lead Channel Impedance Value: 375 Ohm
Lead Channel Pacing Threshold Amplitude: 1 V
Lead Channel Sensing Intrinsic Amplitude: 12 mV
MDC IDC MSMT LEADCHNL RV IMPEDANCE VALUE: 550 Ohm
MDC IDC MSMT LEADCHNL RV PACING THRESHOLD AMPLITUDE: 1 V
MDC IDC MSMT LEADCHNL RV PACING THRESHOLD PULSEWIDTH: 0.5 ms
MDC IDC MSMT LEADCHNL RV PACING THRESHOLD PULSEWIDTH: 0.5 ms
MDC IDC PG MODEL: 2411
MDC IDC SET LEADCHNL RV PACING AMPLITUDE: 2.5 V
MDC IDC SET LEADCHNL RV PACING PULSEWIDTH: 0.5 ms
MDC IDC SET LEADCHNL RV SENSING SENSITIVITY: 2 mV
Zone Setting Detection Interval: 270 ms

## 2014-09-18 NOTE — Progress Notes (Signed)
Patient Care Team: Aura Dialsavid E Bouska, MD as PCP - General (Family Medicine)   HPI  Charles Le is a 58 y.o. male  With hypertrophic cardiomyopathy complicated by permanent atrial arrhythmias. He also has complete heart block and is status post ICD implantation for primary prevention.     Denies chest ain or sob;  he stopped smoking 2 weeks ago.  Echo 2012 demonstrated EF 50-55%  His blood pressures at home have been running in the 150/100 range.     Past Medical History  Diagnosis Date  . Hypertrophic obstructive cardiomyopathy     hypertrophic  . Aflutter     atypical-left sided  . DDD-ICD     [facility] [id number] ) dual chamber ICD implanted May 24, 2006)  . Atrioventricular block, complete     complete  . Hyperlipidemia   . Heart murmur   . Insomnia   . Anxiety   . Automatic implantable cardioverter-defibrillator in situ     GENERATOR CHANGE 2014  . GERD (gastroesophageal reflux disease)   . H/O hiatal hernia   . Arthritis     OA AND PAIN RT HIP;  S/P LEFT TOTAL HIP REPLACEMENT  . PONV (postoperative nausea and vomiting)     A LITTLE NAUSEA AFTER PREVIOUS HIP REPLACEMENT    Past Surgical History  Procedure Laterality Date  . Hip surgery      replacement  . Icd implantation      ICD- St Jude  . Cardioversion  2006  . Ventral hernia repair  11/22/2011    Procedure: HERNIA REPAIR VENTRAL ADULT;  Surgeon: Shelly Rubensteinouglas A Blackman, MD;  Location: [facility] OR;  Service: General;  Laterality: N/A;  Ventral hernia repair with mesh  . Hernia repair      VENTRAL HERNIA REPAIR 11/22/11  . Joint replacement  Oct 07, 2009    LEFT TOTAL HIP ARTHROPLASTY  . Total hip arthroplasty Right 12/18/2013    Procedure: RIGHT TOTAL HIP ARTHROPLASTY ANTERIOR APPROACH;  Surgeon: Shelda PalMatthew D Olin, MD;  Location: [facility] ORS;  Service: Orthopedics;  Laterality: Right;    Current Outpatient Prescriptions  Medication Sig Dispense Refill  . ALPRAZolam (XANAX) 0.25 MG tablet Take 0.5 mg by mouth 3  (three) times daily as needed for anxiety.     [name]. atorvastatin (LIPITOR) 20 MG tablet Take 20 mg by mouth every evening.     [name]. dexlansoprazole (DEXILANT) 60 MG capsule Take 60 mg by mouth every evening.    [name]. lisinopril-hydrochlorothiazide (PRINZIDE,ZESTORETIC) 20-12.5 MG per tablet Take 1 tablet by mouth every evening.    . Multiple Vitamin (MULTIVITAMIN WITH MINERALS) TABS Take 1 tablet by mouth daily.    . Rivaroxaban (XARELTO) 20 MG TABS tablet Take 20 mg by mouth daily with supper.    . traZODone (DESYREL) 50 MG tablet Take 50 mg by mouth at bedtime as needed for sleep.      No current facility-administered medications for this visit.    No Known Allergies  Review of Systems negative except from HPI and PMH  Physical Exam BP 118/82 mmHg  Pulse 70  Ht 5\' 10"  (1.778 m)  Wt 112.492 kg (248 lb)  BMI 35.58 kg/m2 Well developed and well nourished in no acute distress HENT normal E scleral and icterus clear Neck Supple JVP flat; carotids brisk and full Clear to ausculation Device pocket well healed; without hematoma or erythema Regular rate and rhythm, no murmurs gallops or rub Soft with active bowel sounds No clubbing  cyanosis none Edema Alert and oriented, grossly normal motor and sensory function Skin Warm and Dry  ECG demonstrates atrial fibrillation with underlying ventricular pacing  Assessment and  Plan  Hypertrophic cardiomyopathy complete heart block  ICD-St. Jude  Hypertension  Cigarette abuse\  He has stopped smoking the last couple of weeks. I suggested that he get patches is necessary but should try to withhold at this point since he been off cigarettes for almost 14 days  From a cardiomyopathic point of view and a blood pressure point review things are stable.  The patient's device was interrogated.  The information was reviewed.   No changes were made in the programming.    Blood pressures are better control.

## 2014-09-18 NOTE — Patient Instructions (Signed)
Your physician recommends that you continue on your current medications as directed. Please refer to the Current Medication list given to you today.  Remote monitoring is used to monitor your Pacemaker of ICD from home. This monitoring reduces the number of office visits required to check your device to one time per year. It allows [place] to keep an eye on the functioning of your device to ensure it is working properly. You are scheduled for a device check from home on 12/19/14. You may send your transmission at any time that day. If you have a wireless device, the transmission will be sent automatically. After your physician reviews your transmission, you will receive a postcard with your next transmission date.  Your physician wants you to follow-up in: 1 year with Dr. Graciela Husbands.  You will receive a reminder letter in the mail two months in advance. If you don't receive a letter, please call our office to schedule the follow-up appointment.

## 2014-09-24 ENCOUNTER — Encounter: Payer: Self-pay | Admitting: Internal Medicine

## 2014-09-27 NOTE — Telephone Encounter (Signed)
Received request from Nurse fax box, documents faxed for surgical clearance. To: [employer] Fax number: [phone] Attention: 1.27.15/SP

## 2014-10-10 ENCOUNTER — Encounter (HOSPITAL_COMMUNITY): Payer: Self-pay | Admitting: Internal Medicine

## 2014-10-28 ENCOUNTER — Other Ambulatory Visit: Payer: Self-pay | Admitting: Internal Medicine

## 2014-12-19 ENCOUNTER — Telehealth: Payer: Self-pay | Admitting: Cardiology

## 2014-12-19 ENCOUNTER — Encounter: Payer: Medicaid Other | Admitting: *Deleted

## 2014-12-19 NOTE — Telephone Encounter (Signed)
Attempted to confirm remote transmission with pt. No answer and was unable to leave a message.   

## 2014-12-20 ENCOUNTER — Encounter: Payer: Self-pay | Admitting: Cardiology

## 2015-01-30 ENCOUNTER — Encounter: Payer: Self-pay | Admitting: *Deleted

## 2015-02-26 ENCOUNTER — Encounter: Payer: Self-pay | Admitting: *Deleted

## 2015-04-04 ENCOUNTER — Encounter: Payer: Self-pay | Admitting: *Deleted

## 2015-04-29 ENCOUNTER — Encounter: Payer: Self-pay | Admitting: *Deleted

## 2015-05-27 ENCOUNTER — Encounter: Payer: Self-pay | Admitting: *Deleted

## 2015-06-04 ENCOUNTER — Ambulatory Visit (INDEPENDENT_AMBULATORY_CARE_PROVIDER_SITE_OTHER): Payer: Medicaid Other | Admitting: *Deleted

## 2015-06-04 DIAGNOSIS — I482 Chronic atrial fibrillation: Secondary | ICD-10-CM | POA: Diagnosis not present

## 2015-06-04 DIAGNOSIS — I4821 Permanent atrial fibrillation: Secondary | ICD-10-CM

## 2015-06-04 DIAGNOSIS — I442 Atrioventricular block, complete: Secondary | ICD-10-CM

## 2015-06-05 ENCOUNTER — Other Ambulatory Visit: Payer: Self-pay | Admitting: Internal Medicine

## 2015-06-06 LAB — CUP PACEART INCLINIC DEVICE CHECK
Brady Statistic RA Percent Paced: 0 %
Brady Statistic RV Percent Paced: 99 %
Date Time Interrogation Session: 20160803185826
HighPow Impedance: 57.3469
Lead Channel Pacing Threshold Amplitude: 0.75 V
Lead Channel Setting Pacing Amplitude: 2.5 V
Lead Channel Setting Sensing Sensitivity: 2 mV
MDC IDC MSMT BATTERY REMAINING LONGEVITY: 63.6 mo
MDC IDC MSMT LEADCHNL RA IMPEDANCE VALUE: 400 Ohm
MDC IDC MSMT LEADCHNL RV IMPEDANCE VALUE: 600 Ohm
MDC IDC MSMT LEADCHNL RV PACING THRESHOLD AMPLITUDE: 0.75 V
MDC IDC MSMT LEADCHNL RV PACING THRESHOLD PULSEWIDTH: 0.5 ms
MDC IDC MSMT LEADCHNL RV PACING THRESHOLD PULSEWIDTH: 0.5 ms
MDC IDC SET LEADCHNL RV PACING PULSEWIDTH: 0.5 ms
Pulse Gen Model: 2411
Pulse Gen Serial Number: 1102557
Zone Setting Detection Interval: 270 ms

## 2015-06-08 ENCOUNTER — Other Ambulatory Visit: Payer: Self-pay | Admitting: Internal Medicine

## 2015-06-10 ENCOUNTER — Other Ambulatory Visit: Payer: Self-pay | Admitting: Internal Medicine

## 2015-07-22 ENCOUNTER — Encounter: Payer: Self-pay | Admitting: Internal Medicine

## 2015-09-13 ENCOUNTER — Other Ambulatory Visit: Payer: Self-pay | Admitting: Internal Medicine

## 2015-09-17 ENCOUNTER — Encounter: Payer: Self-pay | Admitting: Internal Medicine

## 2015-09-17 ENCOUNTER — Ambulatory Visit (INDEPENDENT_AMBULATORY_CARE_PROVIDER_SITE_OTHER): Payer: Medicaid Other | Admitting: Internal Medicine

## 2015-09-17 ENCOUNTER — Telehealth: Payer: Self-pay | Admitting: *Deleted

## 2015-09-17 VITALS — BP 134/90 | HR 70 | Ht 70.0 in | Wt 245.3 lb

## 2015-09-17 DIAGNOSIS — I421 Obstructive hypertrophic cardiomyopathy: Secondary | ICD-10-CM

## 2015-09-17 DIAGNOSIS — I482 Chronic atrial fibrillation: Secondary | ICD-10-CM | POA: Diagnosis not present

## 2015-09-17 DIAGNOSIS — I4821 Permanent atrial fibrillation: Secondary | ICD-10-CM

## 2015-09-17 DIAGNOSIS — I442 Atrioventricular block, complete: Secondary | ICD-10-CM

## 2015-09-17 DIAGNOSIS — I4891 Unspecified atrial fibrillation: Secondary | ICD-10-CM

## 2015-09-17 LAB — CUP PACEART INCLINIC DEVICE CHECK
Battery Remaining Longevity: 58.8
Brady Statistic RA Percent Paced: 0 %
Brady Statistic RV Percent Paced: 99.68 %
Date Time Interrogation Session: 20161116165418
HighPow Impedance: 57 Ohm
Implantable Lead Implant Date: 20070724
Implantable Lead Implant Date: 20070724
Implantable Lead Location: 753859
Implantable Lead Model: 5076
Implantable Lead Model: 7001
Lead Channel Impedance Value: 375 Ohm
Lead Channel Impedance Value: 550 Ohm
Lead Channel Pacing Threshold Amplitude: 0.75 V
Lead Channel Pacing Threshold Amplitude: 0.75 V
Lead Channel Pacing Threshold Pulse Width: 0.5 ms
Lead Channel Pacing Threshold Pulse Width: 0.5 ms
Lead Channel Sensing Intrinsic Amplitude: 12 mV
Lead Channel Setting Sensing Sensitivity: 2 mV
MDC IDC LEAD LOCATION: 753860
MDC IDC PG SERIAL: 1102557
MDC IDC SET LEADCHNL RV PACING AMPLITUDE: 2.5 V
MDC IDC SET LEADCHNL RV PACING PULSEWIDTH: 0.5 ms

## 2015-09-17 NOTE — Telephone Encounter (Signed)
Dr. Graciela Husbands would like pt to have echo. I spoke with pt and appt made for echo on November 30,2016 at 2:00

## 2015-09-17 NOTE — Patient Instructions (Signed)
Medication Instructions:  Your physician recommends that you continue on your current medications as directed. Please refer to the Current Medication list given to you today.   Labwork: none  Testing/Procedures: none  Follow-Up: Your physician wants you to follow-up in: 12 months.  You will receive a reminder letter in the mail two months in advance. If you don't receive a letter, please call our office to schedule the follow-up appointment.  Remote monitoring is used to monitor your Pacemaker of ICD from home. This monitoring reduces the number of office visits required to check your device to one time per year. It allows [place] to keep an eye on the functioning of your device to ensure it is working properly. You are scheduled for a device check from home on 12/17/15. You may send your transmission at any time that day. If you have a wireless device, the transmission will be sent automatically. After your physician reviews your transmission, you will receive a postcard with your next transmission date.    Any Other Special Instructions Will Be Listed Below (If Applicable).     If you need a refill on your cardiac medications before your next appointment, please call your pharmacy.

## 2015-09-17 NOTE — Progress Notes (Signed)
Patient Care Team: Tracey Harries, MD as PCP - General (Family Medicine)   HPI  Charles Le is a 59 y.o. male  With hypertrophic cardiomyopathy complicated by permanent atrial arrhythmias. He also has complete heart block and is status post ICD implantation for primary prevention.     Denies chest ain or sob;  he stopped smoking 2 weeks ago.  Echo 2012 demonstrated EF 50-55%  He has been working out 4 days a week without issues of limitations  His blood pressures at home have been running in the 1:30/80 range.  Outside laboratories reviewed. LDL was 83 CL was 46 renal function was normal     Past Medical History  Diagnosis Date  . Hypertrophic obstructive cardiomyopathy     hypertrophic  . Aflutter     atypical-left sided  . DDD-ICD     [facility] [address] [phone] ) dual chamber ICD implanted May 24, 2006)  . Atrioventricular block, complete (HCC)     complete  . Hyperlipidemia   . Heart murmur   . Insomnia   . Anxiety   . Automatic implantable cardioverter-defibrillator in situ     GENERATOR CHANGE 2014  . GERD (gastroesophageal reflux disease)   . H/O hiatal hernia   . Arthritis     OA AND PAIN RT HIP;  S/P LEFT TOTAL HIP REPLACEMENT  . PONV (postoperative nausea and vomiting)     A LITTLE NAUSEA AFTER PREVIOUS HIP REPLACEMENT    Past Surgical History  Procedure Laterality Date  . Hip surgery      replacement  . Icd implantation      ICD- St Jude  . Cardioversion  2006  . Ventral hernia repair  11/22/2011    Procedure: HERNIA REPAIR VENTRAL ADULT;  Surgeon: Shelly Rubenstein, MD;  Location: [facility] OR;  Service: General;  Laterality: N/A;  Ventral hernia repair with mesh  . Hernia repair      VENTRAL HERNIA REPAIR 11/22/11  . Joint replacement  Oct 07, 2009    LEFT TOTAL HIP ARTHROPLASTY  . Total hip arthroplasty Right 12/18/2013    Procedure: RIGHT TOTAL HIP ARTHROPLASTY ANTERIOR APPROACH;  Surgeon: Shelda Pal, MD;  Location: [facility] ORS;  Service:  Orthopedics;  Laterality: Right;  . Implantable cardioverter defibrillator (icd) generator change N/A 02/21/2013    Procedure: ICD GENERATOR CHANGE;  Surgeon: Duke Salvia, MD;  [facility] CATH LAB;  Service: Cardiovascular;  Laterality: N/A;    Current Outpatient Prescriptions  Medication Sig Dispense Refill  . ALPRAZolam (XANAX) 0.5 MG tablet Take 0.5 mg by mouth.    [name] atorvastatin (LIPITOR) 20 MG tablet Take 20 mg by mouth every evening.     [name] dexlansoprazole (DEXILANT) 60 MG capsule Take 60 mg by mouth every evening.    [name] lisinopril-hydrochlorothiazide (PRINZIDE,ZESTORETIC) 20-12.5 MG tablet Take 1 tablet by mouth daily. 90 tablet 0  . Multiple Vitamin (MULTIVITAMIN WITH MINERALS) TABS Take 1 tablet by mouth daily.    . Omega-3 Fatty Acids (OMEGA-3 FISH OIL) 1200 MG CAPS Take 1 capsule by mouth daily.    . traZODone (DESYREL) 50 MG tablet Take 100 mg by mouth.    [name] 20 MG TABS tablet TAKE 1 TABLET BY MOUTH EVERY DAY 30 tablet 5  . zolpidem (AMBIEN CR) 12.5 MG CR tablet Take 12.5 mg by mouth at bedtime as needed for sleep.     No current facility-administered medications for this visit.    No Known Allergies  Review of Systems negative except from HPI and PMH  Physical Exam BP 134/90 mmHg  Pulse 70  Ht  (1.778 m)  Wt 245 lb 4.8 oz (111.267 kg)  BMI 35.20 kg/m2 Well developed and well nourished in no acute distress HENT normal E scleral and icterus clear Neck Supple JVP flat; carotids brisk and full Clear to ausculation Device pocket well healed; without hematoma or erythema Regular rate and rhythm, no murmurs gallops or rub Soft with active bowel sounds No clubbing cyanosis none Edema Alert and oriented, grossly normal motor and sensory function Skin Warm and Dry  ECG demonstrates atrial fibrillation with underlying ventricular pacing Heart rate 70 Baseline intervals-/19/46  Assessment and  Plan  Hypertrophic cardiomyopathy complete heart  block  ICD-St. Jude  Hypertension  Cigarette abuse\  He is no longer smoking.  His blood pressure home is normal.  He is exercising 40s week.   With his broad pacing, we will undertake an echocardiogram to look at LV function

## 2015-10-01 ENCOUNTER — Other Ambulatory Visit: Payer: Self-pay

## 2015-10-01 ENCOUNTER — Ambulatory Visit (HOSPITAL_COMMUNITY): Payer: Medicaid Other | Attending: Cardiovascular Disease

## 2015-10-01 DIAGNOSIS — I517 Cardiomegaly: Secondary | ICD-10-CM | POA: Diagnosis not present

## 2015-10-01 DIAGNOSIS — I4891 Unspecified atrial fibrillation: Secondary | ICD-10-CM | POA: Diagnosis not present

## 2015-10-01 DIAGNOSIS — I340 Nonrheumatic mitral (valve) insufficiency: Secondary | ICD-10-CM | POA: Insufficient documentation

## 2015-10-01 DIAGNOSIS — I421 Obstructive hypertrophic cardiomyopathy: Secondary | ICD-10-CM | POA: Diagnosis not present

## 2015-10-01 DIAGNOSIS — E785 Hyperlipidemia, unspecified: Secondary | ICD-10-CM | POA: Insufficient documentation

## 2015-10-03 ENCOUNTER — Telehealth: Payer: Self-pay | Admitting: Internal Medicine

## 2015-10-03 NOTE — Telephone Encounter (Signed)
I spoke with the patient and made him aware that Dr. Katrinka Blazing (DOD) on 11/29 said he did not require pre-medication for dental work, but he would not sign his "Medical Clearance Record" due to the fact he did not feel comfortable answering questions about contraindications to local anesthetic or additional precautions.  He is scheduled for teeth extractions to be done on 10/07/15 at 12:00 pm. I advised the patient I would call [facility] and Dr. Rica Records office and let them know the situation. I did speak with both offices and they were agreeable with me faxing this on Tuesday morning.

## 2015-10-03 NOTE — Telephone Encounter (Signed)
New message     Patient calling back on status of paper work that need to be fax over to his dentist office  Patient has upcoming procedure on Tuesday 12.6.2016   Fax # [facility]  [phone]

## 2015-12-17 ENCOUNTER — Ambulatory Visit (INDEPENDENT_AMBULATORY_CARE_PROVIDER_SITE_OTHER): Payer: Medicaid Other | Admitting: *Deleted

## 2015-12-17 DIAGNOSIS — I421 Obstructive hypertrophic cardiomyopathy: Secondary | ICD-10-CM

## 2015-12-17 NOTE — Progress Notes (Signed)
Remote ICD transmission.   

## 2016-01-11 ENCOUNTER — Other Ambulatory Visit: Payer: Self-pay | Admitting: Internal Medicine

## 2016-01-13 LAB — CUP PACEART REMOTE DEVICE CHECK
Brady Statistic RV Percent Paced: 98 %
HIGH POWER IMPEDANCE MEASURED VALUE: 53 Ohm
Implantable Lead Implant Date: 20070724
Implantable Lead Implant Date: 20070724
Lead Channel Impedance Value: 450 Ohm
Lead Channel Setting Pacing Amplitude: 2.5 V
Lead Channel Setting Pacing Pulse Width: 0.5 ms
Lead Channel Setting Sensing Sensitivity: 2 mV
MDC IDC LEAD LOCATION: 753859
MDC IDC LEAD LOCATION: 753860
MDC IDC LEAD MODEL: 7001
MDC IDC MSMT BATTERY REMAINING LONGEVITY: 54 mo
MDC IDC MSMT LEADCHNL RV SENSING INTR AMPL: 12 mV
MDC IDC SESS DTM: 20170314083909
Pulse Gen Serial Number: 1102557

## 2016-01-14 ENCOUNTER — Encounter: Payer: Self-pay | Admitting: Cardiology

## 2016-03-17 ENCOUNTER — Encounter: Payer: Medicaid Other | Admitting: *Deleted

## 2016-03-17 ENCOUNTER — Telehealth: Payer: Self-pay | Admitting: Cardiology

## 2016-03-17 NOTE — Telephone Encounter (Signed)
LMOVM reminding pt to send remote transmission.   

## 2016-03-19 ENCOUNTER — Encounter: Payer: Self-pay | Admitting: Cardiology

## 2016-03-23 ENCOUNTER — Ambulatory Visit (INDEPENDENT_AMBULATORY_CARE_PROVIDER_SITE_OTHER): Payer: Medicaid Other | Admitting: *Deleted

## 2016-03-23 DIAGNOSIS — I422 Other hypertrophic cardiomyopathy: Secondary | ICD-10-CM | POA: Diagnosis not present

## 2016-03-25 NOTE — Progress Notes (Signed)
Remote ICD transmission.   

## 2016-04-12 LAB — CUP PACEART REMOTE DEVICE CHECK
Date Time Interrogation Session: 20170612140410
HIGH POWER IMPEDANCE MEASURED VALUE: 58 Ohm
Implantable Lead Location: 753860
Implantable Lead Model: 5076
Implantable Lead Model: 7001
MDC IDC LEAD IMPLANT DT: 20070724
MDC IDC LEAD IMPLANT DT: 20070724
MDC IDC LEAD LOCATION: 753859
MDC IDC MSMT LEADCHNL RV IMPEDANCE VALUE: 490 Ohm
MDC IDC MSMT LEADCHNL RV SENSING INTR AMPL: 12 mV
Pulse Gen Serial Number: 1102557

## 2016-04-20 ENCOUNTER — Encounter: Payer: Self-pay | Admitting: Cardiology

## 2016-06-24 ENCOUNTER — Ambulatory Visit (INDEPENDENT_AMBULATORY_CARE_PROVIDER_SITE_OTHER): Payer: Medicaid Other | Admitting: *Deleted

## 2016-06-24 DIAGNOSIS — I422 Other hypertrophic cardiomyopathy: Secondary | ICD-10-CM | POA: Diagnosis not present

## 2016-06-24 NOTE — Progress Notes (Signed)
Remote ICD transmission.   

## 2016-06-29 LAB — CUP PACEART REMOTE DEVICE CHECK
Implantable Lead Implant Date: 20070724
Implantable Lead Location: 753859
Implantable Lead Model: 5076
Implantable Lead Model: 7001
MDC IDC LEAD IMPLANT DT: 20070724
MDC IDC LEAD LOCATION: 753860
MDC IDC PG SERIAL: 1102557
MDC IDC SESS DTM: 20170829154356

## 2016-07-01 ENCOUNTER — Encounter: Payer: Self-pay | Admitting: Cardiology

## 2016-09-22 ENCOUNTER — Other Ambulatory Visit: Payer: Self-pay | Admitting: Internal Medicine

## 2016-11-18 ENCOUNTER — Telehealth: Payer: Self-pay | Admitting: Pharmacist

## 2016-11-18 NOTE — Telephone Encounter (Signed)
Received fax from [facility] that pt is scheduled for a colonoscopy on 12/08/16. Pt takes Xarelto for afib with CHADS2 score of 1 (HTN). Ok to hold Xarelto for 1 day prior to colonoscopy. Clearance faxed to [phone].

## 2016-11-29 ENCOUNTER — Other Ambulatory Visit: Payer: Self-pay | Admitting: Internal Medicine

## 2016-12-03 ENCOUNTER — Other Ambulatory Visit: Payer: Self-pay | Admitting: Gastroenterology

## 2016-12-07 ENCOUNTER — Encounter (HOSPITAL_COMMUNITY): Payer: Self-pay | Admitting: *Deleted

## 2016-12-08 ENCOUNTER — Encounter (HOSPITAL_COMMUNITY): Payer: Self-pay | Admitting: *Deleted

## 2016-12-08 ENCOUNTER — Encounter (HOSPITAL_COMMUNITY)
Admission: RE | Disposition: A | Discharge status: Home / Self Care | Payer: Self-pay | Source: Ambulatory Visit | Attending: Gastroenterology

## 2016-12-08 ENCOUNTER — Ambulatory Visit (HOSPITAL_COMMUNITY): Payer: Medicaid Other | Admitting: Certified Registered Nurse Anesthetist

## 2016-12-08 ENCOUNTER — Ambulatory Visit (HOSPITAL_COMMUNITY)
Admission: RE | Admit: 2016-12-08 | Discharge: 2016-12-08 | Disposition: A | Discharge status: Home / Self Care | Payer: Medicaid Other | Source: Ambulatory Visit | Attending: Gastroenterology | Admitting: Gastroenterology

## 2016-12-08 DIAGNOSIS — K648 Other hemorrhoids: Secondary | ICD-10-CM | POA: Insufficient documentation

## 2016-12-08 DIAGNOSIS — K5730 Diverticulosis of large intestine without perforation or abscess without bleeding: Secondary | ICD-10-CM | POA: Diagnosis not present

## 2016-12-08 DIAGNOSIS — Z1211 Encounter for screening for malignant neoplasm of colon: Secondary | ICD-10-CM | POA: Diagnosis present

## 2016-12-08 DIAGNOSIS — Z86010 Personal history of colonic polyps: Secondary | ICD-10-CM | POA: Diagnosis not present

## 2016-12-08 HISTORY — PX: COLONOSCOPY WITH PROPOFOL: SHX5780

## 2016-12-08 SURGERY — COLONOSCOPY WITH PROPOFOL
Anesthesia: Monitor Anesthesia Care

## 2016-12-08 MED ORDER — EPHEDRINE SULFATE-NACL 50-0.9 MG/10ML-% IV SOSY
PREFILLED_SYRINGE | INTRAVENOUS | Status: DC | PRN
  Administered 2016-12-08: 10 mg via INTRAVENOUS

## 2016-12-08 MED ORDER — SODIUM CHLORIDE 0.9 % IV SOLN: INTRAVENOUS | Status: DC

## 2016-12-08 MED ORDER — PROPOFOL 10 MG/ML IV BOLUS
INTRAVENOUS | Status: AC
  Filled 2016-12-08: qty 20

## 2016-12-08 MED ORDER — EPHEDRINE 5 MG/ML INJ
INTRAVENOUS | Status: AC
  Filled 2016-12-08: qty 10

## 2016-12-08 MED ORDER — PROPOFOL 10 MG/ML IV BOLUS
INTRAVENOUS | Status: DC | PRN
  Administered 2016-12-08 (×4): 20 mg via INTRAVENOUS
  Administered 2016-12-08: 50 mg via INTRAVENOUS

## 2016-12-08 MED ORDER — LACTATED RINGERS IV SOLN
INTRAVENOUS | Status: DC
  Administered 2016-12-08: 1000 mL via INTRAVENOUS

## 2016-12-08 MED ORDER — PROPOFOL 10 MG/ML IV BOLUS
INTRAVENOUS | Status: AC
  Filled 2016-12-08: qty 40

## 2016-12-08 MED ORDER — PROPOFOL 500 MG/50ML IV EMUL
INTRAVENOUS | Status: DC | PRN
  Administered 2016-12-08: 150 ug/kg/min via INTRAVENOUS

## 2016-12-08 SURGICAL SUPPLY — 22 items

## 2016-12-08 NOTE — Anesthesia Preprocedure Evaluation (Signed)
Anesthesia Evaluation  Patient identified by MRN, date of birth, ID band Patient awake    Reviewed: Allergy & Precautions, NPO status , Patient's Chart, lab work & pertinent test results  History of Anesthesia Complications (+) PONV  Airway Mallampati: II  TM Distance: >3 FB Neck ROM: Full    Dental no notable dental hx. (+) Partial Upper, Caps   Pulmonary former smoker,    Pulmonary exam normal breath sounds clear to auscultation       Cardiovascular hypertension, Pt. on medications Normal cardiovascular exam+ dysrhythmias Atrial Fibrillation + pacemaker + Cardiac Defibrillator  Rhythm:Regular Rate:Normal     Neuro/Psych negative neurological ROS  negative psych ROS   GI/Hepatic negative GI ROS, Neg liver ROS,   Endo/Other  negative endocrine ROS  Renal/GU negative Renal ROS  negative genitourinary   Musculoskeletal negative musculoskeletal ROS (+)   Abdominal   Peds negative pediatric ROS (+)  Hematology negative hematology ROS (+)   Anesthesia Other Findings   Reproductive/Obstetrics negative OB ROS                             Anesthesia Physical Anesthesia Plan  ASA: III  Anesthesia Plan: MAC   Post-op Pain Management:    Induction: Intravenous  Airway Management Planned: Simple Face Mask  Additional Equipment:   Intra-op Plan:   Post-operative Plan:   Informed Consent: I have reviewed the patients History and Physical, chart, labs and discussed the procedure including the risks, benefits and alternatives for the proposed anesthesia with the patient or authorized representative who has indicated his/her understanding and acceptance.   Dental advisory given  Plan Discussed with: CRNA  Anesthesia Plan Comments:         Anesthesia Quick Evaluation

## 2016-12-08 NOTE — Discharge Instructions (Signed)
Colonoscopy ° °Post procedure instructions: ° °Read the instructions outlined below and refer to this sheet in the next few weeks. These discharge instructions provide you with general information on caring for yourself after you leave the hospital. Your doctor may also give you specific instructions. While your treatment has been planned according to the most current medical practices available, unavoidable complications occasionally occur. If you have any problems or questions after discharge, call Dr. Neola Worrall at [facility] ([phone]). ° °HOME CARE INSTRUCTIONS ° °ACTIVITY: °· You may resume your regular activity, but move at a slower pace for the next 24 hours.  °· Take frequent rest periods for the next 24 hours.  °· Walking will help get rid of the air and reduce the bloated feeling in your belly (abdomen).  °· No driving for 24 hours (because of the medicine (anesthesia) used during the test).  °· You may shower.  °· Do not sign any important legal documents or operate any machinery for 24 hours (because of the anesthesia used during the test).  °NUTRITION: °· Drink plenty of fluids.  °· You may resume your normal diet as instructed by your doctor.  °· Begin with a light meal and progress to your normal diet. Heavy or fried foods are harder to digest and may make you feel sick to your stomach (nauseated).  °· Avoid alcoholic beverages for 24 hours or as instructed.  °MEDICATIONS: °· You may resume your normal medications unless your doctor tells you otherwise.  °WHAT TO EXPECT TODAY: °· Some feelings of bloating in the abdomen.  °· Passage of more gas than usual.  °· Spotting of blood in your stool or on the toilet paper.  °IF YOU HAD POLYPS REMOVED DURING THE COLONOSCOPY: °· No aspirin products for 7 days or as instructed.  °· No alcohol for 7 days or as instructed.  °· Eat a soft diet for the next 24 hours.  ° °FINDING OUT THE RESULTS OF YOUR TEST ° °Not all test results are available during your  visit. If your test results are not back during the visit, make an appointment with your caregiver to find out the results. Do not assume everything is normal if you have not heard from your caregiver or the medical facility. It is important for you to follow up on all of your test results.  ° ° ° °SEEK IMMEDIATE MEDICAL CARE IF: ° °· You have more than a spotting of blood in your stool.  °· Your belly is swollen (abdominal distention).  °· You are nauseated or vomiting.  °· You have a fever.  °· You have abdominal pain or discomfort that is severe or gets worse throughout the day.  ° ° °Document Released: 06/01/2004 Document Revised: 06/30/2011 Document Reviewed: 05/30/2008 °ExitCare® Patient Information ©2012 [employer]. ° °

## 2016-12-08 NOTE — H&P (Signed)
Patient interval history reviewed.  Patient examined again.  There has been no change from documented H/P dated 11/10/16 (scanned into chart from our office) except as documented above.  Assessment:  1.  Personal history of colonic polyps.  Plan:  1.  Colonoscopy. 2.  Risks (bleeding, infection, bowel perforation that could require surgery, sedation-related changes in cardiopulmonary systems), benefits (identification and possible treatment of source of symptoms, exclusion of certain causes of symptoms), and alternatives (watchful waiting, radiographic imaging studies, empiric medical treatment) of colonoscopy were explained to patient/family in detail and patient wishes to proceed.

## 2016-12-08 NOTE — Op Note (Signed)
[facility] Patient Name: Charles Le Procedure Date: 12/08/2016 MRN: [id number] Attending MD: Willis Modena , MD Date of Birth: February 26, 1956 CSN: [id number] Age: 61 Admit Type: Outpatient Procedure:                Colonoscopy Indications:              High risk colon cancer surveillance: Personal                            history of colonic polyps, High risk colon cancer                            surveillance: Personal history of multiple (3 or                            more) adenomas, Last colonoscopy: January 2015 Providers:                Willis Modena, MD, Priscella Mann, RN, Arlee Muslim Tech., Technician, Randon Goldsmith, CRNA Referring MD:              Medicines:                Monitored Anesthesia Care Complications:            No immediate complications. Estimated Blood Loss:     Estimated blood loss: none. Procedure:                Pre-Anesthesia Assessment:                           - Prior to the procedure, a History and Physical                            was performed, and patient medications and                            allergies were reviewed. The patient's tolerance of                            previous anesthesia was also reviewed. The risks                            and benefits of the procedure and the sedation                            options and risks were discussed with the patient.                            All questions were answered, and informed consent                            was obtained. Prior Anticoagulants: The patient has  taken Xarelto (rivaroxaban), last dose was 3 days                            prior to procedure. ASA Grade Assessment: III - A                            patient with severe systemic disease. After                            reviewing the risks and benefits, the patient was                            deemed in satisfactory condition to undergo the                             procedure.                           After obtaining informed consent, the colonoscope                            was passed under direct vision. Throughout the                            procedure, the patient's blood pressure, pulse, and                            oxygen saturations were monitored continuously. The                            EC-[id number] ([id number]) scope was introduced through                            the anus and advanced to the the cecum, identified                            by appendiceal orifice and ileocecal valve. The                            ileocecal valve, appendiceal orifice, and rectum                            were photographed. The entire colon was examined.                            The colonoscopy was performed without difficulty.                            The patient tolerated the procedure well. The                            quality of the bowel preparation was good. Scope In: 11:01:02 AM Scope Out: 11:19:41 AM Scope Withdrawal Time: 0 hours 12 minutes 35 seconds  Total Procedure Duration:  0 hours 18 minutes 39 seconds  Findings:      The perianal and digital rectal examinations were normal.      Internal hemorrhoids were found during retroflexion. The hemorrhoids       were moderate.      No additional abnormalities were found on retroflexion.      A few small-mouthed diverticula were found in the sigmoid colon,       descending colon and transverse colon.      Colon otherwise normal; no other polyps, masses, vascular ectasias, or       inflammatory changes were seen. Impression:               - Internal hemorrhoids.                           - Diverticulosis in the sigmoid colon, in the                            descending colon and in the transverse colon.                           - Colon otherwise normal to the cecum. Moderate Sedation:      N/A- Per Anesthesia Care Recommendation:           - Patient has a contact  number available for                            emergencies. The signs and symptoms of potential                            delayed complications were discussed with the                            patient. Return to normal activities tomorrow.                            Written discharge instructions were provided to the                            patient.                           - Discharge patient to home (via wheelchair).                           - High fiber diet indefinitely.                           - Topical therapies (e.g., Preparation-H),                            avoidance of constipation/straining, liberal water                            intake, for management of hemorrhoids.                           -  Resume Xarelto (rivaroxaban) at prior dose today.                           - Repeat colonoscopy in 5 years for surveillance.                           - Return to GI clinic PRN.                           - Return to referring physician as previously                            scheduled. Procedure Code(s):        --- Professional ---                           [id number], Colonoscopy, flexible; diagnostic, including                            collection of specimen(s) by brushing or washing,                            when performed (separate procedure) Diagnosis Code(s):        --- Professional ---                           K64.8, Other hemorrhoids                           Z86.010, Personal history of colonic polyps                           K57.30, Diverticulosis of large intestine without                            perforation or abscess without bleeding CPT copyright 2016 American Medical Association. All rights reserved. The codes documented in this report are preliminary and upon coder review may  be revised to meet current compliance requirements. Willis Modena, MD 12/08/2016 11:30:17 AM This report has been signed electronically. Number of Addenda: 0

## 2016-12-08 NOTE — Transfer of Care (Signed)
Immediate Anesthesia Transfer of Care Note  Patient: CHEIKH BRAMBLE  Procedure(s) Performed: Procedure(s): COLONOSCOPY WITH PROPOFOL (N/A)  Patient Location: PACU  Anesthesia Type:MAC  Level of Consciousness: Patient easily awoken, sedated, comfortable, cooperative, following commands, responds to stimulation.   Airway & Oxygen Therapy: Patient spontaneously breathing, ventilating well, oxygen via simple oxygen mask.  Post-op Assessment: Report given to PACU RN, vital signs reviewed and stable, moving all extremities.   Post vital signs: Reviewed and stable.  Complications: No apparent anesthesia complications  Last Vitals:  Vitals:   12/08/16 0910  BP: (!) 134/94  Resp: 14  Temp: 36.6 C    Last Pain:  Vitals:   12/08/16 0910  TempSrc: Oral         Complications: No apparent anesthesia complications

## 2016-12-08 NOTE — Anesthesia Postprocedure Evaluation (Signed)
Anesthesia Post Note  Patient: Charles Le  Procedure(s) Performed: Procedure(s) (LRB): COLONOSCOPY WITH PROPOFOL (N/A)  Patient location during evaluation: Endoscopy Anesthesia Type: MAC Level of consciousness: awake and alert Pain management: pain level controlled Vital Signs Assessment: post-procedure vital signs reviewed and stable Respiratory status: spontaneous breathing, nonlabored ventilation, respiratory function stable and patient connected to nasal cannula oxygen Cardiovascular status: stable and blood pressure returned to baseline Anesthetic complications: no       Last Vitals:  Vitals:   12/08/16 1150 12/08/16 1153  BP:  (!) 139/93  Pulse: 70 70  Resp: 14 14  Temp:      Last Pain:  Vitals:   12/08/16 1127  TempSrc: Oral                 [name]

## 2016-12-09 ENCOUNTER — Encounter (HOSPITAL_COMMUNITY): Payer: Self-pay | Admitting: Gastroenterology

## 2016-12-21 NOTE — Progress Notes (Signed)
Electrophysiology Office Note Date: 12/23/2016  ID:  Charles, Le 1955-11-17, MRN [id number]  PCP: Charles Dials, MD Electrophysiologist: Charles Le  CC: Routine ICD follow-up  Charles Le is a 61 y.o. male seen today for Dr Charles Le.  He presents today for routine electrophysiology followup.  Since last being seen in our clinic, the patient reports doing very well. He denies chest pain, palpitations, dyspnea, PND, orthopnea, nausea, vomiting, dizziness, syncope, edema, weight gain, or early satiety.  He has not had ICD shocks.   Device History: STJ dual chamber ICD implanted 2007 for HCM, gen change 2014 History of appropriate therapy: no History of AAD therapy: no   Past Medical History:  Diagnosis Date  . Aflutter    atypical-left sided  . Anxiety   . Arthritis    OA AND PAIN RT HIP;  S/P LEFT TOTAL HIP REPLACEMENT  . Atrioventricular block, complete (HCC)    complete  . Automatic implantable cardioverter-defibrillator in situ    GENERATOR CHANGE 2014  . DDD-ICD    [facility] Atlas DR [id number] ) dual chamber ICD implanted May 24, 2006)  . GERD (gastroesophageal reflux disease)   . H/O hiatal hernia   . Heart murmur   . Hyperlipidemia   . Hypertr obst cardiomyop    hypertrophic  . Insomnia   . PONV (postoperative nausea and vomiting)    A LITTLE NAUSEA AFTER PREVIOUS HIP REPLACEMENT   Past Surgical History:  Procedure Laterality Date  . CARDIOVERSION  2006  . COLONOSCOPY WITH PROPOFOL N/A 12/08/2016   Procedure: COLONOSCOPY WITH PROPOFOL;  Surgeon: Charles Modena, MD;  Location: [facility] ENDOSCOPY;  Service: Endoscopy;  Laterality: N/A;  . HERNIA REPAIR     VENTRAL HERNIA REPAIR 11/22/11  . HIP SURGERY     replacement  . ICD implantation     ICD- St Jude  . IMPLANTABLE CARDIOVERTER DEFIBRILLATOR (ICD) GENERATOR CHANGE N/A 02/21/2013   Procedure: ICD GENERATOR CHANGE;  Surgeon: Charles Salvia, MD;  Location: [facility] CATH LAB;  Service: Cardiovascular;  Laterality: N/A;   . JOINT REPLACEMENT  Oct 07, 2009   LEFT TOTAL HIP ARTHROPLASTY  . TOTAL HIP ARTHROPLASTY Right 12/18/2013   Procedure: RIGHT TOTAL HIP ARTHROPLASTY ANTERIOR APPROACH;  Surgeon: Charles Pal, MD;  Location: [facility] ORS;  Service: Orthopedics;  Laterality: Right;  . VENTRAL HERNIA REPAIR  11/22/2011   Procedure: HERNIA REPAIR VENTRAL ADULT;  Surgeon: Charles Rubenstein, MD;  Location: [facility] OR;  Service: General;  Laterality: N/A;  Ventral hernia repair with mesh    Current Outpatient Prescriptions  Medication Sig Dispense Refill  . ALPRAZolam (XANAX) 0.5 MG tablet Take 0.5 mg by mouth 3 (three) times daily as needed for anxiety.     [name] atorvastatin (LIPITOR) 20 MG tablet Take 20 mg by mouth every evening.     . chlorhexidine (PERIDEX) 0.12 % solution 15 mLs by Mouth Rinse route daily as needed (gum health).   3  . dexlansoprazole (DEXILANT) 60 MG capsule Take 60 mg by mouth every evening.    [name] lisinopril-hydrochlorothiazide (PRINZIDE,ZESTORETIC) 20-12.5 MG tablet TAKE 1 TABLET BY MOUTH DAILY. 90 tablet 2  . Multiple Vitamin (MULTIVITAMIN WITH MINERALS) TABS Take 1 tablet by mouth daily.    . Omega-3 Fatty Acids (OMEGA-3 FISH OIL) 1200 MG CAPS Take 1 capsule by mouth daily.    . rivaroxaban (XARELTO) 20 MG TABS tablet Take 1 tablet (20 mg total) by mouth daily with supper. 30 tablet 0  .  Tetrahydrozoline HCl (VISINE OP) Apply 1 drop to eye daily as needed (dry eyes).    . traZODone (DESYREL) 50 MG tablet Take 50-100 mg by mouth at bedtime as needed for sleep.      No current facility-administered medications for this visit.     Allergies:   Patient has no known allergies.   Social History: Social History   Social History  . Marital status: Single    Spouse name: N/A  . Number of children: N/A  . Years of education: N/A   Occupational History  . Not on file.   Social History Main Topics  . Smoking status: Current Some Day Smoker    Packs/day: 0.50    Years: 18.00    Types: Cigarettes    . Smokeless tobacco: Never Used  . Alcohol use 0.0 oz/week     Comment: 1 - 2 times per week  . Drug use: No  . Sexual activity: Not on file   Other Topics Concern  . Not on file   Social History Narrative  . No narrative on file    Family History: Family History  Problem Relation Age of Onset  . Heart attack Father   . Diabetes Father   . Cancer Mother     breast  . Cancer Maternal Grandfather     prostate  . Hypertrophic cardiomyopathy Paternal Grandmother   . Diabetes Paternal Grandmother   . Hypertension Neg Hx     Review of Systems: All other systems reviewed and are otherwise negative except as noted above.   Physical Exam: VS:  BP 122/74   Pulse 84   Ht 5\' 10"  (1.778 m)   Wt 252 lb (114.3 kg)   BMI 36.16 kg/m  , BMI Body mass index is 36.16 kg/m.  GEN- The patient is well appearing, alert and oriented x 3 today.   HEENT: normocephalic, atraumatic; sclera clear, conjunctiva pink; hearing intact; oropharynx clear; neck supple Lungs- Clear to ausculation bilaterally, normal work of breathing.  No wheezes, rales, rhonchi Heart- Regular rate and rhythm (paced) GI- soft, non-tender, non-distended, bowel sounds present Extremities- no clubbing, cyanosis, or edema MS- no significant deformity or atrophy Skin- warm and dry, no rash or lesion; ICD pocket well healed Psych- euthymic mood, full affect Neuro- strength and sensation are intact  ICD interrogation- reviewed in detail today,  See PACEART report  EKG:  EKG is not ordered today.  Recent Labs: No results found for requested labs within last 8760 hours.   Wt Readings from Last 3 Encounters:  12/23/16 252 lb (114.3 kg)  12/08/16 240 lb (108.9 kg)  09/17/15 245 lb 4.8 oz (111.3 kg)     Other studies Reviewed: Additional studies/ records that were reviewed today include: Dr Charles Le office notes  Assessment and Plan:  1.  Hypertrophic cardiomyopathy  Stable on an appropriate medical  regimen Normal ICD function See Pace Art report No changes today  2.  Complete heart block Normal device function  3.  HTN Stable No change required today  4.  Permanent atrial fibrillation V rates controlled Continue Xarelto BMET, CBC today    Current medicines are reviewed at length with the patient today.   The patient does not have concerns regarding his medicines.  The following changes were made today:  none  Labs/ tests ordered today include:  Orders Placed This Encounter  Procedures  . Basic metabolic panel  . CBC     Disposition:   Follow up with Merlin,  Dr Charles Le 1 year    Signed, Gypsy Balsam, NP 12/23/2016 12:02 PM  [facility] [address] [place] [address] [phone] (office) [phone] (fax

## 2016-12-23 ENCOUNTER — Encounter: Payer: Self-pay | Admitting: Nurse Practitioner

## 2016-12-23 ENCOUNTER — Ambulatory Visit (INDEPENDENT_AMBULATORY_CARE_PROVIDER_SITE_OTHER): Payer: Medicaid Other | Admitting: Nurse Practitioner

## 2016-12-23 VITALS — BP 122/74 | HR 84 | Ht 70.0 in | Wt 252.0 lb

## 2016-12-23 DIAGNOSIS — I4821 Permanent atrial fibrillation: Secondary | ICD-10-CM

## 2016-12-23 DIAGNOSIS — I421 Obstructive hypertrophic cardiomyopathy: Secondary | ICD-10-CM | POA: Diagnosis not present

## 2016-12-23 DIAGNOSIS — I442 Atrioventricular block, complete: Secondary | ICD-10-CM

## 2016-12-23 DIAGNOSIS — I10 Essential (primary) hypertension: Secondary | ICD-10-CM

## 2016-12-23 DIAGNOSIS — I482 Chronic atrial fibrillation: Secondary | ICD-10-CM

## 2016-12-23 LAB — CUP PACEART INCLINIC DEVICE CHECK
Date Time Interrogation Session: 20180222120519
Implantable Lead Implant Date: 20070724
Implantable Lead Location: 753860
Implantable Pulse Generator Implant Date: 20140423
MDC IDC LEAD IMPLANT DT: 20070724
MDC IDC LEAD LOCATION: 753859
Pulse Gen Serial Number: 1102557

## 2016-12-23 NOTE — Patient Instructions (Addendum)
Medication Instructions:   Your physician recommends that you continue on your current medications as directed. Please refer to the Current Medication list given to you today.    If you need a refill on your cardiac medications before your next appointment, please call your pharmacy.  Labwork:  BMET AND CBC    Testing/Procedures: NONE ORDERED  TODAY    Follow-Up: Your physician wants you to follow-up in: ONE YEAR WITH klein   You will receive a reminder letter in the mail two months in advance. If you don't receive a letter, please call our office to schedule the follow-up appointment.  Remote monitoring is used to monitor your Pacemaker of ICD from home. This monitoring reduces the number of office visits required to check your device to one time per year. It allows [place] to keep an eye on the functioning of your device to ensure it is working properly. You are scheduled for a device check from home on . 03/23/2017 You may send your transmission at any time that day. If you have a wireless device, the transmission will be sent automatically. After your physician reviews your transmission, you will receive a postcard with your next transmission date.    Any Other Special Instructions Will Be Listed Below (If Applicable).

## 2016-12-24 LAB — BASIC METABOLIC PANEL
BUN/Creatinine Ratio: 19 (ref 9–20)
BUN: 23 mg/dL (ref 6–24)
CO2: 25 mmol/L (ref 18–29)
CREATININE: 1.21 mg/dL (ref 0.76–1.27)
Calcium: 10 mg/dL (ref 8.7–10.2)
Chloride: 99 mmol/L (ref 96–106)
GFR calc Af Amer: 76 (ref >59)
GFR, EST NON AFRICAN AMERICAN: 66 (ref >59)
Glucose: 92 mg/dL (ref 65–99)
Potassium: 4.3 mmol/L (ref 3.5–5.2)
SODIUM: 140 mmol/L (ref 134–144)

## 2016-12-24 LAB — CBC
HEMATOCRIT: 46.9 % (ref 37.5–51.0)
Hemoglobin: 15.6 g/dL (ref 13.0–17.7)
MCH: 30.2 pg (ref 26.6–33.0)
MCHC: 33.3 g/dL (ref 31.5–35.7)
MCV: 91 fL (ref 79–97)
PLATELETS: 206 10*3/uL (ref 150–379)
RBC: 5.17 x10E6/uL (ref 4.14–5.80)
RDW: 14.4 % (ref 12.3–15.4)
WBC: 8.2 10*3/uL (ref 3.4–10.8)

## 2016-12-29 ENCOUNTER — Other Ambulatory Visit: Payer: Self-pay | Admitting: Internal Medicine

## 2016-12-30 NOTE — Telephone Encounter (Signed)
Age 61 Wt 114.3kg (12/23/2016) Saw Gypsy Balsam NP 12/23/2016 12/23/2016 Hgb 15.6 HCT 46.9 SrCr 1.21 CrCl 107.5 Refill done on Xarelto 20 mg daily

## 2017-01-20 ENCOUNTER — Other Ambulatory Visit: Payer: Self-pay | Admitting: Internal Medicine

## 2017-05-12 NOTE — Anesthesia Postprocedure Evaluation (Signed)
Anesthesia Post Note  Patient: Charles Le  Procedure(s) Performed: Procedure(s) (LRB): COLONOSCOPY WITH PROPOFOL (N/A)     Anesthesia Post Evaluation  Last Vitals:  Vitals:   12/08/16 1150 12/08/16 1153  BP:  (!) 139/93  Pulse: 70 70  Resp: 14 14  Temp:      Last Pain:  Vitals:   12/08/16 1127  TempSrc: Oral                 Phillips Grout

## 2017-05-12 NOTE — Addendum Note (Signed)
Addendum  created 05/12/17 1359 by Phillips Grout, MD   Sign clinical note

## 2017-11-09 ENCOUNTER — Other Ambulatory Visit: Payer: Self-pay | Admitting: Surgery

## 2017-11-09 ENCOUNTER — Telehealth: Payer: Self-pay | Admitting: Internal Medicine

## 2017-11-09 NOTE — Telephone Encounter (Signed)
° °  [facility] Pre-operative Risk Assessment    Request for surgical clearance:  1. What type of surgery is being performed? Hernia repair  2. When is this surgery scheduled? Need clearance first  3. Are there any medications that need to be held prior to surgery and how long? XARELTO 20 MG TABS tablet 3 days pre op  4. Practice name and name of physician performing surgery? [facility], Dr. Rush Farmer  5. What is your office phone and fax number? [phone]   Fax: [phone]  6. Anesthesia type (None, local, MAC, general) ? General    _________________________________________________________________   (provider comments below)

## 2017-11-10 NOTE — Telephone Encounter (Signed)
Per Wynema Birch patient needed an appt before clearance was given. Called patient and gave him an appt to see Francis Dowse 12/01/17; patient agreeable with appointment.  [facility] to see if we could move patients appointment back to 2/6 or 2/7 but was unable to reach anyone left a message on the schedulers voicemail.

## 2017-11-10 NOTE — Telephone Encounter (Signed)
   Primary Cardiologist:Steven Graciela Husbands, MD  Chart reviewed as part of pre-operative protocol coverage. Because of Charles Le past medical history and time since last visit, he/she will require a follow-up visit in order to better assess preoperative cardiovascular risk.  Pre-op covering staff: - Please schedule appointment and call patient to inform them. - Please contact requesting surgeon's office via preferred method (i.e, phone, fax) to inform them of need for appointment prior to surgery.  Last office visit in Feb 2018, need followup with EP prior to surgery for clearance, especial in light of history of severe HOCM seen on previous echo in 2016. Please arrange early visit in case a repeat echo is needed.   Pharmacist to advise regarding how long to hold Xarelto, but clearance depend on followup  Azalee Course, PA  11/10/2017, 3:08 PM

## 2017-11-10 NOTE — Telephone Encounter (Signed)
Patient with diagnosis of Afib on Xarelto for anticoagulation.    Procedure: Hernia repair  Date of procedure: pending  CHADS2-VASc score of  2 (CHF, HTN, AGE, DM2, stroke/tia x 2, CAD, AGE, male)  CrCl 160ml/min  Per office protocol, patient can hold Xarelto for 24 hours prior to procedure.

## 2017-11-11 NOTE — Telephone Encounter (Signed)
Patient is scheduled for appointment with Francis Dowse, PA-C 12/01/17. Recommendations for anticoagulation have been made. Note will be closed out and removed from pre-op pool. This note will be cc'd to Francis Dowse, PA-C for her information when she sees patient on 12/01/17. Tereso Newcomer, PA-C    11/11/2017 8:45 AM

## 2017-11-15 ENCOUNTER — Encounter: Payer: Self-pay | Admitting: Physician Assistant

## 2017-11-15 NOTE — Telephone Encounter (Signed)
Faxed via epic to requesting office

## 2017-11-28 NOTE — Progress Notes (Addendum)
Cardiology Office Note Date:  12/01/2017  Patient ID:  Charles Le 22-Jun-1956, MRN [id number] PCP:  Tracey Harries, MD  Electrophysiologist:  Dr. Graciela Husbands   Chief Complaint:   pre-op  History of Present Illness: Charles Le is a 62 y.o. male with history of HOCM, permanent AFib, CHB, w/ICD, HTN, anxiety, HLD.  The patient comes today to be seen for Dr. Graciela Husbands, last seen by him in Nov 2016, at that time he was doing well, no changes were made to his tx.  Most recently saw for EP by A. Glory Buff, NP Feb 2018, again doing well, no changes were made.  The patient is feeling "great", no CP, palpitations or cardiac awareness at all.  No rest SOB, no DOE.  He denies any dizziness, no near syncope or syncope.  He sleeps with one pillow, denies amy symptoms of PND or orthopnea.  No bleeding or signs of bleeding with his xarelto.  He follows with his PMD office routinely, just had his atorvastatin increased, otherwise he reports his labs were reported to be "good", no mention of any history of kidney problems or issues.  He is pending a repair of an incisional hernia repair.  He goes to the gym 6 days a week, an hour or more, mostly weights though he states not so heavy to the point of needing to bear down, or to the point of not being able to finish his reps.  He does have goals of some mild bulk increase.  Not doing to much on the way of treadmill type exercise with b/l hip replacements and some issues with his hamstrings he is limited from an orrthopedic standipont with this, outside of that kind of "work out" he denies any exertional intolerances.   Device History: STJ dual chamber ICD implanted 2007 for HCM, gen change 2014 History of appropriate therapy: no History of AAD therapy: no DEVICE DEPENDENT   RCRI is 0.4, (last creat available is <2, no noted hx of CHF) low risk DASI score is 58.2, 9.89METS  Past Medical History:  Diagnosis Date  . Aflutter    atypical-left sided  . Anxiety    . Arthritis    OA AND PAIN RT HIP;  S/P LEFT TOTAL HIP REPLACEMENT  . Atrioventricular block, complete (HCC)    complete  . Automatic implantable cardioverter-defibrillator in situ    GENERATOR CHANGE 2014  . DDD-ICD    [facility] Atlas DR [id number] ) dual chamber ICD implanted May 24, 2006)  . GERD (gastroesophageal reflux disease)   . H/O hiatal hernia   . Heart murmur   . Hyperlipidemia   . Hypertr obst cardiomyop    hypertrophic  . Insomnia   . PONV (postoperative nausea and vomiting)    A LITTLE NAUSEA AFTER PREVIOUS HIP REPLACEMENT    Past Surgical History:  Procedure Laterality Date  . CARDIOVERSION  2006  . COLONOSCOPY WITH PROPOFOL N/A 12/08/2016   Procedure: COLONOSCOPY WITH PROPOFOL;  Surgeon: Willis Modena, MD;  Location: [facility];  Service: Endoscopy;  Laterality: N/A;  . HERNIA REPAIR     VENTRAL HERNIA REPAIR 11/22/11  . HIP SURGERY     replacement  . ICD implantation     ICD- St Jude  . IMPLANTABLE CARDIOVERTER DEFIBRILLATOR (ICD) GENERATOR CHANGE N/A 02/21/2013   Procedure: ICD GENERATOR CHANGE;  Surgeon: Duke Salvia, MD;  Location: [facility] CATH LAB;  Service: Cardiovascular;  Laterality: N/A;  . JOINT REPLACEMENT  Oct 07, 2009  LEFT TOTAL HIP ARTHROPLASTY  . TOTAL HIP ARTHROPLASTY Right 12/18/2013   Procedure: RIGHT TOTAL HIP ARTHROPLASTY ANTERIOR APPROACH;  Surgeon: Shelda Pal, MD;  Location: [facility] ORS;  Service: Orthopedics;  Laterality: Right;  . VENTRAL HERNIA REPAIR  11/22/2011   Procedure: HERNIA REPAIR VENTRAL ADULT;  Surgeon: Shelly Rubenstein, MD;  Location: [facility] OR;  Service: General;  Laterality: N/A;  Ventral hernia repair with mesh    Current Outpatient Medications  Medication Sig Dispense Refill  . ALPRAZolam (XANAX) 0.5 MG tablet Take 0.5 mg by mouth 3 (three) times daily as needed for anxiety.     [name] atorvastatin (LIPITOR) 20 MG tablet Take 20 mg by mouth every evening.     [name] atorvastatin (LIPITOR) 40 MG tablet Take 40 mg by mouth daily.     . chlorhexidine (PERIDEX) 0.12 % solution 15 mLs by Mouth Rinse route daily as needed (gum health).   3  . dexlansoprazole (DEXILANT) 60 MG capsule Take 60 mg by mouth every evening.    [name] lisinopril-hydrochlorothiazide (PRINZIDE,ZESTORETIC) 20-12.5 MG tablet TAKE 1 TABLET BY MOUTH DAILY. 90 tablet 3  . Multiple Vitamin (MULTIVITAMIN WITH MINERALS) TABS Take 1 tablet by mouth daily.    . Omega-3 Fatty Acids (OMEGA-3 FISH OIL) 1200 MG CAPS Take 1 capsule by mouth daily.    . Tetrahydrozoline HCl (VISINE OP) Apply 1 drop to eye daily as needed (dry eyes).    Charles Le 20 MG TABS tablet TAKE 1 TABLET (20 MG TOTAL) BY MOUTH DAILY WITH SUPPER. 30 tablet 5  . traZODone (DESYREL) 50 MG tablet Take 50-100 mg by mouth at bedtime as needed for sleep.      No current facility-administered medications for this visit.     Allergies:   Patient has no known allergies.   Social History:  The patient  reports that he has been smoking cigarettes.  He has a 9.00 pack-year smoking history. he has never used smokeless tobacco. He reports that he drinks alcohol. He reports that he does not use drugs.   Family History:  The patient's family history includes Cancer in his maternal grandfather and mother; Diabetes in his father and paternal grandmother; Heart attack in his father; Hypertrophic cardiomyopathy in his paternal grandmother.  ROS:  Please see the history of present illness.  All other systems are reviewed and otherwise negative.   PHYSICAL EXAM:  VS:  BP 136/74   Pulse 83   Ht 5\' 10"  (1.778 m)   Wt 272 lb (123.4 kg)   BMI 39.03 kg/m  BMI: Body mass index is 39.03 kg/m. Well nourished, well developed, in no acute distress  HEENT: normocephalic, atraumatic  Neck: no JVD, carotid bruits or masses Cardiac:  RRR; (paced), I do not appreciate any murmurs,  no rubs, or gallops Lungs:  CTA b/l, no wheezing, rhonchi or rales  Abd: soft, nontender MS: no deformity or atrophy Ext: no edema  Skin: warm  and dry, no rash Neuro:  No gross deficits appreciated Psych: euthymic mood, full affect  ICD site is stable, no tethering or discomfort   EKG:  Done today and reviewed by myself AFib, V paced ICD interrogation done today and reviewed by myself: battery and lead measurements are good, he has no R waves today at 30, no V episodes  10/01/15: TTE Study Conclusions - Left ventricle: Severe asymetric septal hypertrophy with abnormal   echogenicity suggesting HOCM or infitration. Patient cannot have   MRI   to assess unless pacer  is MRI compatible The cavity size was   normal. Wall thickness was increased in a pattern of severe LVH.   Systolic function was normal. The estimated ejection fraction was   in the range of 50% to 55%. - Mitral valve: Calcified annulus. Mildly thickened leaflets .   There was mild regurgitation. - Left atrium: The atrium was severely dilated. - Atrial septum: No defect or patent foramen ovale was identified.  09/14/11: TTE Study Conclusions - Left ventricle: The cavity size was normal. Wall thickness was increased in a pattern of severe LVH. There was moderate focal basal hypertrophy of the septum. Systolic function was normal. The estimated ejection fraction was in the range of 50% to 55%. Wall motion was normal; there were no regional wall motion abnormalities. Doppler parameters are consistent with restrictive physiology, indicative of decreased left ventricular diastolic compliance and/or increased left atrial pressure. - Mitral valve: There was moderate systolic anterior motion of the chordal structures. Mild regurgitation. - Left atrium: The atrium was severely dilated. - Right atrium: The atrium was mildly dilated. - Pulmonary arteries: PA peak pressure: 31mm Hg (S). Impressions: - Proximal septal thickening and SAM of the mitral valve create narrow LVOT; Mildly elevated LVOT velocity at rest of2.0 m/s.  Recent  Labs: 12/23/2016: BUN 23; Creatinine, Ser 1.21; Hemoglobin 15.6; Platelets 206; Potassium 4.3; Sodium 140  No results found for requested labs within last 8760 hours.   CrCl cannot be calculated (Patient's most recent lab result is older than the maximum 21 days allowed.).   Wt Readings from Last 3 Encounters:  12/01/17 272 lb (123.4 kg)  12/23/16 252 lb (114.3 kg)  12/08/16 240 lb (108.9 kg)     Other studies reviewed: Additional studies/records reviewed today include: summarized above  ASSESSMENT AND PLAN:  1. ICD, CHB     Intact function.  He is not doing remotes, no longer has land line access    He is device dependent      We have ordered him a cell adapter, and scheduled for device clinic check in 3 months, and then will get him established with remotes  2. HOCM     Weight is up, though no symptoms or exam findings to suggest fluid OL     Optivol looks good     No reports of syncope      Discussed with the patient today caution with weight training,vagal maneuver/risk of syncope/injury, and contraindication with weight training, bulking up, better to use low weight/increased reps to keep lean muscle, he states understanding.  (also discussed not advised given issue with recurrent hernia)  Discussed with Dr. Graciela Husbands, no particular peri-operative recommendations w/HOCM   3. Permanent AFib     CHA2DS2Vasc is one, maintained on Xarelto     Recent labs by PMD, we will request from his PMD office      [facility] has recommended 1 day off Xarelto pre-operatively by our clinic standard.  I discussed this with the patient, if his surgeon needs 3 days to reach out to [place].  4. HTN     Looks good, no changes  5. Pre-op RCRI is 0.4 , low risk score DASI score is 58.2, 9.89 METS His peri-operative risk score is low, additionally his DUKE score is very good, and would be an acceptable cardiac risk for surgery planned without additional work up.  He will need routine peri-operative  defibrillator management RPH has recommended 1 day off Xarelto pre-operatively by our clinic standard.  I discussed this  with the patient, if his surgeon needs 3 days to reach out to [place].    Disposition: F/u with 3 month visit to device clinic 1 year in-clinic EP visit, sooner if needed   Current medicines are reviewed at length with the patient today.  The patient did not have any concerns regarding medicines.  Norma Fredrickson, PA-C 12/01/2017 2:46 PM     [facility] [address] [place] [address] [phone] (office)  [phone] (fax)

## 2017-12-01 ENCOUNTER — Ambulatory Visit: Payer: Medicaid Other | Admitting: Physician Assistant

## 2017-12-01 VITALS — BP 136/74 | HR 83 | Ht 70.0 in | Wt 272.0 lb

## 2017-12-01 DIAGNOSIS — Z01818 Encounter for other preprocedural examination: Secondary | ICD-10-CM | POA: Diagnosis not present

## 2017-12-01 DIAGNOSIS — I10 Essential (primary) hypertension: Secondary | ICD-10-CM

## 2017-12-01 DIAGNOSIS — I482 Chronic atrial fibrillation: Secondary | ICD-10-CM | POA: Diagnosis not present

## 2017-12-01 DIAGNOSIS — I421 Obstructive hypertrophic cardiomyopathy: Secondary | ICD-10-CM | POA: Diagnosis not present

## 2017-12-01 DIAGNOSIS — Z95810 Presence of automatic (implantable) cardiac defibrillator: Secondary | ICD-10-CM

## 2017-12-01 DIAGNOSIS — I442 Atrioventricular block, complete: Secondary | ICD-10-CM | POA: Diagnosis not present

## 2017-12-01 DIAGNOSIS — I4821 Permanent atrial fibrillation: Secondary | ICD-10-CM

## 2017-12-01 NOTE — Patient Instructions (Addendum)
Medication Instructions:    Your physician recommends that you continue on your current medications as directed. Please refer to the Current Medication list given to you today.   If you need a refill on your cardiac medications before your next appointment, please call your pharmacy.  Labwork: NONE ORDERED  TODAY     Testing/Procedures: NONE ORDERED  TODAY    Follow-Up:  IN 3 MONTHS WITH DEVICE CLINIC  FOR 85 DAY DEVICE CHECK   Your physician wants you to follow-up in: ONE YEAR WITH  Graciela Husbands  You will receive a reminder letter in the mail two months in advance. If you don't receive a letter, please call our office to schedule the follow-up appointment.     Any Other Special Instructions Will Be Listed Below (If Applicable).

## 2017-12-02 ENCOUNTER — Telehealth: Payer: Self-pay | Admitting: *Deleted

## 2017-12-02 ENCOUNTER — Telehealth: Payer: Self-pay | Admitting: Internal Medicine

## 2017-12-02 NOTE — Telephone Encounter (Signed)
SPOKE TO PT AND INFORMATION NEEDED WAS GATHERED

## 2017-12-02 NOTE — Telephone Encounter (Signed)
Charles Le is returning your call  to give you the name of the surgeon  who will do his hernia surgery which is Dr. Loretha Brasil  . Thanks

## 2017-12-02 NOTE — Telephone Encounter (Signed)
LMOVM TO CALL BACK WITH SURGEON NAME

## 2017-12-20 NOTE — Pre-Procedure Instructions (Signed)
Charles Le  12/20/2017      [id number] - [place], [place] - [phone] [place] [address] Phone: [phone] Fax: [phone]    Your procedure is scheduled on Monday February 25.  Report to [facility] Admitting at 5:30 A.M.  Call this number if you have problems the morning of surgery:  [phone]   Remember:  Do not eat food or drink liquids after midnight.  Take these medicines the morning of surgery with A SIP OF WATER:   Dexlansoprazole (Dexilant) Alprazolam (Xanax) if needed Eye drops  7 days prior to surgery STOP taking any Aspirin(unless otherwise instructed by your surgeon), Aleve, Naproxen, Ibuprofen, Motrin, Advil, Goody's, BC's, all herbal medications, fish oil, and all vitamins  **Follow your surgeon's instructions on stopping Xarelto. If no instructions were given, please call your surgeon's office.**    Do not wear jewelry, make-up or nail polish.  Do not wear lotions, powders, or perfumes, or deodorant.  Do not shave 48 hours prior to surgery.  Men may shave face and neck.  Do not bring valuables to the hospital.  [facility] is not responsible for any belongings or valuables.  Contacts, dentures or bridgework may not be worn into surgery.  Leave your suitcase in the car.  After surgery it may be brought to your room.  For patients admitted to the hospital, discharge time will be determined by your treatment team.  Patients discharged the day of surgery will not be allowed to drive home.    Special instructions:    [facility]- Preparing For Surgery  Before surgery, you can play an important role. Because skin is not sterile, your skin needs to be as free of germs as possible. You can reduce the number of germs on your skin by washing with CHG (chlorahexidine gluconate) Soap before surgery.  CHG is an antiseptic cleaner which kills germs and bonds with the skin to continue killing germs even after  washing.  Please do not use if you have an allergy to CHG or antibacterial soaps. If your skin becomes reddened/irritated stop using the CHG.  Do not shave (including legs and underarms) for at least 48 hours prior to first CHG shower. It is OK to shave your face.  Please follow these instructions carefully.   1. Shower the NIGHT BEFORE SURGERY and the MORNING OF SURGERY with CHG.   2. If you chose to wash your hair, wash your hair first as usual with your normal shampoo.  3. After you shampoo, rinse your hair and body thoroughly to remove the shampoo.  4. Use CHG as you would any other liquid soap. You can apply CHG directly to the skin and wash gently with a scrungie or a clean washcloth.   5. Apply the CHG Soap to your body ONLY FROM THE NECK DOWN.  Do not use on open wounds or open sores. Avoid contact with your eyes, ears, mouth and genitals (private parts). Wash Face and genitals (private parts)  with your normal soap.  6. Wash thoroughly, paying special attention to the area where your surgery will be performed.  7. Thoroughly rinse your body with warm water from the neck down.  8. DO NOT shower/wash with your normal soap after using and rinsing off the CHG Soap.  9. Pat yourself dry with a CLEAN TOWEL.  10. Wear CLEAN PAJAMAS to bed the night before surgery, wear comfortable clothes the morning of surgery  11.  Place CLEAN SHEETS on your bed the night of your first shower and DO NOT SLEEP WITH PETS.    Day of Surgery: Do not apply any deodorants/lotions. Please wear clean clothes to the hospital/surgery center.      Please read over the following fact sheets that you were given. Coughing and Deep Breathing and Surgical Site Infection Prevention

## 2017-12-21 ENCOUNTER — Encounter (HOSPITAL_COMMUNITY): Payer: Self-pay

## 2017-12-21 ENCOUNTER — Encounter (HOSPITAL_COMMUNITY)
Admission: RE | Admit: 2017-12-21 | Discharge: 2017-12-21 | Disposition: A | Discharge status: Home / Self Care | Payer: Medicaid Other | Source: Ambulatory Visit | Attending: Surgery | Admitting: Surgery

## 2017-12-21 ENCOUNTER — Other Ambulatory Visit: Payer: Self-pay

## 2017-12-21 DIAGNOSIS — Z01812 Encounter for preprocedural laboratory examination: Secondary | ICD-10-CM | POA: Insufficient documentation

## 2017-12-21 LAB — BASIC METABOLIC PANEL
ANION GAP: 12 (ref 5–15)
BUN: 19 mg/dL (ref 6–20)
CHLORIDE: 103 mmol/L (ref 101–111)
CO2: 23 mmol/L (ref 22–32)
Calcium: 9.5 mg/dL (ref 8.9–10.3)
Creatinine, Ser: 1.56 mg/dL — ABNORMAL HIGH (ref 0.61–1.24)
GFR calc Af Amer: 54 mL/min — ABNORMAL LOW (ref >60)
GFR, EST NON AFRICAN AMERICAN: 47 mL/min — AB (ref >60)
Glucose, Bld: 90 mg/dL (ref 65–99)
POTASSIUM: 4.7 mmol/L (ref 3.5–5.1)
SODIUM: 138 mmol/L (ref 135–145)

## 2017-12-21 LAB — CBC
HEMATOCRIT: 49.7 % (ref 39.0–52.0)
Hemoglobin: 16.4 g/dL (ref 13.0–17.0)
MCH: 32.9 pg (ref 26.0–34.0)
MCHC: 33 g/dL (ref 30.0–36.0)
MCV: 99.8 fL (ref 78.0–100.0)
Platelets: 237 10*3/uL (ref 150–400)
RBC: 4.98 MIL/uL (ref 4.22–5.81)
RDW: 13.9 % (ref 11.5–15.5)
WBC: 7.9 10*3/uL (ref 4.0–10.5)

## 2017-12-21 NOTE — Progress Notes (Signed)
Anesthesia Chart Review:  Pt is a 61 year old male scheduled for incisional hernia repair with mesh on 12/26/2017 with Loretha Brasil, MD  - PCP is Tracey Harries, MD (notes in care everywhere)  - EP cardiologist is Sherryl Manges, MD. Cleared for surgery at last office visit 12/01/17 with Francis Dowse, PA  PMH includes: Hypertrophic obstructive myopathy complete heart block, AICD (St. Jude, implanted 2007; generator change 2014), permanent afib, hyperlipidemia, post-op N/V, GERD.  Current smoker.  BMI 37.5.  S/p R THA 12/18/13. S/p ventral hernia repair 11/22/11  Medications include: Lipitor, Dexilant, lisinopril-HCTZ, Xarelto.  Last dose Xarelto 12/24/17.  BP 126/68   Pulse 77   Temp 36.7 C   Resp 20   Ht 5\' 11"  (1.803 m)   Wt 268 lb 8 oz (121.8 kg)   SpO2 96%   BMI 37.45 kg/m   Preoperative labs reviewed.   - PT/INR will be obtained day of surgery - Cr 1.56, BUN 19.  Most recent comparison Cr was 1.41 on 08/01/17 at PCP's office (care everywhere), Cr was 1.2 about a year ago.  I routed results to PCP for f/u purposes.   EKG 12/01/17: AFib, V paced  Echo 10/01/15:  - Left ventricle: Severe asymetric septal hypertrophy with abnormal echogenicity suggesting HOCM or infitration. Patient cannot have MRI to assess unless pacer is MRI compatible The cavity size was normal. Wall thickness was increased in a pattern of severe LVH. Systolic function was normal. The estimated ejection fraction was in the range of 50% to 55%. - Mitral valve: Calcified annulus. Mildly thickened leaflets. There was mild regurgitation. - Left atrium: The atrium was severely dilated. - Atrial septum: No defect or patent foramen ovale was identified.  Perioperative prescription for ICD form pending.  If no changes, I anticipate pt can proceed with surgery as scheduled.   Rica Mast, FNP-BC [facility]/Anesthesiology Phone: [phone] 12/21/2017 4:32 PM

## 2017-12-21 NOTE — Progress Notes (Signed)
PCP - Tracey Harries Cardiologist - Sherryl Manges   EKG - 12/01/17 ECHO - 10/01/15  Pt to take last dose of Xarelto on 12/24/17.   Pt with PPM/ICD- Form faxed to Dr. Graciela Husbands, orders received and placed in chart and Arlys John with [facility] notified of orders/date/procedure time.   Anesthesia review: ICD  Patient denies shortness of breath, fever, cough and chest pain at PAT appointment   Patient verbalized understanding of instructions that were given to them at the PAT appointment. Patient was also instructed that they will need to review over the PAT instructions again at home before surgery.

## 2017-12-21 NOTE — Progress Notes (Signed)
   12/21/17 1408  OBSTRUCTIVE SLEEP APNEA  Have you ever been diagnosed with sleep apnea through a sleep study? No  Do you snore loudly (loud enough to be heard through closed doors)?  1  Do you often feel tired, fatigued, or sleepy during the daytime (such as falling asleep during driving or talking to someone)? 0  Has anyone observed you stop breathing during your sleep? 0  Do you have, or are you being treated for high blood pressure? 1  BMI more than 35 kg/m2? 1  Age > 50 (1-yes) 1  Neck circumference greater than:Male 16 inches or larger, Male 17inches or larger? 1  Male Gender (Yes=1) 1  Obstructive Sleep Apnea Score 6  Score 5 or greater  Results sent to PCP

## 2017-12-23 MED ORDER — DEXTROSE 5 % IV SOLN
3.0000 g | INTRAVENOUS | Status: AC
  Filled 2017-12-23: qty 3000

## 2017-12-25 NOTE — H&P (Signed)
Genia Hotter  Location: [facility] Surgery Patient #: [id number] DOB: Dec 22, 1955 Single / Language: [place] / Race: White Male   History of Present Illness   The patient is a 62 year old male who presents with an incisional hernia. This is a very pleasant 62 year old gentleman that I repaired a ventral hernia with mesh back in January 2013. It was a very small fascial defect and it was repaired with a 4.3 centimeter round ventral patch. He reports that he recently felt a small bulge in the area of the repair and has some slight discomfort. He had a CT scan showing a very small recurrent hernia which was about a centimeter in size from a fascial defect standpoint. He has minimal discomfort and no obstructive symptoms. He remains on blood thinning medication secondary to his defibrillator. He has been doing well from a cardiac standpoint. Since I saw him last, he has had hip replacement surgery   Past Surgical History   Colon Polyp Removal - Colonoscopy  Hip Surgery  Bilateral. Oral Surgery  Valve Replacement   Diagnostic Studies History  Colonoscopy  1-5 years ago  Allergies No Known Drug Allergies   Medication History Atorvastatin Calcium (20MG  Tablet, Oral) Active. Lisinopril-Hydrochlorothiazide (20-12.5MG  Tablet, Oral) Active. Dexilant (60MG  Capsule DR, Oral) Active. Xarelto (20MG  Tablet, Oral) Active. TraZODone HCl (50MG  Tablet, Oral) Active. ALPRAZolam (0.5MG  Tablet, Oral) Active. Medications Reconciled  Social History  Alcohol use  Moderate alcohol use. Illicit drug use  Remotely quit drug use. No caffeine use  Tobacco use  Current some day smoker.  Family History  Alcohol Abuse  Brother. Breast Cancer  Mother. Diabetes Mellitus  Father. Heart disease in male family member before age 63  Hypertension  Brother, Father. Kidney Disease  Father.  Other Problems  Anxiety Disorder  Gastroesophageal Reflux Disease  Heart murmur      Review of Systems   General Not Present- Appetite Loss, Chills, Fatigue, Fever, Night Sweats, Weight Gain and Weight Loss. Skin Not Present- Change in Wart/Mole, Dryness, Hives, Jaundice, New Lesions, Non-Healing Wounds, Rash and Ulcer. HEENT Not Present- Earache, Hearing Loss, Hoarseness, Nose Bleed, Oral Ulcers, Ringing in the Ears, Seasonal Allergies, Sinus Pain, Sore Throat, Visual Disturbances, Wears glasses/contact lenses and Yellow Eyes. Respiratory Not Present- Bloody sputum, Chronic Cough, Difficulty Breathing, Snoring and Wheezing. Breast Not Present- Breast Mass, Breast Pain, Nipple Discharge and Skin Changes. Cardiovascular Not Present- Chest Pain, Difficulty Breathing Lying Down, Leg Cramps, Palpitations, Rapid Heart Rate, Shortness of Breath and Swelling of Extremities. Gastrointestinal Present- Abdominal Pain. Not Present- Bloating, Bloody Stool, Change in Bowel Habits, Chronic diarrhea, Constipation, Difficulty Swallowing, Excessive gas, Gets full quickly at meals, Hemorrhoids, Indigestion, Nausea, Rectal Pain and Vomiting. Male Genitourinary Not Present- Blood in Urine, Change in Urinary Stream, Frequency, Impotence, Nocturia, Painful Urination, Urgency and Urine Leakage. Musculoskeletal Not Present- Back Pain, Joint Pain, Joint Stiffness, Muscle Pain, Muscle Weakness and Swelling of Extremities. Neurological Not Present- Decreased Memory, Fainting, Headaches, Numbness, Seizures, Tingling, Tremor, Trouble walking and Weakness. Psychiatric Present- Anxiety. Not Present- Bipolar, Change in Sleep Pattern, Depression, Fearful and Frequent crying. Endocrine Not Present- Cold Intolerance, Excessive Hunger, Hair Changes, Heat Intolerance, Hot flashes and New Diabetes. Hematology Not Present- Blood Thinners, Easy Bruising, Excessive bleeding, Gland problems, HIV and Persistent Infections.  Vitals   Weight: 267.25 lb Height: 71in Body Surface Area: 2.39 m Body Mass Index:  37.27 kg/m  Pulse: 97 (Regular)  BP: 126/84 (Sitting, Left Arm, Standard)   Physical Exam ( The physical  exam findings are as follows: Note:On exam, he is awake and alert. He looks great. His abdomen is obese. There is a very small recurrent hernia at the site of his surgical scar just to the left of midline. It is minimally tender. Lungs clear CV RRR Skin without rashes Psych normal    Assessment & Plan   INCISIONAL HERNIA (K43.2)  Impression: This is a recurrent hernia. Because of his need for chronic anticoagulation, repair of this hernia is recommended to keep it from getting larger and ever becoming emergent. He is also having discomfort from the hernia. I believe it is very small do this as an outpatient only stopping his blood thinning medication for 3 days. I will begin use mesh. We did discuss laparoscopic repair but He is interested in outpatient surgery. We discussed the surgical procedure in detail. We discussed the risk which includes but is not limited to bleeding, infection, hernia recurrence, cardiopulmonary issues, etc.

## 2017-12-26 ENCOUNTER — Ambulatory Visit (HOSPITAL_COMMUNITY): Payer: Medicaid Other | Admitting: Anesthesiology

## 2017-12-26 ENCOUNTER — Encounter (HOSPITAL_COMMUNITY): Payer: Self-pay | Admitting: Certified Registered Nurse Anesthetist

## 2017-12-26 ENCOUNTER — Ambulatory Visit (HOSPITAL_COMMUNITY): Payer: Medicaid Other | Admitting: Emergency Medicine

## 2017-12-26 ENCOUNTER — Encounter (HOSPITAL_COMMUNITY)
Admission: RE | Disposition: A | Discharge status: Home / Self Care | Payer: Self-pay | Source: Ambulatory Visit | Attending: Surgery

## 2017-12-26 ENCOUNTER — Ambulatory Visit (HOSPITAL_COMMUNITY)
Admission: RE | Admit: 2017-12-26 | Discharge: 2017-12-26 | Disposition: A | Discharge status: Home / Self Care | Payer: Medicaid Other | Source: Ambulatory Visit | Attending: Surgery | Admitting: Surgery

## 2017-12-26 DIAGNOSIS — F419 Anxiety disorder, unspecified: Secondary | ICD-10-CM | POA: Diagnosis not present

## 2017-12-26 DIAGNOSIS — Z952 Presence of prosthetic heart valve: Secondary | ICD-10-CM | POA: Diagnosis not present

## 2017-12-26 DIAGNOSIS — Z7901 Long term (current) use of anticoagulants: Secondary | ICD-10-CM | POA: Insufficient documentation

## 2017-12-26 DIAGNOSIS — K432 Incisional hernia without obstruction or gangrene: Secondary | ICD-10-CM | POA: Diagnosis not present

## 2017-12-26 DIAGNOSIS — Z79899 Other long term (current) drug therapy: Secondary | ICD-10-CM | POA: Diagnosis not present

## 2017-12-26 DIAGNOSIS — I4891 Unspecified atrial fibrillation: Secondary | ICD-10-CM | POA: Insufficient documentation

## 2017-12-26 DIAGNOSIS — K219 Gastro-esophageal reflux disease without esophagitis: Secondary | ICD-10-CM | POA: Insufficient documentation

## 2017-12-26 DIAGNOSIS — F17200 Nicotine dependence, unspecified, uncomplicated: Secondary | ICD-10-CM | POA: Insufficient documentation

## 2017-12-26 HISTORY — PX: INCISIONAL HERNIA REPAIR: SHX193

## 2017-12-26 HISTORY — PX: INSERTION OF MESH: SHX5868

## 2017-12-26 LAB — PROTIME-INR
INR: 1.13
PROTHROMBIN TIME: 14.4 s (ref 11.4–15.2)

## 2017-12-26 SURGERY — REPAIR, HERNIA, INCISIONAL
Anesthesia: General | Site: Abdomen

## 2017-12-26 MED ORDER — LIDOCAINE 2% (20 MG/ML) 5 ML SYRINGE
INTRAMUSCULAR | Status: DC | PRN
  Administered 2017-12-26: 60 mg via INTRAVENOUS

## 2017-12-26 MED ORDER — MEPERIDINE HCL 50 MG/ML IJ SOLN: 6.2500 mg | INTRAMUSCULAR | Status: DC | PRN

## 2017-12-26 MED ORDER — CEFAZOLIN SODIUM-DEXTROSE 2-4 GM/100ML-% IV SOLN
INTRAVENOUS | Status: AC
  Filled 2017-12-26: qty 100

## 2017-12-26 MED ORDER — ACETAMINOPHEN 650 MG RE SUPP: 650.0000 mg | RECTAL | Status: DC | PRN

## 2017-12-26 MED ORDER — ROCURONIUM BROMIDE 10 MG/ML (PF) SYRINGE
PREFILLED_SYRINGE | INTRAVENOUS | Status: AC
  Filled 2017-12-26: qty 10

## 2017-12-26 MED ORDER — SUGAMMADEX SODIUM 200 MG/2ML IV SOLN
INTRAVENOUS | Status: AC
  Filled 2017-12-26: qty 2

## 2017-12-26 MED ORDER — ONDANSETRON HCL 4 MG/2ML IJ SOLN
INTRAMUSCULAR | Status: DC | PRN
  Administered 2017-12-26: 4 mg via INTRAVENOUS

## 2017-12-26 MED ORDER — 0.9 % SODIUM CHLORIDE (POUR BTL) OPTIME
TOPICAL | Status: DC | PRN
  Administered 2017-12-26: 1000 mL

## 2017-12-26 MED ORDER — CHLORHEXIDINE GLUCONATE CLOTH 2 % EX PADS: 6.0000 | MEDICATED_PAD | Freq: Once | CUTANEOUS | Status: DC

## 2017-12-26 MED ORDER — FENTANYL CITRATE (PF) 100 MCG/2ML IJ SOLN
INTRAMUSCULAR | Status: AC
  Filled 2017-12-26: qty 2

## 2017-12-26 MED ORDER — LACTATED RINGERS IV SOLN
INTRAVENOUS | Status: DC | PRN
  Administered 2017-12-26: 07:00:00 via INTRAVENOUS

## 2017-12-26 MED ORDER — OXYCODONE HCL 5 MG PO TABS: 5.0000 mg | ORAL_TABLET | Freq: Four times a day (QID) | ORAL | 0 refills | Status: DC | PRN

## 2017-12-26 MED ORDER — DEXAMETHASONE SODIUM PHOSPHATE 10 MG/ML IJ SOLN
INTRAMUSCULAR | Status: AC
  Filled 2017-12-26: qty 1

## 2017-12-26 MED ORDER — MIDAZOLAM HCL 2 MG/2ML IJ SOLN
INTRAMUSCULAR | Status: DC | PRN
  Administered 2017-12-26 (×2): 1 mg via INTRAVENOUS

## 2017-12-26 MED ORDER — PROPOFOL 10 MG/ML IV BOLUS
INTRAVENOUS | Status: AC
  Filled 2017-12-26: qty 20

## 2017-12-26 MED ORDER — ACETAMINOPHEN 325 MG PO TABS: 650.0000 mg | ORAL_TABLET | ORAL | Status: DC | PRN

## 2017-12-26 MED ORDER — ROCURONIUM BROMIDE 10 MG/ML (PF) SYRINGE
PREFILLED_SYRINGE | INTRAVENOUS | Status: DC | PRN
  Administered 2017-12-26: 50 mg via INTRAVENOUS

## 2017-12-26 MED ORDER — EPHEDRINE 5 MG/ML INJ
INTRAVENOUS | Status: AC
  Filled 2017-12-26: qty 10

## 2017-12-26 MED ORDER — METOCLOPRAMIDE HCL 5 MG/ML IJ SOLN: 10.0000 mg | Freq: Once | INTRAMUSCULAR | Status: DC | PRN

## 2017-12-26 MED ORDER — SODIUM CHLORIDE 0.9% FLUSH: 3.0000 mL | INTRAVENOUS | Status: DC | PRN

## 2017-12-26 MED ORDER — DEXAMETHASONE SODIUM PHOSPHATE 10 MG/ML IJ SOLN
INTRAMUSCULAR | Status: DC | PRN
  Administered 2017-12-26: 10 mg via INTRAVENOUS

## 2017-12-26 MED ORDER — FENTANYL CITRATE (PF) 250 MCG/5ML IJ SOLN
INTRAMUSCULAR | Status: DC | PRN
  Administered 2017-12-26: 50 ug via INTRAVENOUS
  Administered 2017-12-26: 100 ug via INTRAVENOUS

## 2017-12-26 MED ORDER — FENTANYL CITRATE (PF) 250 MCG/5ML IJ SOLN
INTRAMUSCULAR | Status: AC
  Filled 2017-12-26: qty 5

## 2017-12-26 MED ORDER — BUPIVACAINE-EPINEPHRINE 0.5% -1:200000 IJ SOLN
INTRAMUSCULAR | Status: DC | PRN
  Administered 2017-12-26: 30 mL

## 2017-12-26 MED ORDER — PROPOFOL 10 MG/ML IV BOLUS
INTRAVENOUS | Status: DC | PRN
  Administered 2017-12-26: 150 mg via INTRAVENOUS

## 2017-12-26 MED ORDER — ONDANSETRON HCL 4 MG/2ML IJ SOLN
INTRAMUSCULAR | Status: AC
  Filled 2017-12-26: qty 2

## 2017-12-26 MED ORDER — FENTANYL CITRATE (PF) 100 MCG/2ML IJ SOLN
25.0000 ug | INTRAMUSCULAR | Status: DC | PRN
  Administered 2017-12-26: 25 ug via INTRAVENOUS

## 2017-12-26 MED ORDER — MORPHINE SULFATE (PF) 2 MG/ML IV SOLN
1.0000 mg | INTRAVENOUS | Status: DC | PRN
Start: 2017-12-26 — End: 2017-12-26

## 2017-12-26 MED ORDER — SODIUM CHLORIDE 0.9% FLUSH: 3.0000 mL | Freq: Two times a day (BID) | INTRAVENOUS | Status: DC

## 2017-12-26 MED ORDER — SUGAMMADEX SODIUM 200 MG/2ML IV SOLN
INTRAVENOUS | Status: DC | PRN
  Administered 2017-12-26: 250 mg via INTRAVENOUS

## 2017-12-26 MED ORDER — CEFAZOLIN SODIUM-DEXTROSE 2-3 GM-%(50ML) IV SOLR
INTRAVENOUS | Status: DC | PRN
  Administered 2017-12-26: 2 g via INTRAVENOUS

## 2017-12-26 MED ORDER — OXYCODONE HCL 5 MG PO TABS
5.0000 mg | ORAL_TABLET | ORAL | Status: DC | PRN
  Administered 2017-12-26: 5 mg via ORAL

## 2017-12-26 MED ORDER — SODIUM CHLORIDE 0.9 % IV SOLN: 250.0000 mL | INTRAVENOUS | Status: DC | PRN

## 2017-12-26 MED ORDER — PHENYLEPHRINE 40 MCG/ML (10ML) SYRINGE FOR IV PUSH (FOR BLOOD PRESSURE SUPPORT)
PREFILLED_SYRINGE | INTRAVENOUS | Status: AC
  Filled 2017-12-26: qty 10

## 2017-12-26 MED ORDER — LIDOCAINE 2% (20 MG/ML) 5 ML SYRINGE
INTRAMUSCULAR | Status: AC
  Filled 2017-12-26: qty 5

## 2017-12-26 MED ORDER — LACTATED RINGERS IV SOLN: INTRAVENOUS | Status: DC

## 2017-12-26 MED ORDER — MIDAZOLAM HCL 2 MG/2ML IJ SOLN
INTRAMUSCULAR | Status: AC
  Filled 2017-12-26: qty 2

## 2017-12-26 MED ORDER — OXYCODONE HCL 5 MG PO TABS
ORAL_TABLET | ORAL | Status: AC
  Filled 2017-12-26: qty 1

## 2017-12-26 SURGICAL SUPPLY — 40 items
ADH SKN CLS APL DERMABOND .7 (GAUZE/BANDAGES/DRESSINGS) ×1
BLADE CLIPPER SURG (BLADE) ×2 IMPLANT
CANISTER SUCT 3000ML PPV (MISCELLANEOUS) ×3 IMPLANT
CHLORAPREP W/TINT 26ML (MISCELLANEOUS) ×3 IMPLANT
COVER SURGICAL LIGHT HANDLE (MISCELLANEOUS) ×3 IMPLANT
DERMABOND ADVANCED (GAUZE/BANDAGES/DRESSINGS) ×2
DERMABOND ADVANCED .7 DNX12 (GAUZE/BANDAGES/DRESSINGS) IMPLANT
DRAPE LAPAROSCOPIC ABDOMINAL (DRAPES) ×3 IMPLANT
DRAPE UTILITY XL STRL (DRAPES) ×1 IMPLANT
DRSG TEGADERM 4X4.75 (GAUZE/BANDAGES/DRESSINGS) IMPLANT
ELECT CAUTERY BLADE 6.4 (BLADE) ×3 IMPLANT
ELECT REM PT RETURN 9FT ADLT (ELECTROSURGICAL) ×3
ELECTRODE REM PT RTRN 9FT ADLT (ELECTROSURGICAL) ×1 IMPLANT
GAUZE SPONGE 4X4 12PLY STRL (GAUZE/BANDAGES/DRESSINGS) IMPLANT
GLOVE BIOGEL PI IND STRL 6.5 (GLOVE) IMPLANT
GLOVE BIOGEL PI INDICATOR 6.5 (GLOVE) ×2
GLOVE SURG SIGNA 7.5 PF LTX (GLOVE) ×3 IMPLANT
GLOVE SURG SS PI 6.5 STRL IVOR (GLOVE) ×2 IMPLANT
GOWN STRL REUS W/ TWL LRG LVL3 (GOWN DISPOSABLE) ×1 IMPLANT
GOWN STRL REUS W/ TWL XL LVL3 (GOWN DISPOSABLE) ×1 IMPLANT
GOWN STRL REUS W/TWL LRG LVL3 (GOWN DISPOSABLE) ×3
GOWN STRL REUS W/TWL XL LVL3 (GOWN DISPOSABLE) ×3
KIT BASIN OR (CUSTOM PROCEDURE TRAY) ×3 IMPLANT
KIT ROOM TURNOVER OR (KITS) ×3 IMPLANT
MESH VENTRALEX ST 1-7/10 CRC S (Mesh General) ×2 IMPLANT
NDL HYPO 25GX1X1/2 BEV (NEEDLE) ×1 IMPLANT
NEEDLE HYPO 25GX1X1/2 BEV (NEEDLE) ×3 IMPLANT
NS IRRIG 1000ML POUR BTL (IV SOLUTION) ×3 IMPLANT
PACK GENERAL/GYN (CUSTOM PROCEDURE TRAY) ×3 IMPLANT
PAD ARMBOARD 7.5X6 YLW CONV (MISCELLANEOUS) ×3 IMPLANT
STAPLER VISISTAT 35W (STAPLE) IMPLANT
SUT MNCRL AB 4-0 PS2 18 (SUTURE) ×3 IMPLANT
SUT NOVA NAB DX-16 0-1 5-0 T12 (SUTURE) ×2 IMPLANT
SUT PROLENE 1 CT (SUTURE) IMPLANT
SUT VIC AB 3-0 SH 27 (SUTURE) ×3
SUT VIC AB 3-0 SH 27XBRD (SUTURE) ×1 IMPLANT
SYR CONTROL 10ML LL (SYRINGE) ×3 IMPLANT
TOWEL OR 17X24 6PK STRL BLUE (TOWEL DISPOSABLE) ×3 IMPLANT
TOWEL OR 17X26 10 PK STRL BLUE (TOWEL DISPOSABLE) ×3 IMPLANT
TRAY FOLEY W/METER SILVER 14FR (SET/KITS/TRAYS/PACK) IMPLANT

## 2017-12-26 NOTE — Op Note (Signed)
INCISIONAL HERNIA REPAIR WITH MESH, INSERTION OF MESH  Procedure Note  SEATON OAKLEY 12/26/2017   Pre-op Diagnosis: incisional hernia     Post-op Diagnosis: same  Procedure(s): INCISIONAL HERNIA REPAIR WITH MESH INSERTION OF MESH (4.3 cm round ventral patch)  Surgeon(s): Abigail Miyamoto, MD  Anesthesia: General  Staff:  Circulator: Jolinda Croak, RN Scrub Person: Gevena Cotton T  Estimated Blood Loss: Minimal               Procedure: The patient was brought to the operating room and identified as the correct patient.  He was placed supine on the operating table and general anesthesia was induced.  His abdomen was then prepped and draped in the usual sterile fashion.  I made a small incision through the patient's previous upper abdominal incision just to the side of the midline with a scalpel.  I dissected down to the hernia sac which was easily identified.  It was found to contain preperitoneal fat.  I excised the sac in its entirety.  The fascial defect was only 1 cm in size.  Brought a 4.3 cm round ventral patch onto the field.  I placed it through the fascial opening and then pulled against the peritoneum with the stay ties.  I then sewed the mesh in circumferentially with #1 Novafil pop-off sutures.  I cut the stay ties and then closed the fascia over the top of the mesh with a #1 Novafil suture.  I then anesthetized the fascia and surrounding skin with Marcaine.  Hemostasis was achieved with cautery.  I then closed the subcutaneous tissue with interrupted 3-0 Vicryl sutures and closed skin with a running 4-0 Monocryl.  Dermabond was then applied.  The patient tolerated the procedure well.  All the counts were correct at the end of the procedure.  The patient was then extubated in the operating room and taken in a stable condition to the recovery room.          Ivan Maskell A   Date: 12/26/2017  Time: 8:26 AM

## 2017-12-26 NOTE — Anesthesia Preprocedure Evaluation (Signed)
Anesthesia Evaluation  Patient identified by MRN, date of birth, ID band Patient awake    Reviewed: Allergy & Precautions, NPO status , Patient's Chart, lab work & pertinent test results  History of Anesthesia Complications (+) PONV  Airway Mallampati: II  TM Distance: >3 FB Neck ROM: Full    Dental no notable dental hx.    Pulmonary Current Smoker,    Pulmonary exam normal breath sounds clear to auscultation       Cardiovascular Normal cardiovascular exam+ dysrhythmias Atrial Fibrillation + Cardiac Defibrillator  Rhythm:Regular Rate:Normal  HOCM   Neuro/Psych negative neurological ROS  negative psych ROS   GI/Hepatic Neg liver ROS, hiatal hernia, GERD  ,  Endo/Other  negative endocrine ROS  Renal/GU negative Renal ROS  negative genitourinary   Musculoskeletal negative musculoskeletal ROS (+)   Abdominal   Peds negative pediatric ROS (+)  Hematology negative hematology ROS (+)   Anesthesia Other Findings   Reproductive/Obstetrics negative OB ROS                             Anesthesia Physical Anesthesia Plan  ASA: III  Anesthesia Plan: General   Post-op Pain Management:    Induction: Intravenous  PONV Risk Score and Plan: 3 and Ondansetron, Midazolam, Treatment may vary due to age or medical condition and Dexamethasone  Airway Management Planned: Oral ETT  Additional Equipment:   Intra-op Plan:   Post-operative Plan: Extubation in OR  Informed Consent: I have reviewed the patients History and Physical, chart, labs and discussed the procedure including the risks, benefits and alternatives for the proposed anesthesia with the patient or authorized representative who has indicated his/her understanding and acceptance.   Dental advisory given  Plan Discussed with: CRNA  Anesthesia Plan Comments:         Anesthesia Quick Evaluation

## 2017-12-26 NOTE — Anesthesia Postprocedure Evaluation (Signed)
Anesthesia Post Note  Patient: Charles Le  Procedure(s) Performed: Sherald Hess HERNIA REPAIR WITH MESH (N/A Abdomen) INSERTION OF MESH (N/A Abdomen)     Anesthesia Post Evaluation  Last Vitals:  Vitals:   12/26/17 0950 12/26/17 1016  BP: 104/67 94/70  Pulse: 70 69  Resp: 10 12  Temp: (!) 36.1 C   SpO2: 93% 94%    Last Pain:  Vitals:   12/26/17 0855  TempSrc:   PainSc: 5                  Phillips Grout

## 2017-12-26 NOTE — Discharge Instructions (Signed)
[facility]  UMBILICAL OR INGUINAL HERNIA REPAIR: POST OP INSTRUCTIONS  Always review your discharge instruction sheet given to you by the facility where your surgery was performed. IF YOU HAVE DISABILITY OR FAMILY LEAVE FORMS, YOU MUST BRING THEM TO THE OFFICE FOR PROCESSING.   DO NOT GIVE THEM TO YOUR DOCTOR.  1. A  prescription for pain medication may be given to you upon discharge.  Take your pain medication as prescribed, if needed.  If narcotic pain medicine is not needed, then you may take acetaminophen (Tylenol) or ibuprofen (Advil) as needed. 2. Take your usually prescribed medications unless otherwise directed. If you need a refill on your pain medication, please contact your pharmacy.  They will contact our office to request authorization. Prescriptions will not be filled after 5 pm or on week-ends. 3. You should follow a light diet the first 24 hours after arrival home, such as soup and crackers, etc.  Be sure to include lots of fluids daily.  Resume your normal diet the day after surgery. 4.Most patients will experience some swelling and bruising around the umbilicus or in the groin and scrotum.  Ice packs and reclining will help.  Swelling and bruising can take several days to resolve.  6. It is common to experience some constipation if taking pain medication after surgery.  Increasing fluid intake and taking a stool softener (such as Colace) will usually help or prevent this problem from occurring.  A mild laxative (Milk of Magnesia or Miralax) should be taken according to package directions if there are no bowel movements after 48 hours. 7. Unless discharge instructions indicate otherwise, you may remove your bandages 24-48 hours after surgery, and you may shower at that time.  You may have steri-strips (small skin tapes) in place directly over the incision.  These strips should be left on the skin for 7-10 days.  If your surgeon used skin glue on the  incision, you may shower in 24 hours.  The glue will flake off over the next 2-3 weeks.  Any sutures or staples will be removed at the office during your follow-up visit. 8. ACTIVITIES:  You may resume regular (light) daily activities beginning the next day--such as daily self-care, walking, climbing stairs--gradually increasing activities as tolerated.  You may have sexual intercourse when it is comfortable.  Refrain from any heavy lifting or straining until approved by your doctor.  a.You may drive when you are no longer taking prescription pain medication, you can comfortably wear a seatbelt, and you can safely maneuver your car and apply brakes. b.RETURN TO WORK:   _____________________________________________  9.You should see your doctor in the office for a follow-up appointment approximately 2-3 weeks after your surgery.  Make sure that you call for this appointment within a day or two after you arrive home to insure a convenient appointment time. 10.OTHER INSTRUCTIONS: _NO LIFTING MORE THAN 15 TO 20 POUNDS FOR 6 WEEKS OK TO SHOWER STARTING TOMORROW RESUME XARELTO TOMORROW STOOL SOFTENER FOR CONSTIPATION ________________________    _____________________________________  WHEN TO CALL YOUR DOCTOR: 1. Fever over 101.0 2. Inability to urinate 3. Nausea and/or vomiting 4. Extreme swelling or bruising 5. Continued bleeding from incision. 6. Increased pain, redness, or drainage from the incision  The clinic staff is available to answer your questions during regular business hours.  Please dont hesitate to call and ask to speak to one of the nurses for clinical concerns.  If you have a medical emergency, go to  the nearest emergency room or call 911.  A surgeon from [facility] is always on call at the hospital   [address], [address], [place], [place]  [address] ?  [address] [address], [place], [place]   [address] [phone] ? [phone] ? FAX [phone] Web site:  [email or web address] 

## 2017-12-26 NOTE — Transfer of Care (Signed)
Immediate Anesthesia Transfer of Care Note  Patient: Charles Le  Procedure(s) Performed: INCISIONAL HERNIA REPAIR WITH MESH (N/A Abdomen) INSERTION OF MESH (N/A Abdomen)  Patient Location: PACU  Anesthesia Type:General  Level of Consciousness: awake, alert , oriented and patient cooperative  Airway & Oxygen Therapy: Patient Spontanous Breathing and Patient connected to nasal cannula oxygen  Post-op Assessment: Report given to RN, Post -op Vital signs reviewed and stable and Patient moving all extremities X 4  Post vital signs: Reviewed and stable  Last Vitals:  Vitals:   12/26/17 0611 12/26/17 0833  BP: 105/68 110/66  Pulse: 70 70  Resp: 20 10  Temp:  (!) 36.1 C  SpO2: 98% 97%    Last Pain:  Vitals:   12/26/17 0611  TempSrc: Oral  PainSc:       Patients Stated Pain Goal: 2 (12/26/17 2025)  Complications: No apparent anesthesia complications

## 2017-12-26 NOTE — Progress Notes (Signed)
Patient discharged home with girlfriend and girlfriend states she is staying with him for 24 hours.

## 2017-12-26 NOTE — Anesthesia Procedure Notes (Signed)
Procedure Name: Intubation Date/Time: 12/26/2017 7:47 AM Performed by: Julieta Bellini, CRNA Pre-anesthesia Checklist: Patient identified, Emergency Drugs available, Suction available and Patient being monitored Patient Re-evaluated:Patient Re-evaluated prior to induction Oxygen Delivery Method: Circle system utilized Preoxygenation: Pre-oxygenation with 100% oxygen Induction Type: IV induction Ventilation: Mask ventilation without difficulty and Oral airway inserted - appropriate to patient size Laryngoscope Size: Mac and 4 Grade View: Grade I Tube type: Oral Tube size: 7.5 mm Number of attempts: 1 Airway Equipment and Method: Stylet Placement Confirmation: ETT inserted through vocal cords under direct vision,  positive ETCO2 and breath sounds checked- equal and bilateral Secured at: 23 cm Tube secured with: Tape Dental Injury: Teeth and Oropharynx as per pre-operative assessment

## 2017-12-26 NOTE — Progress Notes (Signed)
Burns [place] Judes here and PAcemaker , defib back on and interegated

## 2017-12-26 NOTE — Interval H&P Note (Signed)
History and Physical Interval Note:no change in H and P  12/26/2017 7:06 AM  Charles Le  has presented today for surgery, with the diagnosis of incisional hernia  The various methods of treatment have been discussed with the patient and family. After consideration of risks, benefits and other options for treatment, the patient has consented to  Procedure(s): INCISIONAL HERNIA REPAIR WITH MESH (N/A) INSERTION OF MESH (N/A) as a surgical intervention .  The patient's history has been reviewed, patient examined, no change in status, stable for surgery.  I have reviewed the patient's chart and labs.  Questions were answered to the patient's satisfaction.     Ramina Hulet A

## 2017-12-27 ENCOUNTER — Encounter (HOSPITAL_COMMUNITY): Payer: Self-pay | Admitting: Surgery

## 2018-01-10 ENCOUNTER — Other Ambulatory Visit: Payer: Self-pay | Admitting: Internal Medicine

## 2018-01-16 ENCOUNTER — Encounter (HOSPITAL_COMMUNITY): Payer: Self-pay | Admitting: Surgery

## 2018-03-01 ENCOUNTER — Ambulatory Visit (INDEPENDENT_AMBULATORY_CARE_PROVIDER_SITE_OTHER): Payer: Medicaid Other | Admitting: *Deleted

## 2018-03-01 ENCOUNTER — Encounter (INDEPENDENT_AMBULATORY_CARE_PROVIDER_SITE_OTHER): Payer: Self-pay

## 2018-03-01 DIAGNOSIS — I421 Obstructive hypertrophic cardiomyopathy: Secondary | ICD-10-CM | POA: Diagnosis not present

## 2018-03-01 DIAGNOSIS — I442 Atrioventricular block, complete: Secondary | ICD-10-CM | POA: Diagnosis not present

## 2018-03-01 NOTE — Progress Notes (Signed)
ICD check in clinic. Normal device function. Threshold and sensing consistent with previous device measurements. Impedance trends stable over time. No evidence of any ventricular arrhythmias Histogram distribution appropriate for patient and level of activity. No changes made this session. Device programmed at appropriate safety margins. Device programmed to optimize intrinsic conduction. Estimated longevity 2.2 years. ROV w/ DC 05/31/2018 for device check.

## 2018-03-03 ENCOUNTER — Other Ambulatory Visit: Payer: Self-pay | Admitting: Internal Medicine

## 2018-05-16 ENCOUNTER — Other Ambulatory Visit: Payer: Self-pay

## 2018-05-16 ENCOUNTER — Ambulatory Visit: Payer: Medicaid Other | Attending: Nurse Practitioner

## 2018-05-16 DIAGNOSIS — M542 Cervicalgia: Secondary | ICD-10-CM | POA: Insufficient documentation

## 2018-05-16 DIAGNOSIS — M62830 Muscle spasm of back: Secondary | ICD-10-CM | POA: Insufficient documentation

## 2018-05-16 DIAGNOSIS — M545 Low back pain, unspecified: Secondary | ICD-10-CM
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
## 2018-05-16 NOTE — Patient Instructions (Signed)
QL stretch  Knee to chest,  LTR all 3x/day 2-3 reps RT/LT 10-30 sec stretch , PPT x 10

## 2018-05-17 ENCOUNTER — Telehealth: Payer: Self-pay | Admitting: Cardiology

## 2018-05-17 NOTE — Telephone Encounter (Signed)
Spoke w/ pt girlfriend and informed pt will not need to come to the 05-31-18 appt. We will check his device from home. Pt girlfriend verbalized understanding.

## 2018-05-29 ENCOUNTER — Ambulatory Visit: Payer: Medicaid Other | Admitting: Physical Therapy

## 2018-05-29 ENCOUNTER — Encounter: Payer: Self-pay | Admitting: Physical Therapy

## 2018-05-29 DIAGNOSIS — M545 Low back pain, unspecified: Secondary | ICD-10-CM

## 2018-05-29 DIAGNOSIS — M62830 Muscle spasm of back: Secondary | ICD-10-CM

## 2018-05-29 NOTE — Patient Instructions (Addendum)
Bridge    Lie back, legs bent. Inhale, pressing hips up. Keeping ribs in, lengthen lower back. Exhale, rolling down along spine from top. Repeat __10-20__ times. Do _2___ sessions per day.  Marching In-Place    Tighten abdominals. Standing straight, alternate bringing knees toward trunk. Hold counter top. Avoid pain.  Do 10 reps each. Do __2_ sets. Do ___ times per day.  Copyright  VHI. All rights reserved.

## 2018-05-29 NOTE — Therapy (Signed)
[facility] [address] [place], [place], [address] Phone: [phone]   Fax:  [phone]  Physical Therapy Treatment  Patient Details  Name: Charles Le MRN: [id number] Date of Birth: [birth year] Referring Provider: Zoe Lan, FNP   Encounter Date: 05/29/2018  PT End of Session - 05/29/18 1111    Visit Number  2    Number of Visits  12    Date for PT Re-Evaluation  06/30/18    Authorization Type  MDC    Authorization Time Period  05/23/2018-06/08/2018    Authorization - Visit Number  1    Authorization - Number of Visits  3    PT Start Time  1105    PT Stop Time  1200    PT Time Calculation (min)  55 min       Past Medical History:  Diagnosis Date  . Aflutter    atypical-left sided  . Anxiety    Xanax for panic attacks  . Arthritis    OA AND PAIN RT HIP;  S/P LEFT TOTAL HIP REPLACEMENT  . Atrioventricular block, complete (HCC)    complete  . Automatic implantable cardioverter-defibrillator in situ    GENERATOR CHANGE 2014  . DDD-ICD    [facility] [address] [phone] ) dual chamber ICD implanted May 24, 2006)  . GERD (gastroesophageal reflux disease)   . H/O hiatal hernia   . Heart murmur    since childhood  . Hyperlipidemia   . Hypertr obst cardiomyop    hypertrophic  . Insomnia   . PONV (postoperative nausea and vomiting)    A LITTLE NAUSEA AFTER PREVIOUS HIP REPLACEMENT    Past Surgical History:  Procedure Laterality Date  . CARDIOVERSION  2006  . COLONOSCOPY WITH PROPOFOL N/A 12/08/2016   Procedure: COLONOSCOPY WITH PROPOFOL;  Surgeon: Willis Modena, MD;  Location: [facility]OS[facility];  Service: Endoscopy;  Laterality: N/A;  . HERNIA REPAIR     VENTRAL HERNIA REPAIR 11/22/11  . HIP SURGERY     replacement bilateral  . ICD implantation     ICD- St Jude  . IMPLANTABLE CARDIOVERTER DEFIBRILLATOR (ICD) GENERATOR CHANGE N/A 02/21/2013   Procedure: ICD GENERATOR CHANGE;  Surgeon: Duke Salvia, MD;   Location: [facility] CATH LAB;  Service: Cardiovascular;  Laterality: N/A;  . INCISIONAL HERNIA REPAIR N/A 12/26/2017   Procedure: INCISIONAL HERNIA REPAIR WITH MESH;  Surgeon: Abigail Miyamoto, MD;  [facility] OR;  Service: General;  Laterality: N/A;  . INSERTION OF MESH N/A 12/26/2017   Procedure: INSERTION OF MESH;  Surgeon: Abigail Miyamoto, MD;  Location: [facility] OR;  Service: General;  Laterality: N/A;  . JOINT REPLACEMENT  Oct 07, 2009   LEFT TOTAL HIP ARTHROPLASTY  . TOTAL HIP ARTHROPLASTY Right 12/18/2013   Procedure: RIGHT TOTAL HIP ARTHROPLASTY ANTERIOR APPROACH;  Surgeon: Shelda Pal, MD;  Location: [facility] ORS;  Service: Orthopedics;  Laterality: Right;  . VENTRAL HERNIA REPAIR  11/22/2011   Procedure: HERNIA REPAIR VENTRAL ADULT;  Surgeon: Shelly Rubenstein, MD;  Location: [facility] OR;  Service: General;  Laterality: N/A;  Ventral hernia repair with mesh    There were no vitals filed for this visit.  Subjective Assessment - 05/29/18 1108    Subjective  1/10 back. Hips are 4/10. Wrist 94% improved.     Currently in Pain?  Yes    Pain Score  1     Pain Location  Back    Pain Orientation  Right;Left;Lower  Pain Descriptors / Indicators  -- stiffness    Aggravating Factors   bending    Pain Relieving Factors  meds                        [facility] Adult PT Treatment/Exercise - 05/29/18 0001      Lumbar Exercises: Stretches   Single Knee to Chest Stretch  3 reps;30 seconds    Lower Trunk Rotation  10 seconds 10 reps    Hip Flexor Stretch  1 rep;60 seconds;Right    Other Lumbar Stretch Exercise  side lying QL stretch over pillow roll x 1 min bilateral       Lumbar Exercises: Standing   Other Standing Lumbar Exercises  hip flexion with abdominal draw in, marching       Lumbar Exercises: Supine   Pelvic Tilt  20 reps    Bridge  10 reps      Moist Heat Therapy   Number Minutes Moist Heat  15 Minutes    Moist Heat Location  Lumbar Spine      Manual Therapy   Soft tissue  mobilization  massage roller to right thigh               PT Short Term Goals - 05/16/18 1741      PT SHORT TERM GOAL #1   Title  He will report pain decr 20% or more in lower back generally    Time  2    Period  Weeks    Status  New      PT SHORT TERM GOAL #2   Title  He will be independent with initial HEP     Baseline  No program of exercise for lower back papin.     Time  2    Period  Weeks    Status  New        PT Long Term Goals - 05/16/18 1744      PT LONG TERM GOAL #1   Title  He will be independent with all HEP issued    Baseline  he will be independent with initial HEP    Time  6    Period  Weeks    Status  New      PT LONG TERM GOAL #2   Title  He will have intermittant LBP with normal activity without medication    Baseline  Pain constant unless he uses pain meds    Time  6    Period  Weeks    Status  New      PT LONG TERM GOAL #3   Title  He will report able to bend to tie shoes without pain    Baseline  pain with tying shoes    Time  6    Period  Weeks    Status  New      PT LONG TERM GOAL #4   Title  He will report no pain with basic home tasks    Baseline  He has home tasks and yard tasks done by others now due to pain    Time  6    Period  Weeks    Status  New      PT LONG TERM GOAL #5   Title  He will return to some basic yard tasks with 2-3 max pain    Baseline  not doing yard work    Time  6    Period  Weeks    Status  New      Additional Long Term Goals   Additional Long Term Goals  Yes      PT LONG TERM GOAL #6   Title  He will be able to walk as needed at home and community distances 45-60 min no pain    Baseline  walking in home mostly and max 45 min distances    Time  6    Period  Weeks    Status  New            Plan - 05/29/18 1250    Clinical Impression Statement  Pt reports back pain a little better. Wrist continues to improve. He tried a couple of exercises. Most time spent with current HEP technique.  Pain and weakness at right hip flexion. Massage roller to right thigh decreased tension. Added bridge and standing march for HEP.     PT Next Visit Plan  heat and manual, progress HEP, modalites    PT Home Exercise Plan  PPT ,QL stretch , SKC, LTR, bridge, standing march     Consulted and Agree with Plan of Care  Patient       Patient will benefit from skilled therapeutic intervention in order to improve the following deficits and impairments:  Pain, Increased muscle spasms, Postural dysfunction, Decreased activity tolerance, Decreased range of motion, Decreased strength, Difficulty walking  Visit Diagnosis: Bilateral low back pain without sciatica, unspecified chronicity  Muscle spasm of back     Problem List Patient Active Problem List   Diagnosis Date Noted  . S/P right THA, AA 12/18/2013  . History of total replacement of right hip 12/18/2013  . Hypertension 02/10/2012  . Ventral hernia 08/24/2011  . Abdominal wall mass of left upper quadrant 08/16/2011  . Right shoulder pain 03/10/2011  . ICD-DDD st judes  RIATA 01/27/2011  . RiATA  lead 01/27/2011  . Permanent atrial fibrillation (HCC)   . INCREASED BLOOD PRESSURE 09/30/2009  . AV BLOCK, COMPLETE 03/04/2009  . Hypertrophic obstructive cardiomyopathy(425.11) 12/11/2008    Sherrie Mustache, PTA 05/29/2018, 12:56 PM  [facility] [address] [place], [place], [address] Phone: [phone]   Fax:  [phone]  Name: TORIEN RAMROOP MRN: [id number] Date of Birth: [birth year]

## 2018-05-31 ENCOUNTER — Ambulatory Visit (INDEPENDENT_AMBULATORY_CARE_PROVIDER_SITE_OTHER): Payer: Medicaid Other | Admitting: *Deleted

## 2018-05-31 ENCOUNTER — Ambulatory Visit: Payer: Medicaid Other | Admitting: Physical Therapy

## 2018-05-31 ENCOUNTER — Encounter: Payer: Self-pay | Admitting: Cardiology

## 2018-05-31 ENCOUNTER — Encounter: Payer: Self-pay | Admitting: Physical Therapy

## 2018-05-31 DIAGNOSIS — I442 Atrioventricular block, complete: Secondary | ICD-10-CM | POA: Diagnosis not present

## 2018-05-31 DIAGNOSIS — M545 Low back pain, unspecified: Secondary | ICD-10-CM

## 2018-05-31 DIAGNOSIS — I422 Other hypertrophic cardiomyopathy: Secondary | ICD-10-CM

## 2018-05-31 DIAGNOSIS — M62830 Muscle spasm of back: Secondary | ICD-10-CM

## 2018-05-31 NOTE — Progress Notes (Signed)
Remote ICD transmission.   
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
## 2018-07-13 LAB — CUP PACEART REMOTE DEVICE CHECK
Battery Remaining Longevity: 20 mo
Battery Remaining Percentage: 24 %
Brady Statistic RV Percent Paced: 98 %
HIGH POWER IMPEDANCE MEASURED VALUE: 55 Ohm
Implantable Lead Implant Date: 20070724
Implantable Lead Location: 753860
Implantable Lead Model: 5076
Lead Channel Sensing Intrinsic Amplitude: 12 mV
Lead Channel Setting Sensing Sensitivity: 2 mV
MDC IDC LEAD IMPLANT DT: 20070724
MDC IDC LEAD LOCATION: 753859
MDC IDC MSMT LEADCHNL RV IMPEDANCE VALUE: 390 Ohm
MDC IDC PG IMPLANT DT: 20140423
MDC IDC SESS DTM: 20190912093802
MDC IDC SET LEADCHNL RV PACING AMPLITUDE: 2.5 V
MDC IDC SET LEADCHNL RV PACING PULSEWIDTH: 0.5 ms
Pulse Gen Serial Number: 1102557

## 2018-08-30 ENCOUNTER — Ambulatory Visit (INDEPENDENT_AMBULATORY_CARE_PROVIDER_SITE_OTHER): Payer: Medicaid Other | Admitting: *Deleted

## 2018-08-30 DIAGNOSIS — I422 Other hypertrophic cardiomyopathy: Secondary | ICD-10-CM | POA: Diagnosis not present

## 2018-08-30 DIAGNOSIS — R55 Syncope and collapse: Secondary | ICD-10-CM

## 2018-08-30 NOTE — Progress Notes (Signed)
Remote ICD transmission.   

## 2018-09-08 ENCOUNTER — Encounter: Payer: Self-pay | Admitting: Cardiology

## 2018-10-31 LAB — CUP PACEART REMOTE DEVICE CHECK
Implantable Lead Location: 753859
Implantable Lead Location: 753860
Implantable Lead Model: 5076
Implantable Lead Model: 7001
MDC IDC LEAD IMPLANT DT: 20070724
MDC IDC LEAD IMPLANT DT: 20070724
MDC IDC PG IMPLANT DT: 20140423
MDC IDC PG SERIAL: 1102557
MDC IDC SESS DTM: 20191231092217

## 2018-11-29 ENCOUNTER — Ambulatory Visit (INDEPENDENT_AMBULATORY_CARE_PROVIDER_SITE_OTHER): Payer: Medicaid Other

## 2018-11-29 DIAGNOSIS — I421 Obstructive hypertrophic cardiomyopathy: Secondary | ICD-10-CM

## 2018-11-29 DIAGNOSIS — R55 Syncope and collapse: Secondary | ICD-10-CM | POA: Diagnosis not present

## 2018-11-30 NOTE — Progress Notes (Signed)
Remote ICD transmission.   

## 2018-12-01 LAB — CUP PACEART REMOTE DEVICE CHECK
Date Time Interrogation Session: 20200131074648
Implantable Lead Implant Date: 20070724
Implantable Lead Implant Date: 20070724
Implantable Lead Location: 753859
Implantable Lead Model: 5076
Implantable Pulse Generator Implant Date: 20140423
MDC IDC LEAD LOCATION: 753860
MDC IDC PG SERIAL: 1102557

## 2019-02-01 ENCOUNTER — Telehealth: Payer: Self-pay | Admitting: Pharmacist

## 2019-02-01 ENCOUNTER — Other Ambulatory Visit: Payer: Self-pay

## 2019-02-01 MED ORDER — RIVAROXABAN 20 MG PO TABS: 20.0000 mg | ORAL_TABLET | Freq: Every day | ORAL | 5 refills | Status: AC

## 2019-02-01 NOTE — Telephone Encounter (Signed)
[facility] called requesting refill on patient Xarelto. Patient is overdue for office visit and for labs (both just over 63 year old). Due to current pandemic, made patient aware that he will need labs and office visit, but I have given him a 3 month supply of Xarelto until he can accomplish this. Verbal order was given to the pharmacist

## 2019-02-28 ENCOUNTER — Other Ambulatory Visit: Payer: Self-pay

## 2019-02-28 ENCOUNTER — Ambulatory Visit (INDEPENDENT_AMBULATORY_CARE_PROVIDER_SITE_OTHER): Payer: Medicaid Other | Admitting: *Deleted

## 2019-02-28 DIAGNOSIS — R55 Syncope and collapse: Secondary | ICD-10-CM

## 2019-02-28 DIAGNOSIS — I422 Other hypertrophic cardiomyopathy: Secondary | ICD-10-CM

## 2019-02-28 LAB — CUP PACEART REMOTE DEVICE CHECK
Date Time Interrogation Session: 20200429160916
Implantable Lead Implant Date: 20070724
Implantable Lead Implant Date: 20070724
Implantable Lead Location: 753859
Implantable Lead Location: 753860
Implantable Lead Model: 5076
Implantable Lead Model: 7001
Implantable Pulse Generator Implant Date: 20140423
Pulse Gen Serial Number: 1102557

## 2019-03-09 NOTE — Progress Notes (Signed)
Remote ICD transmission.   

## 2019-05-30 ENCOUNTER — Ambulatory Visit (INDEPENDENT_AMBULATORY_CARE_PROVIDER_SITE_OTHER): Payer: Medicaid Other | Admitting: *Deleted

## 2019-05-30 DIAGNOSIS — I4821 Permanent atrial fibrillation: Secondary | ICD-10-CM

## 2019-05-30 DIAGNOSIS — I442 Atrioventricular block, complete: Secondary | ICD-10-CM

## 2019-05-30 LAB — CUP PACEART REMOTE DEVICE CHECK
Date Time Interrogation Session: 20200729181300
Implantable Lead Implant Date: 20070724
Implantable Lead Implant Date: 20070724
Implantable Lead Location: 753859
Implantable Lead Location: 753860
Implantable Lead Model: 5076
Implantable Lead Model: 7001
Implantable Pulse Generator Implant Date: 20140423
Pulse Gen Serial Number: 1102557

## 2019-06-07 NOTE — Progress Notes (Signed)
Remote ICD transmission.   

## 2019-06-18 ENCOUNTER — Telehealth: Payer: Self-pay

## 2019-06-18 NOTE — Telephone Encounter (Signed)
Spoke w/ pt regarding alert for ERI. Scheduled pt for monthly battery checks. All questions answered.

## 2019-07-19 ENCOUNTER — Ambulatory Visit (INDEPENDENT_AMBULATORY_CARE_PROVIDER_SITE_OTHER): Payer: Medicaid Other | Admitting: *Deleted

## 2019-07-19 DIAGNOSIS — I4821 Permanent atrial fibrillation: Secondary | ICD-10-CM

## 2019-07-19 LAB — CUP PACEART REMOTE DEVICE CHECK
Date Time Interrogation Session: 20200917155314
Implantable Lead Implant Date: 20070724
Implantable Lead Implant Date: 20070724
Implantable Lead Location: 753859
Implantable Lead Location: 753860
Implantable Lead Model: 5076
Implantable Lead Model: 7001
Implantable Pulse Generator Implant Date: 20140423
Pulse Gen Serial Number: 1102557

## 2019-07-23 ENCOUNTER — Encounter: Payer: Self-pay | Admitting: Cardiology

## 2019-07-23 NOTE — Progress Notes (Signed)
Remote ICD transmission.   

## 2019-08-17 ENCOUNTER — Telehealth: Payer: Self-pay

## 2019-08-17 NOTE — Telephone Encounter (Signed)
Pt device met ERI as of this am.     He has had multiple gen changes so is familiar with the process.   OK for virtual to discuss gen change? Or needs in person?   Since uncomplicated case, if needs in person could also likely be  with me to discuss the procedure.   Thanks,  [employer], PA-C  08/17/2019 1:10 PM

## 2019-08-17 NOTE — Telephone Encounter (Signed)
Pt ok for virtual visit or visit with APP to discuss change out.

## 2019-08-17 NOTE — Telephone Encounter (Signed)
Pt can be scheduled with virtual visit with Dr. Caryl Comes if available within the next few weeks to discuss gen change.  If unavailable, I will be glad to see him in the office to discuss and get him scheduled.   Thank you!  Legrand Como [address] [place], PA-C  08/17/2019 3:38 PM
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
## 2019-08-20 ENCOUNTER — Telehealth: Payer: Self-pay | Admitting: Emergency Medicine

## 2019-08-20 NOTE — Telephone Encounter (Signed)
Confirmed appointment on 08/24/19 with [place] PA to discuss ICD generator change.

## 2019-08-24 ENCOUNTER — Telehealth: Payer: Self-pay | Admitting: Student

## 2019-08-24 ENCOUNTER — Encounter: Payer: Medicaid Other | Admitting: Student

## 2019-08-24 NOTE — Telephone Encounter (Signed)
New Message    Patient had appointment for today but he has a fever so it has been changed to 09/10/19.  Patient would like a nurse to call them back to make sure the switch is ok.

## 2019-08-30 ENCOUNTER — Encounter: Payer: Medicaid Other | Admitting: *Deleted

## 2019-09-10 ENCOUNTER — Ambulatory Visit (INDEPENDENT_AMBULATORY_CARE_PROVIDER_SITE_OTHER): Payer: Medicaid Other | Admitting: Student

## 2019-09-10 ENCOUNTER — Encounter: Payer: Self-pay | Admitting: *Deleted

## 2019-09-10 ENCOUNTER — Other Ambulatory Visit: Payer: Self-pay

## 2019-09-10 VITALS — BP 122/68 | HR 86 | Ht 71.0 in | Wt 263.0 lb

## 2019-09-10 DIAGNOSIS — I442 Atrioventricular block, complete: Secondary | ICD-10-CM

## 2019-09-10 DIAGNOSIS — I4821 Permanent atrial fibrillation: Secondary | ICD-10-CM

## 2019-09-10 NOTE — Patient Instructions (Addendum)
Medication Instructions:   Your physician recommends that you continue on your current medications as directed. Please refer to the Current Medication list given to you today.  *If you need a refill on your cardiac medications before your next appointment, please call your pharmacy*  Lab Work:   [place]   If you have labs (blood work) drawn today and your tests are completely normal, you will receive your results only by: [name] MyChart Message (if you have MyChart) OR . A paper copy in the mail If you have any lab test that is abnormal or we need to change your treatment, we will call you to review the results.  Testing/Procedures: SEE LETTER FOR  GEN CHANGE OUT  09-19-19   Follow-Up: At [facility], you and your health needs are our priority.  As part of our continuing mission to provide you with exceptional heart care, we have created designated Provider Care Teams.  These Care Teams include your primary Cardiologist (physician) and Advanced Practice Providers (APPs -  Physician Assistants and Nurse Practitioners) who all work together to provide you with the care you need, when you need it.  Your next appointment:  AFTER 09-19-19   14 DAYS AFTER FOR WOUND CHECK BY [facility]   22 DAYS AFTER  FOR  PHYS DEFIB [place] WITH DR Caryl Comes   Other Instructions \

## 2019-09-10 NOTE — Progress Notes (Signed)
  Electrophysiology Office Note Date: 09/10/2019  ID:  Sharad M Coddington, DOB 11/09/1955, MRN [id number]  PCP: Jones, Penny L, NP Primary Cardiologist: Steven Klein, MD Electrophysiologist: Dr. klein  CC: Routine ICD follow-up  Claxton M Dedeaux is a 63 y.o. male seen today for Dr. Klein.  They present today for routine electrophysiology followup.  Since last being seen in our clinic, the patient reports doing very well overall. They deny chest pain, palpitations, dyspnea, PND, orthopnea, nausea, vomiting, dizziness, syncope, edema, weight gain, or early satiety.  He has not had ICD shocks.    Device History: St. Jude Dual Chamber ICD implanted 2007, gen change 2014 for HCM History of appropriate therapy: No History of AAD therapy: No   Past Medical History:  Diagnosis Date  . Aflutter    atypical-left sided  . Anxiety    Xanax for panic attacks  . Arthritis    OA AND PAIN RT HIP;  S/P LEFT TOTAL HIP REPLACEMENT  . Atrioventricular block, complete (HCC)    complete  . Automatic implantable cardioverter-defibrillator in situ    GENERATOR CHANGE 2014  . DDD-ICD    St Jude Medical Atlas DR V-243 ) dual chamber ICD implanted May 24, 2006)  . GERD (gastroesophageal reflux disease)   . H/O hiatal hernia   . Heart murmur    since childhood  . Hyperlipidemia   . Hypertr obst cardiomyop    hypertrophic  . Insomnia   . PONV (postoperative nausea and vomiting)    A LITTLE NAUSEA AFTER PREVIOUS HIP REPLACEMENT   Past Surgical History:  Procedure Laterality Date  . CARDIOVERSION  2006  . COLONOSCOPY WITH PROPOFOL N/A 12/08/2016   Procedure: COLONOSCOPY WITH PROPOFOL;  Surgeon: William Outlaw, MD;  Location: [facility];  Service: Endoscopy;  Laterality: N/A;  . HERNIA REPAIR     VENTRAL HERNIA REPAIR 11/22/11  . HIP SURGERY     replacement bilateral  . ICD implantation     ICD- St Jude  . IMPLANTABLE CARDIOVERTER DEFIBRILLATOR (ICD) GENERATOR CHANGE N/A 02/21/2013   Procedure: ICD  GENERATOR CHANGE;  Surgeon: Steven C Klein, MD;  Location: [facility];  Service: Cardiovascular;  Laterality: N/A;  . INCISIONAL HERNIA REPAIR N/A 12/26/2017   Procedure: INCISIONAL HERNIA REPAIR WITH MESH;  Surgeon: Blackman, Douglas, MD;  Location: [facility];  Service: General;  Laterality: N/A;  . INSERTION OF MESH N/A 12/26/2017   Procedure: INSERTION OF MESH;  Surgeon: Blackman, Douglas, MD;  Location: [facility];  Service: General;  Laterality: N/A;  . JOINT REPLACEMENT  Oct 07, 2009   LEFT TOTAL HIP ARTHROPLASTY  . TOTAL HIP ARTHROPLASTY Right 12/18/2013   Procedure: RIGHT TOTAL HIP ARTHROPLASTY ANTERIOR APPROACH;  Surgeon: Matthew D Olin, MD;  Location: [facility];  Service: Orthopedics;  Laterality: Right;  . VENTRAL HERNIA REPAIR  11/22/2011   Procedure: HERNIA REPAIR VENTRAL ADULT;  Surgeon: Douglas A Blackman, MD;  Location: [facility];  Service: General;  Laterality: N/A;  Ventral hernia repair with mesh    Current Outpatient Medications  Medication Sig Dispense Refill  . ALPRAZolam (XANAX) 0.5 MG tablet Take 0.5 mg by mouth 3 (three) times daily as needed for anxiety.     . atorvastatin (LIPITOR) 40 MG tablet Take 40 mg by mouth daily.    . chlorhexidine (PERIDEX) 0.12 % solution 15 mLs by Mouth Rinse route 3 (three) times daily. 2-3 times daily after brush.  3  . dexlansoprazole (DEXILANT) 60 MG capsule Take 60 mg by   mouth daily.     . lisinopril-hydrochlorothiazide (PRINZIDE,ZESTORETIC) 20-12.5 MG tablet TAKE 1 TABLET BY MOUTH DAILY. 90 tablet 2  . Multiple Vitamin (MULTIVITAMIN WITH MINERALS) TABS Take 1 tablet by mouth daily.    . Omega-3 Fatty Acids (OMEGA-3 FISH OIL) 1200 MG CAPS Take 1 capsule by mouth daily.    . rivaroxaban (XARELTO) 20 MG TABS tablet Take 1 tablet (20 mg total) by mouth daily with supper. 30 tablet 5  . Tetrahydrozoline HCl (VISINE OP) Place 1 drop into both eyes 3 (three) times daily as needed (dry eyes).     . traZODone (DESYREL) 50 MG tablet Take 50 mg by mouth at  bedtime as needed for sleep.      No current facility-administered medications for this visit.     Allergies:   Patient has no known allergies.   Social History: Social History   Socioeconomic History  . Marital status: Single    Spouse name: Not on file  . Number of children: Not on file  . Years of education: Not on file  . Highest education level: Not on file  Occupational History  . Not on file  Social Needs  . Financial resource strain: Not on file  . Food insecurity    Worry: Not on file    Inability: Not on file  . Transportation needs    Medical: Not on file    Non-medical: Not on file  Tobacco Use  . Smoking status: Current Some Day Smoker    Packs/day: 0.50    Years: 18.00    Pack years: 9.00    Types: Cigarettes  . Smokeless tobacco: Never Used  . Tobacco comment: 2-3 a day  Substance and Sexual Activity  . Alcohol use: Yes    Alcohol/week: 0.0 standard drinks    Comment: 1 - 2 times per week, on Fridays  . Drug use: No  . Sexual activity: Not on file  Lifestyle  . Physical activity    Days per week: Not on file    Minutes per session: Not on file  . Stress: Not on file  Relationships  . Social connections    Talks on phone: Not on file    Gets together: Not on file    Attends religious service: Not on file    Active member of club or organization: Not on file    Attends meetings of clubs or organizations: Not on file    Relationship status: Not on file  . Intimate partner violence    Fear of current or ex partner: Not on file    Emotionally abused: Not on file    Physically abused: Not on file    Forced sexual activity: Not on file  Other Topics Concern  . Not on file  Social History Narrative  . Not on file    Family History: Family History  Problem Relation Age of Onset  . Heart attack Father   . Diabetes Father   . Cancer Mother        breast  . Cancer Maternal Grandfather        prostate  . Hypertrophic cardiomyopathy Paternal  Grandmother   . Diabetes Paternal Grandmother   . Hypertension Neg Hx     Review of Systems: All other systems reviewed and are otherwise negative except as noted above.   Physical Exam: Vitals:   09/10/19 1329  BP: 122/68  Pulse: (!) 47  SpO2: 97%  Weight: 263 lb (119.3 kg)  Height:   5' 11" (1.803 m)     GEN- The patient is well appearing, alert and oriented x 3 today.   HEENT: normocephalic, atraumatic; sclera clear, conjunctiva pink; hearing intact; oropharynx clear; neck supple, no JVP Lymph- no cervical lymphadenopathy Lungs- Clear to ausculation bilaterally, normal work of breathing.  No wheezes, rales, rhonchi Heart- Regular rate and rhythm, no murmurs, rubs or gallops, PMI not laterally displaced GI- soft, non-tender, non-distended, bowel sounds present, no hepatosplenomegaly Extremities- no clubbing, cyanosis, or edema; DP/PT/radial pulses 2+ bilaterally MS- no significant deformity or atrophy Skin- warm and dry, no rash or lesion; ICD pocket well healed Psych- euthymic mood, full affect Neuro- strength and sensation are intact  ICD interrogation- reviewed in detail today,  See PACEART report  EKG:  EKG is not ordered today.  Recent Labs: No results found for requested labs within last 8760 hours.   Wt Readings from Last 3 Encounters:  09/10/19 263 lb (119.3 kg)  12/21/17 268 lb 8 oz (121.8 kg)  12/01/17 272 lb (123.4 kg)     Other studies Reviewed: Additional studies/ records that were reviewed today include: Echo 09/2015 which showed LVEF 50-55%   Assessment and Plan:  1.  Chronic systolic dysfunction s/p St. Jude dual chamber ICD  Euvolemic today Stable on an appropriate medical regimen Normal ICD function for device that met ERI as of 08/17/2019. Risks and benefits of generator change discussed at length today. Pt verbalized understanding and agrees to proceed pending scheduling.  See Pace Art report No changes today  2. HOCM Volume status look OK  Denies syncope.  3. Permanent Afib CHA2DS2VASC of 1, and pt is maintained on Xarelto.  Rates overall well controlled.  Current medicines are reviewed at length with the patient today.   The patient does not have concerns regarding his medicines.  The following changes were made today:  none  Labs/ tests ordered today include:  Orders Placed This Encounter  Procedures  . Basic metabolic panel  . CBC  . HEART 12-Lead   Disposition:   Follow up with Dr. Caryl Comes s/p gen change.   Jacalyn Lefevre, PA-C  09/10/2019 1:41 PM  [address] [place] [address] [phone] (office) [phone] (fax)

## 2019-09-10 NOTE — H&P (View-Only) (Signed)
  Electrophysiology Office Note Date: 09/10/2019  ID:  Charles Le, DOB 03/02/1956, MRN [id number]  PCP: Jones, Penny L, NP Primary Cardiologist: Steven Klein, MD Electrophysiologist: Dr. klein  CC: Routine ICD follow-up  Charles Le is a 63 y.o. male seen today for Dr. Klein.  They present today for routine electrophysiology followup.  Since last being seen in our clinic, the patient reports doing very well overall. They deny chest pain, palpitations, dyspnea, PND, orthopnea, nausea, vomiting, dizziness, syncope, edema, weight gain, or early satiety.  He has not had ICD shocks.    Device History: St. Jude Dual Chamber ICD implanted 2007, gen change 2014 for HCM History of appropriate therapy: No History of AAD therapy: No   Past Medical History:  Diagnosis Date  . Aflutter    atypical-left sided  . Anxiety    Xanax for panic attacks  . Arthritis    OA AND PAIN RT HIP;  S/P LEFT TOTAL HIP REPLACEMENT  . Atrioventricular block, complete (HCC)    complete  . Automatic implantable cardioverter-defibrillator in situ    GENERATOR CHANGE 2014  . DDD-ICD    St Jude Medical Atlas DR V-243 ) dual chamber ICD implanted May 24, 2006)  . GERD (gastroesophageal reflux disease)   . H/O hiatal hernia   . Heart murmur    since childhood  . Hyperlipidemia   . Hypertr obst cardiomyop    hypertrophic  . Insomnia   . PONV (postoperative nausea and vomiting)    A LITTLE NAUSEA AFTER PREVIOUS HIP REPLACEMENT   Past Surgical History:  Procedure Laterality Date  . CARDIOVERSION  2006  . COLONOSCOPY WITH PROPOFOL N/A 12/08/2016   Procedure: COLONOSCOPY WITH PROPOFOL;  Surgeon: William Outlaw, MD;  Location: [facility];  Service: Endoscopy;  Laterality: N/A;  . HERNIA REPAIR     VENTRAL HERNIA REPAIR 11/22/11  . HIP SURGERY     replacement bilateral  . ICD implantation     ICD- St Jude  . IMPLANTABLE CARDIOVERTER DEFIBRILLATOR (ICD) GENERATOR CHANGE N/A 02/21/2013   Procedure: ICD  GENERATOR CHANGE;  Surgeon: Steven C Klein, MD;  Location: [facility];  Service: Cardiovascular;  Laterality: N/A;  . INCISIONAL HERNIA REPAIR N/A 12/26/2017   Procedure: INCISIONAL HERNIA REPAIR WITH MESH;  Surgeon: Blackman, Douglas, MD;  Location: [facility];  Service: General;  Laterality: N/A;  . INSERTION OF MESH N/A 12/26/2017   Procedure: INSERTION OF MESH;  Surgeon: Blackman, Douglas, MD;  Location: [facility];  Service: General;  Laterality: N/A;  . JOINT REPLACEMENT  Oct 07, 2009   LEFT TOTAL HIP ARTHROPLASTY  . TOTAL HIP ARTHROPLASTY Right 12/18/2013   Procedure: RIGHT TOTAL HIP ARTHROPLASTY ANTERIOR APPROACH;  Surgeon: Matthew D Olin, MD;  Location: [facility];  Service: Orthopedics;  Laterality: Right;  . VENTRAL HERNIA REPAIR  11/22/2011   Procedure: HERNIA REPAIR VENTRAL ADULT;  Surgeon: Douglas A Blackman, MD;  Location: [facility];  Service: General;  Laterality: N/A;  Ventral hernia repair with mesh    Current Outpatient Medications  Medication Sig Dispense Refill  . ALPRAZolam (XANAX) 0.5 MG tablet Take 0.5 mg by mouth 3 (three) times daily as needed for anxiety.     . atorvastatin (LIPITOR) 40 MG tablet Take 40 mg by mouth daily.    . chlorhexidine (PERIDEX) 0.12 % solution 15 mLs by Mouth Rinse route 3 (three) times daily. 2-3 times daily after brush.  3  . dexlansoprazole (DEXILANT) 60 MG capsule Take 60 mg by   mouth daily.     . lisinopril-hydrochlorothiazide (PRINZIDE,ZESTORETIC) 20-12.5 MG tablet TAKE 1 TABLET BY MOUTH DAILY. 90 tablet 2  . Multiple Vitamin (MULTIVITAMIN WITH MINERALS) TABS Take 1 tablet by mouth daily.    . Omega-3 Fatty Acids (OMEGA-3 FISH OIL) 1200 MG CAPS Take 1 capsule by mouth daily.    . rivaroxaban (XARELTO) 20 MG TABS tablet Take 1 tablet (20 mg total) by mouth daily with supper. 30 tablet 5  . Tetrahydrozoline HCl (VISINE OP) Place 1 drop into both eyes 3 (three) times daily as needed (dry eyes).     . traZODone (DESYREL) 50 MG tablet Take 50 mg by mouth at  bedtime as needed for sleep.      No current facility-administered medications for this visit.     Allergies:   Patient has no known allergies.   Social History: Social History   Socioeconomic History  . Marital status: Single    Spouse name: Not on file  . Number of children: Not on file  . Years of education: Not on file  . Highest education level: Not on file  Occupational History  . Not on file  Social Needs  . Financial resource strain: Not on file  . Food insecurity    Worry: Not on file    Inability: Not on file  . Transportation needs    Medical: Not on file    Non-medical: Not on file  Tobacco Use  . Smoking status: Current Some Day Smoker    Packs/day: 0.50    Years: 18.00    Pack years: 9.00    Types: Cigarettes  . Smokeless tobacco: Never Used  . Tobacco comment: 2-3 a day  Substance and Sexual Activity  . Alcohol use: Yes    Alcohol/week: 0.0 standard drinks    Comment: 1 - 2 times per week, on Fridays  . Drug use: No  . Sexual activity: Not on file  Lifestyle  . Physical activity    Days per week: Not on file    Minutes per session: Not on file  . Stress: Not on file  Relationships  . Social connections    Talks on phone: Not on file    Gets together: Not on file    Attends religious service: Not on file    Active member of club or organization: Not on file    Attends meetings of clubs or organizations: Not on file    Relationship status: Not on file  . Intimate partner violence    Fear of current or ex partner: Not on file    Emotionally abused: Not on file    Physically abused: Not on file    Forced sexual activity: Not on file  Other Topics Concern  . Not on file  Social History Narrative  . Not on file    Family History: Family History  Problem Relation Age of Onset  . Heart attack Father   . Diabetes Father   . Cancer Mother        breast  . Cancer Maternal Grandfather        prostate  . Hypertrophic cardiomyopathy Paternal  Grandmother   . Diabetes Paternal Grandmother   . Hypertension Neg Hx     Review of Systems: All other systems reviewed and are otherwise negative except as noted above.   Physical Exam: Vitals:   09/10/19 1329  BP: 122/68  Pulse: (!) 47  SpO2: 97%  Weight: 263 lb (119.3 kg)  Height:   5' 11" (1.803 m)     GEN- The patient is well appearing, alert and oriented x 3 today.   HEENT: normocephalic, atraumatic; sclera clear, conjunctiva pink; hearing intact; oropharynx clear; neck supple, no JVP Lymph- no cervical lymphadenopathy Lungs- Clear to ausculation bilaterally, normal work of breathing.  No wheezes, rales, rhonchi Heart- Regular rate and rhythm, no murmurs, rubs or gallops, PMI not laterally displaced GI- soft, non-tender, non-distended, bowel sounds present, no hepatosplenomegaly Extremities- no clubbing, cyanosis, or edema; DP/PT/radial pulses 2+ bilaterally MS- no significant deformity or atrophy Skin- warm and dry, no rash or lesion; ICD pocket well healed Psych- euthymic mood, full affect Neuro- strength and sensation are intact  ICD interrogation- reviewed in detail today,  See PACEART report  EKG:  EKG is not ordered today.  Recent Labs: No results found for requested labs within last 8760 hours.   Wt Readings from Last 3 Encounters:  09/10/19 263 lb (119.3 kg)  12/21/17 268 lb 8 oz (121.8 kg)  12/01/17 272 lb (123.4 kg)     Other studies Reviewed: Additional studies/ records that were reviewed today include: Echo 09/2015 which showed LVEF 50-55%   Assessment and Plan:  1.  Chronic systolic dysfunction s/p St. Jude dual chamber ICD  Euvolemic today Stable on an appropriate medical regimen Normal ICD function for device that met ERI as of 08/17/2019. Risks and benefits of generator change discussed at length today. Pt verbalized understanding and agrees to proceed pending scheduling.  See Pace Art report No changes today  2. HOCM Volume status look OK  Denies syncope.  3. Permanent Afib CHA2DS2VASC of 1, and pt is maintained on Xarelto.  Rates overall well controlled.  Current medicines are reviewed at length with the patient today.   The patient does not have concerns regarding his medicines.  The following changes were made today:  none  Labs/ tests ordered today include:  Orders Placed This Encounter  Procedures  . Basic metabolic panel  . CBC  . HEART 12-Lead   Disposition:   Follow up with Dr. Caryl Comes s/p gen change.   Jacalyn Lefevre, PA-C  09/10/2019 1:41 PM  [address] [place] [address] [phone] (office) [phone] (fax)

## 2019-09-11 LAB — CUP PACEART INCLINIC DEVICE CHECK
Battery Remaining Longevity: 0 mo
Brady Statistic RA Percent Paced: 0 %
Brady Statistic RV Percent Paced: 98 %
Date Time Interrogation Session: 20201109183837
HighPow Impedance: 53.2067
Implantable Lead Implant Date: 20070724
Implantable Lead Implant Date: 20070724
Implantable Lead Location: 753859
Implantable Lead Location: 753860
Implantable Lead Model: 5076
Implantable Lead Model: 7001
Implantable Pulse Generator Implant Date: 20140423
Lead Channel Impedance Value: 312.5 Ohm
Lead Channel Impedance Value: 362.5 Ohm
Lead Channel Pacing Threshold Amplitude: 1 V
Lead Channel Pacing Threshold Amplitude: 1 V
Lead Channel Pacing Threshold Pulse Width: 0.5 ms
Lead Channel Pacing Threshold Pulse Width: 0.5 ms
Lead Channel Sensing Intrinsic Amplitude: 12 mV
Lead Channel Setting Pacing Amplitude: 2.5 V
Lead Channel Setting Pacing Pulse Width: 0.5 ms
Lead Channel Setting Sensing Sensitivity: 2 mV
Pulse Gen Serial Number: 1102557

## 2019-09-11 LAB — BASIC METABOLIC PANEL
BUN/Creatinine Ratio: 16 (ref 10–24)
BUN: 23 mg/dL (ref 8–27)
CO2: 20 mmol/L (ref 20–29)
Calcium: 9.2 mg/dL (ref 8.6–10.2)
Chloride: 97 mmol/L (ref 96–106)
Creatinine, Ser: 1.46 mg/dL — ABNORMAL HIGH (ref 0.76–1.27)
GFR calc Af Amer: 59 mL/min/{1.73_m2} — ABNORMAL LOW (ref >59)
GFR calc non Af Amer: 51 mL/min/{1.73_m2} — ABNORMAL LOW (ref >59)
Glucose: 111 mg/dL — ABNORMAL HIGH (ref 65–99)
Potassium: 4 mmol/L (ref 3.5–5.2)
Sodium: 136 mmol/L (ref 134–144)

## 2019-09-11 LAB — CBC
Hematocrit: 42.9 % (ref 37.5–51.0)
Hemoglobin: 14.8 g/dL (ref 13.0–17.7)
MCH: 31.2 pg (ref 26.6–33.0)
MCHC: 34.5 g/dL (ref 31.5–35.7)
MCV: 91 fL (ref 79–97)
Platelets: 227 10*3/uL (ref 150–450)
RBC: 4.74 x10E6/uL (ref 4.14–5.80)
RDW: 13.5 % (ref 11.6–15.4)
WBC: 10.6 10*3/uL (ref 3.4–10.8)

## 2019-09-12 ENCOUNTER — Telehealth: Payer: Self-pay | Admitting: *Deleted

## 2019-09-12 ENCOUNTER — Encounter: Payer: Self-pay | Admitting: *Deleted

## 2019-09-12 NOTE — Telephone Encounter (Signed)
Lvm on patient home and cell number to contact clinic back to pick up Updated letter with medication instructions Letter or also a letter will be  mailed to home

## 2019-09-12 NOTE — Telephone Encounter (Signed)
Spoke with patient and  aware  Of  updated letter  And wrote down new  ( Xarelto) Instructions while letter being mailed

## 2019-09-12 NOTE — Telephone Encounter (Signed)
Patient returned call

## 2019-09-13 ENCOUNTER — Inpatient Hospital Stay (HOSPITAL_COMMUNITY): Admission: RE | Admit: 2019-09-13 | Payer: Medicaid Other | Source: Ambulatory Visit

## 2019-09-15 ENCOUNTER — Other Ambulatory Visit (HOSPITAL_COMMUNITY)
Admission: RE | Admit: 2019-09-15 | Discharge: 2019-09-15 | Disposition: A | Discharge status: Home / Self Care | Payer: Medicaid Other | Source: Ambulatory Visit | Attending: Internal Medicine | Admitting: Internal Medicine

## 2019-09-15 DIAGNOSIS — Z20828 Contact with and (suspected) exposure to other viral communicable diseases: Secondary | ICD-10-CM | POA: Diagnosis not present

## 2019-09-15 DIAGNOSIS — Z01812 Encounter for preprocedural laboratory examination: Secondary | ICD-10-CM | POA: Diagnosis not present

## 2019-09-16 LAB — NOVEL CORONAVIRUS, NAA (HOSP ORDER, SEND-OUT TO REF LAB; TAT 18-24 HRS): SARS-CoV-2, NAA: NOT DETECTED

## 2019-09-17 NOTE — Progress Notes (Signed)
Notified patient that procedure turn was now 13:00, he needs to arrive at 11:00 on Wednesday

## 2019-09-19 ENCOUNTER — Ambulatory Visit (HOSPITAL_COMMUNITY)
Admission: RE | Admit: 2019-09-19 | Discharge: 2019-09-19 | Disposition: A | Discharge status: Home / Self Care | Payer: Medicaid Other | Attending: Internal Medicine | Admitting: Internal Medicine

## 2019-09-19 ENCOUNTER — Other Ambulatory Visit: Payer: Self-pay

## 2019-09-19 ENCOUNTER — Ambulatory Visit (HOSPITAL_COMMUNITY)
Admission: RE | Disposition: A | Discharge status: Home / Self Care | Payer: Self-pay | Source: Home / Self Care | Attending: Internal Medicine

## 2019-09-19 DIAGNOSIS — E785 Hyperlipidemia, unspecified: Secondary | ICD-10-CM | POA: Diagnosis not present

## 2019-09-19 DIAGNOSIS — Z79899 Other long term (current) drug therapy: Secondary | ICD-10-CM | POA: Diagnosis not present

## 2019-09-19 DIAGNOSIS — Z45010 Encounter for checking and testing of cardiac pacemaker pulse generator [battery]: Secondary | ICD-10-CM | POA: Diagnosis not present

## 2019-09-19 DIAGNOSIS — F419 Anxiety disorder, unspecified: Secondary | ICD-10-CM | POA: Diagnosis not present

## 2019-09-19 DIAGNOSIS — I421 Obstructive hypertrophic cardiomyopathy: Secondary | ICD-10-CM | POA: Diagnosis not present

## 2019-09-19 DIAGNOSIS — I5022 Chronic systolic (congestive) heart failure: Secondary | ICD-10-CM | POA: Diagnosis not present

## 2019-09-19 DIAGNOSIS — F17210 Nicotine dependence, cigarettes, uncomplicated: Secondary | ICD-10-CM | POA: Diagnosis not present

## 2019-09-19 DIAGNOSIS — I428 Other cardiomyopathies: Secondary | ICD-10-CM | POA: Insufficient documentation

## 2019-09-19 DIAGNOSIS — G4700 Insomnia, unspecified: Secondary | ICD-10-CM | POA: Insufficient documentation

## 2019-09-19 DIAGNOSIS — I4821 Permanent atrial fibrillation: Secondary | ICD-10-CM | POA: Insufficient documentation

## 2019-09-19 DIAGNOSIS — Z7901 Long term (current) use of anticoagulants: Secondary | ICD-10-CM | POA: Insufficient documentation

## 2019-09-19 DIAGNOSIS — I442 Atrioventricular block, complete: Secondary | ICD-10-CM | POA: Diagnosis not present

## 2019-09-19 DIAGNOSIS — K219 Gastro-esophageal reflux disease without esophagitis: Secondary | ICD-10-CM | POA: Diagnosis not present

## 2019-09-19 DIAGNOSIS — Z4502 Encounter for adjustment and management of automatic implantable cardiac defibrillator: Secondary | ICD-10-CM

## 2019-09-19 HISTORY — PX: ICD GENERATOR CHANGEOUT: EP1231

## 2019-09-19 LAB — SURGICAL PCR SCREEN
MRSA, PCR: NEGATIVE
Staphylococcus aureus: POSITIVE — AB

## 2019-09-19 SURGERY — ICD GENERATOR CHANGEOUT

## 2019-09-19 MED ORDER — ONDANSETRON HCL 4 MG/2ML IJ SOLN: 4.0000 mg | Freq: Four times a day (QID) | INTRAMUSCULAR | Status: DC | PRN

## 2019-09-19 MED ORDER — CEFAZOLIN SODIUM-DEXTROSE 2-4 GM/100ML-% IV SOLN
INTRAVENOUS | Status: AC
  Filled 2019-09-19: qty 100

## 2019-09-19 MED ORDER — SODIUM CHLORIDE 0.9 % IV SOLN
INTRAVENOUS | Status: AC
  Filled 2019-09-19: qty 2

## 2019-09-19 MED ORDER — ACETAMINOPHEN 325 MG PO TABS: 325.0000 mg | ORAL_TABLET | ORAL | Status: DC | PRN

## 2019-09-19 MED ORDER — MIDAZOLAM HCL 5 MG/5ML IJ SOLN
INTRAMUSCULAR | Status: AC
  Filled 2019-09-19: qty 5

## 2019-09-19 MED ORDER — CHLORHEXIDINE GLUCONATE 4 % EX LIQD: 4.0000 "application " | Freq: Once | CUTANEOUS | Status: DC

## 2019-09-19 MED ORDER — SODIUM CHLORIDE 0.9 % IV SOLN
80.0000 mg | INTRAVENOUS | Status: AC
  Administered 2019-09-19: 80 mg

## 2019-09-19 MED ORDER — FENTANYL CITRATE (PF) 100 MCG/2ML IJ SOLN
INTRAMUSCULAR | Status: DC | PRN
  Administered 2019-09-19: 25 ug via INTRAVENOUS
  Administered 2019-09-19: 50 ug via INTRAVENOUS
  Administered 2019-09-19: 25 ug via INTRAVENOUS

## 2019-09-19 MED ORDER — SODIUM CHLORIDE 0.9 % IV SOLN
INTRAVENOUS | Status: DC
  Administered 2019-09-19: 12:00:00 via INTRAVENOUS

## 2019-09-19 MED ORDER — CEFAZOLIN SODIUM-DEXTROSE 2-4 GM/100ML-% IV SOLN
2.0000 g | INTRAVENOUS | Status: AC
  Administered 2019-09-19: 2 g via INTRAVENOUS

## 2019-09-19 MED ORDER — FENTANYL CITRATE (PF) 100 MCG/2ML IJ SOLN
INTRAMUSCULAR | Status: AC
  Filled 2019-09-19: qty 2

## 2019-09-19 MED ORDER — MUPIROCIN 2 % EX OINT
TOPICAL_OINTMENT | CUTANEOUS | Status: AC
  Filled 2019-09-19: qty 22

## 2019-09-19 MED ORDER — MIDAZOLAM HCL 5 MG/5ML IJ SOLN
INTRAMUSCULAR | Status: DC | PRN
  Administered 2019-09-19: 2 mg via INTRAVENOUS
  Administered 2019-09-19: 1 mg via INTRAVENOUS
  Administered 2019-09-19: 2 mg via INTRAVENOUS

## 2019-09-19 MED ORDER — LIDOCAINE HCL 1 % IJ SOLN
INTRAMUSCULAR | Status: AC
  Filled 2019-09-19: qty 60

## 2019-09-19 MED ORDER — LIDOCAINE HCL (PF) 1 % IJ SOLN
INTRAMUSCULAR | Status: DC | PRN
  Administered 2019-09-19: 60 mL

## 2019-09-19 SURGICAL SUPPLY — 8 items
ASSURA CRTD CD3369-40C (ICD Generator) ×3 IMPLANT
CABLE SURGICAL S-101-97-12 (CABLE) ×3 IMPLANT
DEFIB ASSURA CRT-D (ICD Generator) IMPLANT
HEMOSTAT SURGICEL 2X4 FIBR (HEMOSTASIS) ×2 IMPLANT
PAD PRO RADIOLUCENT 2001M-C (PAD) ×3 IMPLANT
POUCH AIGIS-R ANTIBACT ICD (Mesh General) ×3 IMPLANT
POUCH AIGIS-R ANTIBACT ICD LRG (Mesh General) IMPLANT
TRAY PACEMAKER INSERTION (PACKS) ×3 IMPLANT

## 2019-09-19 NOTE — Interval H&P Note (Signed)
ICD Criteria  Current LVEF:50%. Within 12 months prior to implant: No   Heart failure history: Yes, Class II  Cardiomyopathy history: Yes, Non-Ischemic Cardiomyopathy.  Atrial Fibrillation/Atrial Flutter: Yes, Persistent (> 7 Days).  Ventricular tachycardia history: No.  Cardiac arrest history: No.  History of syndromes with risk of sudden death: Yes, Other  Previous ICD: Yes, Reason for ICD:  Primary prevention.  Current ICD indication: Primary  PPM indication: Yes. Pacing type: Ventricular. Greater than 40% RV pacing requirement anticipated. Indication: Complete Heart Block  Class I or II Bradycardia indication present: Yes  Beta Blocker therapy for 3 or more months: No, medical reason.  Ace Inhibitor/ARB therapy for 3 or more months: Yes, prescribed.    I have seen Charles Le is a 63 y.o. malepre-procedural for consideration of ICD implant for PRIMARY prevention of sudden death.  The patient's chart has been reviewed and they meet criteria for ICD implant.  I have had a thorough discussion with the patient reviewing options.  The patient and their family (if available) have had opportunities to ask questions and have them answered. The patient and I have decided together through the [place] [employer] to REIMPLANT ICD at this time.  Risks, benefits, alternatives to ICD implantation were discussed in detail with the patient today. The patient  understands that the risks include but are not limited to bleeding, infection, pneumothorax, perforation, tamponade, vascular damage, renal failure, MI, stroke, death, inappropriate shocks, and lead dislodgement and  wishes to proceed.  History and Physical Interval Note:  09/19/2019 12:31 PM  Charles Le  has presented today for surgery, with the diagnosis of eri.  The various methods of treatment have been discussed with the patient and family. After consideration of risks, benefits and other options for  treatment, the patient has consented to  Procedure(s): ICD [place] (N/A) as a surgical intervention.  The patient's history has been reviewed, patient examined, no change in status, stable for surgery.  I have reviewed the patient's chart and labs.  Questions were answered to the patient's satisfaction.     Virl Axe

## 2019-09-19 NOTE — Discharge Instructions (Signed)
Dermabond will come off in next 10-14 days; if not will remove at wound check  Keep wound dry until tomorrow evening  No Driving x 4 days  Wound check in office as scheduled  xarelto 1/2 dose tonight and tomorrow night then resume full dose on Friday night  Implantable Cardiac Device Battery Change, Care After This sheet gives you information about how to care for yourself after your procedure. Your health care provider may also give you more specific instructions. If you have problems or questions, contact your health care provider. What can I expect after the procedure? After your procedure, it is common to have:  Pain or soreness at the site where the cardiac device was inserted.  Swelling at the site where the cardiac device was inserted.  You should received an information card for your new device in 4-8 weeks. Follow these instructions at home: Incision care   Keep the incision clean and dry. ? Do not take baths, swim, or use a hot tub until after your wound check.  ? Do not shower for at least 7 days, or as directed by your health care provider. ? Pat the area dry with a clean towel. Do not rub the area. This may cause bleeding.  Follow instructions from your health care provider about how to take care of your incision. Make sure you: ? Leave stitches (sutures), skin glue, or adhesive strips in place. These skin closures may need to stay in place for 2 weeks or longer. If adhesive strip edges start to loosen and curl up, you may trim the loose edges. Do not remove adhesive strips completely unless your health care provider tells you to do that.  Check your incision area every day for signs of infection. Check for: ? More redness, swelling, or pain. ? More fluid or blood. ? Warmth. ? Pus or a bad smell. Activity  Do not lift anything that is heavier than 10 lb (4.5 kg) until your health care provider says it is okay to do so.  For the first week, or as long as told by your  health care provider: ? Avoid lifting your affected arm higher than your shoulder. ? After 1 week, Be gentle when you move your arms over your head. It is okay to raise your arm to comb your hair. ? Avoid strenuous exercise.  Ask your health care provider when it is okay to: ? Resume your normal activities. ? Return to work or school. ? Resume sexual activity. Eating and drinking  Eat a heart-healthy diet. This should include plenty of fresh fruits and vegetables, whole grains, low-fat dairy products, and lean protein like chicken and fish.  Limit alcohol intake to no more than 1 drink a day for non-pregnant women and 2 drinks a day for men. One drink equals 12 oz of beer, 5 oz of wine, or 1 oz of hard liquor.  Check ingredients and nutrition facts on packaged foods and beverages. Avoid the following types of food: ? Food that is high in salt (sodium). ? Food that is high in saturated fat, like full-fat dairy or red meat. ? Food that is high in trans fat, like fried food. ? Food and drinks that are high in sugar. Lifestyle  Do not use any products that contain nicotine or tobacco, such as cigarettes and e-cigarettes. If you need help quitting, ask your health care provider.  Take steps to manage and control your weight.  Once cleared, get regular exercise. Aim for  150 minutes of moderate-intensity exercise (such as walking or yoga) or 75 minutes of vigorous exercise (such as running or swimming) each week.  Manage other health problems, such as diabetes or high blood pressure. Ask your health care provider how you can manage these conditions. General instructions  Do not drive for 24 hours after your procedure if you were given a medicine to help you relax (sedative).  Take over-the-counter and prescription medicines only as told by your health care provider.  Avoid putting pressure on the area where the cardiac device was placed.  If you need an MRI after your cardiac device  has been placed, be sure to tell the health care provider who orders the MRI that you have a cardiac device.  Avoid close and prolonged exposure to electrical devices that have strong magnetic fields. These include: ? Cell phones. Avoid keeping them in a pocket near the cardiac device, and try using the ear opposite the cardiac device. ? MP3 players. ? Household appliances, like microwaves. ? Metal detectors. ? Electric generators. ? High-tension wires.  Keep all follow-up visits as directed by your health care provider. This is important. Contact a health care provider if:  You have pain at the incision site that is not relieved by over-the-counter or prescription medicines.  You have any of these around your incision site or coming from it: ? More redness, swelling, or pain. ? Fluid or blood. ? Warmth to the touch. ? Pus or a bad smell.  You have a fever.  You feel brief, occasional palpitations, light-headedness, or any symptoms that you think might be related to your heart. Get help right away if:  You experience chest pain that is different from the pain at the cardiac device site.  You develop a red streak that extends above or below the incision site.  You experience shortness of breath.  You have palpitations or an irregular heartbeat.  You have light-headedness that does not go away quickly.  You faint or have dizzy spells.  Your pulse suddenly drops or increases rapidly and does not return to normal.  You begin to gain weight and your legs and ankles swell. Summary  After your procedure, it is common to have pain, soreness, and some swelling where the cardiac device was inserted.  Make sure to keep your incision clean and dry. Follow instructions from your health care provider about how to take care of your incision.  Check your incision every day for signs of infection, such as more pain or swelling, pus or a bad smell, warmth, or leaking fluid and  blood.  Avoid strenuous exercise and lifting your left arm higher than your shoulder for 2 weeks, or as long as told by your health care provider. This information is not intended to replace advice given to you by your health care provider. Make sure you discuss any questions you have with your health care provider.

## 2019-09-20 ENCOUNTER — Encounter (HOSPITAL_COMMUNITY): Payer: Self-pay | Admitting: Internal Medicine

## 2019-09-20 MED FILL — Lidocaine HCl Local Inj 1%: INTRAMUSCULAR | Qty: 60 | Status: AC

## 2019-09-20 MED FILL — Gentamicin Sulfate Inj 40 MG/ML: INTRAMUSCULAR | Qty: 80 | Status: AC

## 2019-10-04 ENCOUNTER — Ambulatory Visit (INDEPENDENT_AMBULATORY_CARE_PROVIDER_SITE_OTHER): Payer: Medicaid Other | Admitting: *Deleted

## 2019-10-04 ENCOUNTER — Other Ambulatory Visit: Payer: Self-pay

## 2019-10-04 DIAGNOSIS — I422 Other hypertrophic cardiomyopathy: Secondary | ICD-10-CM | POA: Diagnosis not present

## 2019-10-04 DIAGNOSIS — Z95810 Presence of automatic (implantable) cardiac defibrillator: Secondary | ICD-10-CM | POA: Diagnosis not present

## 2019-10-04 DIAGNOSIS — I442 Atrioventricular block, complete: Secondary | ICD-10-CM

## 2019-10-04 LAB — CUP PACEART INCLINIC DEVICE CHECK
Battery Remaining Longevity: 84 mo
Brady Statistic RA Percent Paced: 0 %
Brady Statistic RV Percent Paced: 93 %
Date Time Interrogation Session: 20201203173633
HighPow Impedance: 54 Ohm
Implantable Lead Implant Date: 20070724
Implantable Lead Implant Date: 20070724
Implantable Lead Location: 753859
Implantable Lead Location: 753860
Implantable Lead Model: 5076
Implantable Lead Model: 7001
Implantable Pulse Generator Implant Date: 20201118
Lead Channel Impedance Value: 337.5 Ohm
Lead Channel Impedance Value: 400 Ohm
Lead Channel Pacing Threshold Amplitude: 1 V
Lead Channel Pacing Threshold Pulse Width: 0.5 ms
Lead Channel Sensing Intrinsic Amplitude: 11.8 mV
Lead Channel Setting Pacing Amplitude: 2.5 V
Lead Channel Setting Pacing Pulse Width: 0.5 ms
Lead Channel Setting Sensing Sensitivity: 0.5 mV
Pulse Gen Serial Number: 9887144

## 2019-10-04 NOTE — Progress Notes (Signed)
Wound check appointment. Dermabond removed. Wound without redness or edema. Stitch partially removed from right lateral incision. Incision edges otherwise approximated and healing well. Pt educated to wash site daily with soap and water, monitor for signs/symptoms of infection or stitch. F/u on 10/10/19 while Dr. Caryl Comes in office for site reassessment. Normal device function. Threshold, sensing, and impedances consistent with implant measurements. Device programmed at chronic output. Histogram distribution blunted, sensor is enabled, pt reports minimal activity since hospital d/c. Known permanent AF, +Xarelto. No ventricular arrhythmias noted. Patient educated about wound care, arm mobility, lifting restrictions, shock plan, and Merlin monitor. ROV with Dr. Caryl Comes on 12/19/19.

## 2019-10-04 NOTE — Patient Instructions (Addendum)
Wash your incision once daily while bathing. Use a clean towel and pat dry. Call the [facility] at [phone] for any signs or symptoms of infection, including drainage, redness, swelling, fever, or chills. Call if you feel a stitch or notice a pimple-like bump at the incision.

## 2019-10-10 ENCOUNTER — Ambulatory Visit: Payer: Medicaid Other

## 2019-10-11 ENCOUNTER — Ambulatory Visit (INDEPENDENT_AMBULATORY_CARE_PROVIDER_SITE_OTHER): Payer: Medicaid Other | Admitting: *Deleted

## 2019-10-11 ENCOUNTER — Other Ambulatory Visit: Payer: Self-pay

## 2019-10-11 DIAGNOSIS — I442 Atrioventricular block, complete: Secondary | ICD-10-CM

## 2019-10-11 NOTE — Patient Instructions (Signed)
Call office for any drainage, redness, or edema at wound site. Call if you develop a fever.

## 2019-10-11 NOTE — Progress Notes (Signed)
Wound intact with white slightly raised pin point area at proximal end of  incision and  scab present at distal end of incision. Dr Caryl Comes evaluated incision and suture removed from pin point area by Dr Caryl Comes. Triple antibiotic ointment and band-aid applied to area of incision where suture removed. Follow up visit scheduled for with Dr Caryl Comes on 12/19/19. Wound care instructions given and will call office if he has any s/sx of infection.

## 2019-11-22 ENCOUNTER — Telehealth: Payer: Self-pay | Admitting: Internal Medicine

## 2019-11-22 NOTE — Telephone Encounter (Signed)
Patient's PCP Zoe Lan calling stating the patient is requesting to take Viagra for ED symptoms. They would like a call back to find out if that would be alright. She states they can be reached at [phone] and to ask for [place] or [place].

## 2019-11-27 NOTE — Telephone Encounter (Signed)
There is a contraindication to using these drugs for erectile dysfunction in patients with obstructive hypertrophic cardiomyopathy.  I think the best answer is no.

## 2019-11-28 NOTE — Telephone Encounter (Signed)
Spoke with Byrd Hesselbach at pt's PCP office who states she will pass message along to pt's NP.  Per Dr Graciela Husbands there is a contraindication to using ED drugs in patient's who have the diagnosis that Mr Dieppa has of obstructive hypertrophic cardiomyopathy, best not to prescribe ED medication.

## 2019-12-19 ENCOUNTER — Encounter: Payer: Self-pay | Admitting: Internal Medicine

## 2019-12-19 ENCOUNTER — Ambulatory Visit (INDEPENDENT_AMBULATORY_CARE_PROVIDER_SITE_OTHER): Payer: Medicaid Other | Admitting: Internal Medicine

## 2019-12-19 ENCOUNTER — Other Ambulatory Visit: Payer: Self-pay

## 2019-12-19 ENCOUNTER — Ambulatory Visit (INDEPENDENT_AMBULATORY_CARE_PROVIDER_SITE_OTHER): Payer: Medicaid Other | Admitting: *Deleted

## 2019-12-19 VITALS — BP 104/62 | HR 70 | Ht 71.0 in | Wt 254.0 lb

## 2019-12-19 DIAGNOSIS — I4821 Permanent atrial fibrillation: Secondary | ICD-10-CM

## 2019-12-19 DIAGNOSIS — Z95810 Presence of automatic (implantable) cardiac defibrillator: Secondary | ICD-10-CM

## 2019-12-19 DIAGNOSIS — I442 Atrioventricular block, complete: Secondary | ICD-10-CM

## 2019-12-19 DIAGNOSIS — I421 Obstructive hypertrophic cardiomyopathy: Secondary | ICD-10-CM

## 2019-12-19 LAB — CUP PACEART REMOTE DEVICE CHECK
Battery Remaining Longevity: 83 mo
Battery Remaining Percentage: 92 %
Battery Voltage: 3.13 V
Brady Statistic RV Percent Paced: 95 %
Date Time Interrogation Session: 20210217062955
HighPow Impedance: 54 Ohm
HighPow Impedance: 54 Ohm
Implantable Lead Implant Date: 20070724
Implantable Lead Implant Date: 20070724
Implantable Lead Location: 753859
Implantable Lead Location: 753860
Implantable Lead Model: 5076
Implantable Lead Model: 7001
Implantable Pulse Generator Implant Date: 20201118
Lead Channel Impedance Value: 310 Ohm
Lead Channel Pacing Threshold Amplitude: 1 V
Lead Channel Pacing Threshold Pulse Width: 0.5 ms
Lead Channel Sensing Intrinsic Amplitude: 11.8 mV
Lead Channel Setting Pacing Amplitude: 2.5 V
Lead Channel Setting Pacing Pulse Width: 0.5 ms
Lead Channel Setting Sensing Sensitivity: 0.5 mV
Pulse Gen Serial Number: 9887144

## 2019-12-19 NOTE — Progress Notes (Signed)
ICD Remote  

## 2019-12-19 NOTE — Progress Notes (Signed)
Patient Care Team: Berkley Harvey, NP as PCP - General (Nurse Practitioner) Deboraha Sprang, MD as PCP - Cardiology (Cardiology)   HPI  Charles Le is a 64 y.o. male  With hypertrophic cardiomyopathy complicated by permanent atrial arrhythmias. He also has complete heart block and is status post ICD implantation for primary prevention.    DATE TEST EF   11/12 Echo   50-55 %   11/16 Echo   50-55 %          The patient denies chest pain, shortness of breath, nocturnal dyspnea, orthopnea or peripheral edema.  There have been no palpitations, lightheadedness or syncope.    Struggling with erectile dysfunction.     Past Medical History:  Diagnosis Date  . Aflutter    atypical-left sided  . Anxiety    Xanax for panic attacks  . Arthritis    OA AND PAIN RT HIP;  S/P LEFT TOTAL HIP REPLACEMENT  . Atrioventricular block, complete (HCC)    complete  . Automatic implantable cardioverter-defibrillator in situ    GENERATOR CHANGE 2014  . DDD-ICD    [place] DR [phone] ) dual chamber ICD implanted May 24, 2006)  . GERD (gastroesophageal reflux disease)   . H/O hiatal hernia   . Heart murmur    since childhood  . Hyperlipidemia   . Hypertr obst cardiomyop    hypertrophic  . Insomnia   . PONV (postoperative nausea and vomiting)    A LITTLE NAUSEA AFTER PREVIOUS HIP REPLACEMENT    Past Surgical History:  Procedure Laterality Date  . CARDIOVERSION  2006  . COLONOSCOPY WITH PROPOFOL N/A 12/08/2016   Procedure: COLONOSCOPY WITH PROPOFOL;  Surgeon: Arta Silence, MD;  Location: [facility];  Service: Endoscopy;  Laterality: N/A;  . HERNIA REPAIR     VENTRAL HERNIA REPAIR 11/22/11  . HIP SURGERY     replacement bilateral  . ICD GENERATOR CHANGEOUT N/A 09/19/2019   Procedure: ICD GENERATOR CHANGEOUT;  Surgeon: Deboraha Sprang, MD;  Location: [facility];  Service: Cardiovascular;  Laterality: N/A;  . ICD implantation     ICD- St Jude  . IMPLANTABLE  CARDIOVERTER DEFIBRILLATOR (ICD) GENERATOR CHANGE N/A 02/21/2013   Procedure: ICD GENERATOR CHANGE;  Surgeon: Deboraha Sprang, MD;  Location: [facility] CATH LAB;  Service: Cardiovascular;  Laterality: N/A;  . INCISIONAL HERNIA REPAIR N/A 12/26/2017   Procedure: INCISIONAL HERNIA REPAIR WITH MESH;  Surgeon: Coralie Keens, MD;  Location: [place];  Service: General;  Laterality: N/A;  . INSERTION OF MESH N/A 12/26/2017   Procedure: INSERTION OF MESH;  Surgeon: Coralie Keens, MD;  Location: [place];  Service: General;  Laterality: N/A;  . JOINT REPLACEMENT  Oct 07, 2009   LEFT TOTAL HIP ARTHROPLASTY  . TOTAL HIP ARTHROPLASTY Right 12/18/2013   Procedure: RIGHT TOTAL HIP ARTHROPLASTY ANTERIOR APPROACH;  Surgeon: Mauri Pole, MD;  Location: [facility];  Service: Orthopedics;  Laterality: Right;  . VENTRAL HERNIA REPAIR  11/22/2011   Procedure: HERNIA REPAIR VENTRAL ADULT;  Surgeon: Harl Bowie, MD;  Location: [place];  Service: General;  Laterality: N/A;  Ventral hernia repair with mesh    Current Outpatient Medications  Medication Sig Dispense Refill  . ALPRAZolam (XANAX) 0.5 MG tablet Take 0.5 mg by mouth 2 (two) times daily as needed for anxiety.     [name] atorvastatin (LIPITOR) 40 MG tablet Take 40 mg by mouth daily.    [name] dexlansoprazole (DEXILANT) 60 MG capsule  Take 60 mg by mouth daily.     [name] lisinopril-hydrochlorothiazide (PRINZIDE,ZESTORETIC) 20-12.5 MG tablet TAKE 1 TABLET BY MOUTH DAILY. 90 tablet 2  . Multiple Vitamin (MULTIVITAMIN WITH MINERALS) TABS Take 1 tablet by mouth daily.    . rivaroxaban (XARELTO) 20 MG TABS tablet Take 1 tablet (20 mg total) by mouth daily with supper. 30 tablet 5  . traZODone (DESYREL) 50 MG tablet Take 50 mg by mouth at bedtime as needed for sleep.      No current facility-administered medications for this visit.    No Known Allergies  Review of Systems negative except from HPI and PMH  Physical Exam BP 104/62   Pulse 70   Ht 5\' 11"  (1.803 m)   Wt 254 lb  (115.2 kg)   SpO2 96%   BMI 35.43 kg/m  Well developed and well nourished in no acute distress HENT normal Neck supple with JVP-flat Clear Device pocket well healed; without hematoma or erythema.  There is no tethering  Regular rate and rhythm,2-6 murmur Abd-soft with active BS No Clubbing cyanosis  edema Skin-warm and dry A & Oriented  Grossly normal sensory and motor function  ECG atrial fibrillation with ventricular pacing at 70 Interval-/22/49  Assessment and  Plan  Hypertrophic cardiomyopathy   Complete heart block  ICD-St. Jude The patient's device was interrogated.  The information was reviewed. No changes were made in the programming.    Hypertension  Blood pressure well controlled  Atrial fibrillation-permanent.  Without bleeding on anticoagulation.  Desires medication for ED.  Up-to-date suggest contraindicated in setting of obstructive-HCM  Have reached out to Dr. [id number] at [facility] for his insights.

## 2019-12-19 NOTE — Patient Instructions (Signed)
Medication Instructions:  Your physician recommends that you continue on your current medications as directed. Please refer to the Current Medication list given to you today.  Labwork: None ordered.  Testing/Procedures: None ordered.  Follow-Up: Your physician wants you to follow-up in: 9 months with Dr Klein You will receive a reminder letter in the mail two months in advance. If you don't receive a letter, please call our office to schedule the follow-up appointment.  Remote monitoring is used to monitor your Pacemaker of ICD from home. This monitoring reduces the number of office visits required to check your device to one time per year. It allows us to keep an eye on the functioning of your device to ensure it is working properly.  Any Other Special Instructions Will Be Listed Below (If Applicable).  If you need a refill on your cardiac medications before your next appointment, please call your pharmacy.   

## 2019-12-24 LAB — CUP PACEART INCLINIC DEVICE CHECK
Brady Statistic RV Percent Paced: 95 %
Date Time Interrogation Session: 20210217090606
Implantable Lead Implant Date: 20070724
Implantable Lead Implant Date: 20070724
Implantable Lead Location: 753859
Implantable Lead Location: 753860
Implantable Lead Model: 5076
Implantable Lead Model: 7001
Implantable Pulse Generator Implant Date: 20201118
Lead Channel Pacing Threshold Amplitude: 1 V
Lead Channel Pacing Threshold Pulse Width: 0.5 ms
Pulse Gen Serial Number: 9887144

## 2020-01-26 ENCOUNTER — Ambulatory Visit: Payer: Medicaid Other | Attending: Internal Medicine

## 2020-01-26 DIAGNOSIS — Z23 Encounter for immunization: Secondary | ICD-10-CM

## 2020-01-26 NOTE — Progress Notes (Signed)
   Covid-19 Vaccination Clinic  Name:  Charles Le    MRN: [id number] DOB: 07-02-1956  01/26/2020  [name] was observed post Covid-19 immunization for 15 minutes without incident. He was provided with Vaccine Information Sheet and instruction to access the V-Safe system.   [name] was instructed to call 911 with any severe reactions post vaccine: [name] Difficulty breathing  . Swelling of face and throat  . A fast heartbeat  . A bad rash all over body  . Dizziness and weakness   Immunizations Administered    Name Date Dose VIS Date Route   Pfizer COVID-19 Vaccine 01/26/2020  4:20 PM 0.3 mL 10/12/2019 Intramuscular   Manufacturer: [employer], [employer]   Lot: [id number]   NDC: [address]-6

## 2020-02-19 ENCOUNTER — Ambulatory Visit: Payer: Medicaid Other | Attending: Internal Medicine

## 2020-02-19 DIAGNOSIS — Z23 Encounter for immunization: Secondary | ICD-10-CM

## 2020-02-19 NOTE — Progress Notes (Signed)
   Covid-19 Vaccination Clinic  Name:  KEENEN ROESSNER    MRN: [id number] DOB: Jul 27, 1956  02/19/2020  Mr. Braithwaite was observed post Covid-19 immunization for 15 minutes without incident. He was provided with Vaccine Information Sheet and instruction to access the V-Safe system.   Mr. Grundman was instructed to call 911 with any severe reactions post vaccine: [name] Difficulty breathing  . Swelling of face and throat  . A fast heartbeat  . A bad rash all over body  . Dizziness and weakness   Immunizations Administered    Name Date Dose VIS Date Route   Pfizer COVID-19 Vaccine 02/19/2020  2:43 PM 0.3 mL 12/26/2018 Intramuscular   Manufacturer: [employer], [employer]   Lot: [id number]   NDC: [address]-6

## 2020-03-19 ENCOUNTER — Ambulatory Visit (INDEPENDENT_AMBULATORY_CARE_PROVIDER_SITE_OTHER): Payer: Medicaid Other | Admitting: *Deleted

## 2020-03-19 DIAGNOSIS — I442 Atrioventricular block, complete: Secondary | ICD-10-CM | POA: Diagnosis not present

## 2020-03-19 LAB — CUP PACEART REMOTE DEVICE CHECK
Battery Remaining Longevity: 80 mo
Battery Remaining Percentage: 88 %
Battery Voltage: 3.05 V
Brady Statistic RV Percent Paced: 99 %
Date Time Interrogation Session: 20210519060220
HighPow Impedance: 54 Ohm
HighPow Impedance: 54 Ohm
Implantable Lead Implant Date: 20070724
Implantable Lead Implant Date: 20070724
Implantable Lead Location: 753859
Implantable Lead Location: 753860
Implantable Lead Model: 5076
Implantable Lead Model: 7001
Implantable Pulse Generator Implant Date: 20201118
Lead Channel Impedance Value: 310 Ohm
Lead Channel Pacing Threshold Amplitude: 1 V
Lead Channel Pacing Threshold Pulse Width: 0.5 ms
Lead Channel Sensing Intrinsic Amplitude: 11.8 mV
Lead Channel Setting Pacing Amplitude: 2.5 V
Lead Channel Setting Pacing Pulse Width: 0.5 ms
Lead Channel Setting Sensing Sensitivity: 0.5 mV
Pulse Gen Serial Number: 9887144

## 2020-03-21 NOTE — Progress Notes (Signed)
Remote ICD transmission.   

## 2020-06-18 ENCOUNTER — Ambulatory Visit (INDEPENDENT_AMBULATORY_CARE_PROVIDER_SITE_OTHER): Payer: Medicaid Other | Admitting: *Deleted

## 2020-06-18 DIAGNOSIS — I421 Obstructive hypertrophic cardiomyopathy: Secondary | ICD-10-CM

## 2020-06-18 LAB — CUP PACEART REMOTE DEVICE CHECK
Battery Remaining Longevity: 78 mo
Battery Remaining Percentage: 85 %
Battery Voltage: 3.02 V
Brady Statistic RV Percent Paced: 98 %
Date Time Interrogation Session: 20210818020017
HighPow Impedance: 61 Ohm
HighPow Impedance: 61 Ohm
Implantable Lead Implant Date: 20070724
Implantable Lead Implant Date: 20070724
Implantable Lead Location: 753859
Implantable Lead Location: 753860
Implantable Lead Model: 5076
Implantable Lead Model: 7001
Implantable Pulse Generator Implant Date: 20201118
Lead Channel Impedance Value: 350 Ohm
Lead Channel Pacing Threshold Amplitude: 1 V
Lead Channel Pacing Threshold Pulse Width: 0.5 ms
Lead Channel Sensing Intrinsic Amplitude: 9.1 mV
Lead Channel Setting Pacing Amplitude: 2.5 V
Lead Channel Setting Pacing Pulse Width: 0.5 ms
Lead Channel Setting Sensing Sensitivity: 0.5 mV
Pulse Gen Serial Number: 9887144

## 2020-06-19 NOTE — Progress Notes (Signed)
Remote ICD transmission.   

## 2020-09-17 ENCOUNTER — Ambulatory Visit (INDEPENDENT_AMBULATORY_CARE_PROVIDER_SITE_OTHER): Payer: Medicaid Other

## 2020-09-17 DIAGNOSIS — I422 Other hypertrophic cardiomyopathy: Secondary | ICD-10-CM

## 2020-09-17 LAB — CUP PACEART REMOTE DEVICE CHECK
Battery Remaining Longevity: 75 mo
Battery Remaining Percentage: 83 %
Battery Voltage: 2.99 V
Brady Statistic RV Percent Paced: 98 %
Date Time Interrogation Session: 20211117060443
HighPow Impedance: 58 Ohm
HighPow Impedance: 58 Ohm
Implantable Lead Implant Date: 20070724
Implantable Lead Implant Date: 20070724
Implantable Lead Location: 753859
Implantable Lead Location: 753860
Implantable Lead Model: 5076
Implantable Lead Model: 7001
Implantable Pulse Generator Implant Date: 20201118
Lead Channel Impedance Value: 310 Ohm
Lead Channel Pacing Threshold Amplitude: 1 V
Lead Channel Pacing Threshold Pulse Width: 0.5 ms
Lead Channel Sensing Intrinsic Amplitude: 12 mV
Lead Channel Setting Pacing Amplitude: 2.5 V
Lead Channel Setting Pacing Pulse Width: 0.5 ms
Lead Channel Setting Sensing Sensitivity: 0.5 mV
Pulse Gen Serial Number: 9887144

## 2020-09-19 NOTE — Progress Notes (Signed)
Remote ICD transmission.   

## 2020-10-08 NOTE — Progress Notes (Signed)
Cardiology Office Note Date:  10/09/2020  Patient ID:  Charles Le, Charles Le 1956-04-12, MRN [id number] PCP:  Iona Hansen, NP  Electrophysiologist:  Dr. Graciela Husbands   Chief Complaint:   annual visit  History of Present Illness: Charles Le is a 64 y.o. male with history of HOCM, permanent AFib, CHB, w/ICD, HTN, anxiety, HLD.  I saw him back in 2019 The patient is feeling "great", no CP, palpitations or cardiac awareness at all.  No rest SOB, no DOE.  He denies any dizziness, no near syncope or syncope.  He sleeps with one pillow, denies amy symptoms of PND or orthopnea.  No bleeding or signs of bleeding with his xarelto.  He follows with his PMD office routinely, just had his atorvastatin increased, otherwise he reports his labs were reported to be "good", no mention of any history of kidney problems or issues. He goes to the gym 6 days a week, an hour or more, mostly weights though he states not so heavy to the point of needing to bear down, or to the point of not being able to finish his reps.  He does have goals of some mild bulk increase.  Not doing to much on the way of treadmill type exercise with b/l hip replacements and some issues with his hamstrings he is limited from an orrthopedic standipont with this, outside of that kind of "work out" he denies any exertional intolerances. He was cleared for a hernia repair.  He last saw Dr. Graciela Husbands Feb 2021, pt asked about tx for erectile dysfunction, Dr. Graciela Husbands mentions that mediations for this are contraindicated in HOCM.  No changes were made.  TODAY He is doing very well Reports he continues to exercise regularly, does not over do it and knows where his stopping point is. NO CP, palpitations No rest SOB, denies DOE, no exertional intolerances No dizzy spells, near syncope or syncope. No bleeding or signs of bleeding   Device History: STJ dual chamber ICD implanted 2007 for HCM, gen change 2014, 09/19/2019 (CRT device, LV port  plugged) History of appropriate therapy: no History of AAD therapy: no DEVICE DEPENDENT Patient is symptomatic with device testing, asks not to make his HR too fast or let it stop    Past Medical History:  Diagnosis Date  . Aflutter    atypical-left sided  . Anxiety    Xanax for panic attacks  . Arthritis    OA AND PAIN RT HIP;  S/P LEFT TOTAL HIP REPLACEMENT  . Atrioventricular block, complete (HCC)    complete  . Automatic implantable cardioverter-defibrillator in situ    GENERATOR CHANGE 2014  . DDD-ICD    [facility] [address] [phone] ) dual chamber ICD implanted May 24, 2006)  . GERD (gastroesophageal reflux disease)   . H/O hiatal hernia   . Heart murmur    since childhood  . Hyperlipidemia   . Hypertr obst cardiomyop    hypertrophic  . Insomnia   . PONV (postoperative nausea and vomiting)    A LITTLE NAUSEA AFTER PREVIOUS HIP REPLACEMENT    Past Surgical History:  Procedure Laterality Date  . CARDIOVERSION  2006  . COLONOSCOPY WITH PROPOFOL N/A 12/08/2016   Procedure: COLONOSCOPY WITH PROPOFOL;  Surgeon: Willis Modena, MD;  Location: [facility];  Service: Endoscopy;  Laterality: N/A;  . HERNIA REPAIR     VENTRAL HERNIA REPAIR 11/22/11  . HIP SURGERY     replacement bilateral  . ICD GENERATOR CHANGEOUT N/A  09/19/2019   Procedure: ICD GENERATOR CHANGEOUT;  Surgeon: Duke Salvia, MD;  Location: [facility] INVASIVE CV LAB;  Service: Cardiovascular;  Laterality: N/A;  . ICD implantation     ICD- St Jude  . IMPLANTABLE CARDIOVERTER DEFIBRILLATOR (ICD) GENERATOR CHANGE N/A 02/21/2013   Procedure: ICD GENERATOR CHANGE;  Surgeon: Duke Salvia, MD;  Location: [facility] CATH LAB;  Service: Cardiovascular;  Laterality: N/A;  . INCISIONAL HERNIA REPAIR N/A 12/26/2017   Procedure: INCISIONAL HERNIA REPAIR WITH MESH;  Surgeon: Abigail Miyamoto, MD;  Location: [facility] OR;  Service: General;  Laterality: N/A;  . INSERTION OF MESH N/A 12/26/2017   Procedure: INSERTION OF MESH;  Surgeon:  Abigail Miyamoto, MD;  Location: [facility] OR;  Service: General;  Laterality: N/A;  . JOINT REPLACEMENT  Oct 07, 2009   LEFT TOTAL HIP ARTHROPLASTY  . TOTAL HIP ARTHROPLASTY Right 12/18/2013   Procedure: RIGHT TOTAL HIP ARTHROPLASTY ANTERIOR APPROACH;  Surgeon: Shelda Pal, MD;  Location: [facility] ORS;  Service: Orthopedics;  Laterality: Right;  . VENTRAL HERNIA REPAIR  11/22/2011   Procedure: HERNIA REPAIR VENTRAL ADULT;  Surgeon: Shelly Rubenstein, MD;  Location: [facility] OR;  Service: General;  Laterality: N/A;  Ventral hernia repair with mesh    Current Outpatient Medications  Medication Sig Dispense Refill  . ALPRAZolam (XANAX) 0.5 MG tablet Take 0.5 mg by mouth 2 (two) times daily as needed for anxiety.     [name] atorvastatin (LIPITOR) 40 MG tablet Take 40 mg by mouth daily.    [name] dexlansoprazole (DEXILANT) 60 MG capsule Take 60 mg by mouth daily.     [name] lisinopril-hydrochlorothiazide (PRINZIDE,ZESTORETIC) 20-12.5 MG tablet TAKE 1 TABLET BY MOUTH DAILY. 90 tablet 2  . Multiple Vitamin (MULTIVITAMIN WITH MINERALS) TABS Take 1 tablet by mouth daily.    . rivaroxaban (XARELTO) 20 MG TABS tablet Take 1 tablet (20 mg total) by mouth daily with supper. 30 tablet 5  . traZODone (DESYREL) 50 MG tablet Take 50 mg by mouth at bedtime as needed for sleep.      No current facility-administered medications for this visit.    Allergies:   Patient has no known allergies.   Social History:  The patient  reports that he has been smoking cigarettes. He has a 9.00 pack-year smoking history. He has never used smokeless tobacco. He reports current alcohol use. He reports that he does not use drugs.   Family History:  The patient's family history includes Cancer in his maternal grandfather and mother; Diabetes in his father and paternal grandmother; Heart attack in his father; Hypertrophic cardiomyopathy in his paternal grandmother.  ROS:  Please see the history of present illness.  All other systems are reviewed and  otherwise negative.   PHYSICAL EXAM:  VS:  BP 114/78   Pulse 71   Ht 5\' 11"  (1.803 m)   Wt 270 lb 9.6 oz (122.7 kg)   SpO2 94%   BMI 37.74 kg/m  BMI: Body mass index is 37.74 kg/m. Well nourished, well developed, in no acute distress  HEENT: normocephalic, atraumatic  Neck: no JVD, carotid bruits or masses Cardiac:  RRR; (paced), I do not appreciate any murmurs,  no rubs, or gallops Lungs:  CTA b/l, no wheezing, rhonchi or rales  Abd: soft, nontender MS: no deformity or atrophy Ext: no edema  Skin: warm and dry, no rash Neuro:  No gross deficits appreciated Psych: euthymic mood, full affect  ICD site is stable, no tethering or discomfort   EKG:  Done  today and reviewed by myself  AFib, V paced  ICD interrogation done today and reviewed by myself:  Battery and lead measurements are good Programmed VVIR No R waves today at 45 No VT, therapies   10/01/15: TTE Study Conclusions - Left ventricle: Severe asymetric septal hypertrophy with abnormal   echogenicity suggesting HOCM or infitration. Patient cannot have   MRI   to assess unless pacer is MRI compatible The cavity size was   normal. Wall thickness was increased in a pattern of severe LVH.   Systolic function was normal. The estimated ejection fraction was   in the range of 50% to 55%. - Mitral valve: Calcified annulus. Mildly thickened leaflets .   There was mild regurgitation. - Left atrium: The atrium was severely dilated. - Atrial septum: No defect or patent foramen ovale was identified.  09/14/11: TTE Study Conclusions - Left ventricle: The cavity size was normal. Wall thickness was increased in a pattern of severe LVH. There was moderate focal basal hypertrophy of the septum. Systolic function was normal. The estimated ejection fraction was in the range of 50% to 55%. Wall motion was normal; there were no regional wall motion abnormalities. Doppler parameters are consistent with restrictive  physiology, indicative of decreased left ventricular diastolic compliance and/or increased left atrial pressure. - Mitral valve: There was moderate systolic anterior motion of the chordal structures. Mild regurgitation. - Left atrium: The atrium was severely dilated. - Right atrium: The atrium was mildly dilated. - Pulmonary arteries: PA peak pressure: 68mm Hg (S). Impressions: - Proximal septal thickening and SAM of the mitral valve create narrow LVOT; Mildly elevated LVOT velocity at rest of2.0 m/s.  Recent Labs: No results found for requested labs within last 8760 hours.  No results found for requested labs within last 8760 hours.   CrCl cannot be calculated (Patient's most recent lab result is older than the maximum 21 days allowed.).   Wt Readings from Last 3 Encounters:  10/09/20 270 lb 9.6 oz (122.7 kg)  12/19/19 254 lb (115.2 kg)  09/19/19 245 lb (111.1 kg)     Other studies reviewed: Additional studies/records reviewed today include: summarized above  ASSESSMENT AND PLAN:  1. ICD, CHB    He is device dependent    Intact function, no programming changes made  2. HOCM     No symptoms     Has been some years for an echo, will update    3. Permanent AFib     CHA2DS2Vasc is remains one, maintained on Xarelto     Labs today  4. HTN     No changes     Disposition: remotyes Q 3 mo, in clinic he requests, 9 mo, sooner if needed   Current medicines are reviewed at length with the patient today.  The patient did not have any concerns regarding medicines.  Norma Fredrickson, PA-C 10/09/2020 3:22 PM     [facility] [address] [place] [address] [phone] (office)  [phone] (fax)

## 2020-10-09 ENCOUNTER — Ambulatory Visit (INDEPENDENT_AMBULATORY_CARE_PROVIDER_SITE_OTHER): Payer: Medicaid Other | Admitting: Physician Assistant

## 2020-10-09 ENCOUNTER — Encounter: Payer: Self-pay | Admitting: Physician Assistant

## 2020-10-09 ENCOUNTER — Other Ambulatory Visit: Payer: Self-pay

## 2020-10-09 VITALS — BP 114/78 | HR 71 | Ht 71.0 in | Wt 270.6 lb

## 2020-10-09 DIAGNOSIS — I10 Essential (primary) hypertension: Secondary | ICD-10-CM

## 2020-10-09 DIAGNOSIS — I4821 Permanent atrial fibrillation: Secondary | ICD-10-CM

## 2020-10-09 DIAGNOSIS — I421 Obstructive hypertrophic cardiomyopathy: Secondary | ICD-10-CM | POA: Diagnosis not present

## 2020-10-09 DIAGNOSIS — Z95810 Presence of automatic (implantable) cardiac defibrillator: Secondary | ICD-10-CM | POA: Diagnosis not present

## 2020-10-09 DIAGNOSIS — Z79899 Other long term (current) drug therapy: Secondary | ICD-10-CM | POA: Diagnosis not present

## 2020-10-09 NOTE — Patient Instructions (Signed)
Medication Instructions:   Your physician recommends that you continue on your current medications as directed. Please refer to the Current Medication list given to you today.  *If you need a refill on your cardiac medications before your next appointment, please call your pharmacy*   Lab Work:  CBC AND BMET   If you have labs (blood work) drawn today and your tests are completely normal, you will receive your results only by: [name] MyChart Message (if you have MyChart) OR . A paper copy in the mail If you have any lab test that is abnormal or we need to change your treatment, we will call you to review the results.   Testing/Procedures: Your physician has requested that you have an echocardiogram. Echocardiography is a painless test that uses sound waves to create images of your heart. It provides your doctor with information about the size and shape of your heart and how well your heart's chambers and valves are working. This procedure takes approximately one hour. There are no restrictions for this procedure.    Follow-Up: At [facility], you and your health needs are our priority.  As part of our continuing mission to provide you with exceptional heart care, we have created designated Provider Care Teams.  These Care Teams include your primary Cardiologist (physician) and Advanced Practice Providers (APPs -  Physician Assistants and Nurse Practitioners) who all work together to provide you with the care you need, when you need it.  We recommend signing up for the patient portal called "MyChart".  Sign up information is provided on this After Visit Summary.  MyChart is used to connect with patients for Virtual Visits (Telemedicine).  Patients are able to view lab/test results, encounter notes, upcoming appointments, etc.  Non-urgent messages can be sent to your provider as well.   To learn more about what you can do with MyChart, go to [email or web address].    Your next appointment:    9 month(s)  The format for your next appointment:   In Person  Provider:   Sherryl Manges, MD   Other Instructions

## 2020-10-10 LAB — CBC
Hematocrit: 51.3 % — ABNORMAL HIGH (ref 37.5–51.0)
Hemoglobin: 17.4 g/dL (ref 13.0–17.7)
MCH: 32.1 pg (ref 26.6–33.0)
MCHC: 33.9 g/dL (ref 31.5–35.7)
MCV: 95 fL (ref 79–97)
Platelets: 268 10*3/uL (ref 150–450)
RBC: 5.42 x10E6/uL (ref 4.14–5.80)
RDW: 12.5 % (ref 11.6–15.4)
WBC: 9.2 10*3/uL (ref 3.4–10.8)

## 2020-10-10 LAB — BASIC METABOLIC PANEL
BUN/Creatinine Ratio: 13 (ref 10–24)
BUN: 16 mg/dL (ref 8–27)
CO2: 21 mmol/L (ref 20–29)
Calcium: 9.9 mg/dL (ref 8.6–10.2)
Chloride: 99 mmol/L (ref 96–106)
Creatinine, Ser: 1.27 mg/dL (ref 0.76–1.27)
GFR calc Af Amer: 70 mL/min/{1.73_m2} (ref >59)
GFR calc non Af Amer: 60 mL/min/{1.73_m2} (ref >59)
Glucose: 98 mg/dL (ref 65–99)
Potassium: 4.6 mmol/L (ref 3.5–5.2)
Sodium: 137 mmol/L (ref 134–144)

## 2020-10-21 ENCOUNTER — Other Ambulatory Visit: Payer: Self-pay

## 2020-10-21 ENCOUNTER — Ambulatory Visit (HOSPITAL_COMMUNITY): Payer: Medicaid Other | Attending: Cardiovascular Disease

## 2020-10-21 DIAGNOSIS — I421 Obstructive hypertrophic cardiomyopathy: Secondary | ICD-10-CM | POA: Insufficient documentation

## 2020-10-21 LAB — ECHOCARDIOGRAM COMPLETE: S' Lateral: 2.7 cm

## 2020-10-21 MED ORDER — PERFLUTREN LIPID MICROSPHERE
1.0000 mL | INTRAVENOUS | Status: AC | PRN
  Administered 2020-10-21: 2 mL via INTRAVENOUS

## 2020-12-17 ENCOUNTER — Ambulatory Visit (INDEPENDENT_AMBULATORY_CARE_PROVIDER_SITE_OTHER): Payer: Medicaid Other

## 2020-12-17 DIAGNOSIS — I421 Obstructive hypertrophic cardiomyopathy: Secondary | ICD-10-CM

## 2020-12-17 LAB — CUP PACEART REMOTE DEVICE CHECK
Battery Remaining Longevity: 74 mo
Battery Remaining Percentage: 80 %
Battery Voltage: 2.98 V
Brady Statistic RV Percent Paced: 98 %
Date Time Interrogation Session: 20220216043255
HighPow Impedance: 59 Ohm
HighPow Impedance: 60 Ohm
Implantable Lead Implant Date: 20070724
Implantable Lead Implant Date: 20070724
Implantable Lead Location: 753859
Implantable Lead Location: 753860
Implantable Lead Model: 5076
Implantable Lead Model: 7001
Implantable Pulse Generator Implant Date: 20201118
Lead Channel Impedance Value: 360 Ohm
Lead Channel Pacing Threshold Amplitude: 1.25 V
Lead Channel Pacing Threshold Pulse Width: 0.5 ms
Lead Channel Sensing Intrinsic Amplitude: 7.4 mV
Lead Channel Setting Pacing Amplitude: 2.5 V
Lead Channel Setting Pacing Pulse Width: 0.5 ms
Lead Channel Setting Sensing Sensitivity: 0.5 mV
Pulse Gen Serial Number: 9887144

## 2020-12-24 NOTE — Progress Notes (Signed)
Remote ICD transmission.   

## 2021-03-18 ENCOUNTER — Ambulatory Visit (INDEPENDENT_AMBULATORY_CARE_PROVIDER_SITE_OTHER): Payer: Medicaid Other

## 2021-03-18 DIAGNOSIS — I442 Atrioventricular block, complete: Secondary | ICD-10-CM

## 2021-03-18 LAB — CUP PACEART REMOTE DEVICE CHECK
Battery Remaining Longevity: 73 mo
Battery Remaining Percentage: 77 %
Battery Voltage: 2.96 V
Brady Statistic RV Percent Paced: 96 %
Date Time Interrogation Session: 20220518054257
HighPow Impedance: 57 Ohm
HighPow Impedance: 57 Ohm
Implantable Lead Implant Date: 20070724
Implantable Lead Implant Date: 20070724
Implantable Lead Location: 753859
Implantable Lead Location: 753860
Implantable Lead Model: 5076
Implantable Lead Model: 7001
Implantable Pulse Generator Implant Date: 20201118
Lead Channel Impedance Value: 430 Ohm
Lead Channel Pacing Threshold Amplitude: 1.25 V
Lead Channel Pacing Threshold Pulse Width: 0.5 ms
Lead Channel Sensing Intrinsic Amplitude: 12 mV
Lead Channel Setting Pacing Amplitude: 2.5 V
Lead Channel Setting Pacing Pulse Width: 0.5 ms
Lead Channel Setting Sensing Sensitivity: 0.5 mV
Pulse Gen Serial Number: 9887144

## 2021-04-10 NOTE — Progress Notes (Signed)
Remote ICD transmission.   

## 2021-06-17 ENCOUNTER — Ambulatory Visit (INDEPENDENT_AMBULATORY_CARE_PROVIDER_SITE_OTHER): Payer: Medicaid Other

## 2021-06-17 DIAGNOSIS — I422 Other hypertrophic cardiomyopathy: Secondary | ICD-10-CM | POA: Diagnosis not present

## 2021-06-17 LAB — CUP PACEART REMOTE DEVICE CHECK
Battery Remaining Longevity: 69 mo
Battery Remaining Percentage: 74 %
Battery Voltage: 2.96 V
Brady Statistic RV Percent Paced: 97 %
Date Time Interrogation Session: 20220817063646
HighPow Impedance: 60 Ohm
HighPow Impedance: 60 Ohm
Implantable Lead Implant Date: 20070724
Implantable Lead Implant Date: 20070724
Implantable Lead Location: 753859
Implantable Lead Location: 753860
Implantable Lead Model: 5076
Implantable Lead Model: 7001
Implantable Pulse Generator Implant Date: 20201118
Lead Channel Impedance Value: 400 Ohm
Lead Channel Pacing Threshold Amplitude: 1.25 V
Lead Channel Pacing Threshold Pulse Width: 0.5 ms
Lead Channel Sensing Intrinsic Amplitude: 12 mV
Lead Channel Setting Pacing Amplitude: 2.5 V
Lead Channel Setting Pacing Pulse Width: 0.5 ms
Lead Channel Setting Sensing Sensitivity: 0.5 mV
Pulse Gen Serial Number: 9887144

## 2021-07-07 NOTE — Progress Notes (Signed)
Remote ICD transmission.   

## 2021-07-14 ENCOUNTER — Ambulatory Visit: Payer: Medicaid Other | Admitting: Internal Medicine

## 2021-07-14 ENCOUNTER — Encounter: Payer: Self-pay | Admitting: Internal Medicine

## 2021-07-14 ENCOUNTER — Other Ambulatory Visit: Payer: Self-pay

## 2021-07-14 VITALS — BP 112/70 | HR 70 | Ht 71.0 in | Wt 265.0 lb

## 2021-07-14 DIAGNOSIS — I4821 Permanent atrial fibrillation: Secondary | ICD-10-CM

## 2021-07-14 DIAGNOSIS — Z95810 Presence of automatic (implantable) cardiac defibrillator: Secondary | ICD-10-CM | POA: Diagnosis not present

## 2021-07-14 DIAGNOSIS — I442 Atrioventricular block, complete: Secondary | ICD-10-CM

## 2021-07-14 DIAGNOSIS — I421 Obstructive hypertrophic cardiomyopathy: Secondary | ICD-10-CM | POA: Diagnosis not present

## 2021-07-14 DIAGNOSIS — Z79899 Other long term (current) drug therapy: Secondary | ICD-10-CM

## 2021-07-14 NOTE — Progress Notes (Signed)
Electrophysiology Office Note   Patient Care Team: Iona Hansen, NP as PCP - General (Nurse Practitioner) Duke Salvia, MD as PCP - Cardiology (Cardiology)   HPI  Charles Le is a 65 y.o. male  With hypertrophic cardiomyopathy complicated by permanent atrial arrhythmias. He also has complete heart block and is status post ICD implantation oringinal 2007, gen change 2014, 2020 for primary prevention.    DATE TEST EF   11/12 Echo   50-55 %   11/16 Echo   50-55 %   12/21 Echo 55-60% +SAM ? aneurysmal apex ? Pacing effect   Date Cr K Hgb  12/21 1.27 4.6 17.4  1/22 1.16 (CE) 4.0 (CE) 16.4 (CE)      Overall he has been feeling good.  He may become short of breath with heavy exertion, but typically he does not feel limited in his daily activities. He does note variable levels of fatigue, worse on some days..  Prior to his hip injury a few months ago, he was exercising 3-4 times a week. Since then he has not been exercising regularly. However, he remains active with mowing his yawn, using the weed-eater, completing his own grocery shopping and caring for his pets.   He endorses snoring, but this is not as severe as it used to be. Normally he goes to bed 5-5:30 in the morning and sleeps until noon. Generally he feels fine when he wakes up.  The patient denies chest pain, nocturnal dyspnea, orthopnea or peripheral edema.  There have been no palpitations, lightheadedness or syncope.     Past Medical History:  Diagnosis Date   Aflutter    atypical-left sided   Anxiety    Xanax for panic attacks   Arthritis    OA AND PAIN RT HIP;  S/P LEFT TOTAL HIP REPLACEMENT   Atrioventricular block, complete (HCC)    complete   Automatic implantable cardioverter-defibrillator in situ    GENERATOR CHANGE 2014   DDD-ICD    [employer] [address] [phone] ) dual chamber ICD implanted May 24, 2006)   GERD (gastroesophageal reflux disease)    H/O hiatal hernia    Heart murmur     since childhood   Hyperlipidemia    Hypertr obst cardiomyop    hypertrophic   Insomnia    PONV (postoperative nausea and vomiting)    A LITTLE NAUSEA AFTER PREVIOUS HIP REPLACEMENT    Past Surgical History:  Procedure Laterality Date   CARDIOVERSION  2006   COLONOSCOPY WITH PROPOFOL N/A 12/08/2016   Procedure: COLONOSCOPY WITH PROPOFOL;  Surgeon: Willis Modena, MD;  Location: [facility];  Service: Endoscopy;  Laterality: N/A;   HERNIA REPAIR     VENTRAL HERNIA REPAIR 11/22/11   HIP SURGERY     replacement bilateral   ICD GENERATOR CHANGEOUT N/A 09/19/2019   Procedure: ICD GENERATOR CHANGEOUT;  Surgeon: Duke Salvia, MD;  Location: [facility] INVASIVE CV LAB;  Service: Cardiovascular;  Laterality: N/A;   ICD implantation     ICD- St Jude   IMPLANTABLE CARDIOVERTER DEFIBRILLATOR (ICD) GENERATOR CHANGE N/A 02/21/2013   Procedure: ICD GENERATOR CHANGE;  Surgeon: Duke Salvia, MD;  Location: [facility] CATH LAB;  Service: Cardiovascular;  Laterality: N/A;   INCISIONAL HERNIA REPAIR N/A 12/26/2017   Procedure: INCISIONAL HERNIA REPAIR WITH MESH;  Surgeon: Abigail Miyamoto, MD;  Location: [facility] OR;  Service: General;  Laterality: N/A;   INSERTION OF MESH N/A 12/26/2017   Procedure: INSERTION OF MESH;  Surgeon: Abigail Miyamoto, MD;  Location: [facility] OR;  Service: General;  Laterality: N/A;   JOINT REPLACEMENT  Oct 07, 2009   LEFT TOTAL HIP ARTHROPLASTY   TOTAL HIP ARTHROPLASTY Right 12/18/2013   Procedure: RIGHT TOTAL HIP ARTHROPLASTY ANTERIOR APPROACH;  Surgeon: Shelda Pal, MD;  Location: [facility] ORS;  Service: Orthopedics;  Laterality: Right;   VENTRAL HERNIA REPAIR  11/22/2011   Procedure: HERNIA REPAIR VENTRAL ADULT;  Surgeon: Shelly Rubenstein, MD;  Location: [facility] OR;  Service: General;  Laterality: N/A;  Ventral hernia repair with mesh    Current Outpatient Medications  Medication Sig Dispense Refill   ALPRAZolam (XANAX) 0.5 MG tablet Take 0.5 mg by mouth 2 (two) times daily as needed for anxiety.       atorvastatin (LIPITOR) 40 MG tablet Take 40 mg by mouth daily.     lisinopril-hydrochlorothiazide (PRINZIDE,ZESTORETIC) 20-12.5 MG tablet TAKE 1 TABLET BY MOUTH DAILY. 90 tablet 2   Multiple Vitamin (MULTIVITAMIN WITH MINERALS) TABS Take 1 tablet by mouth daily.     rivaroxaban (XARELTO) 20 MG TABS tablet Take 1 tablet (20 mg total) by mouth daily with supper. 30 tablet 5   dexlansoprazole (DEXILANT) 60 MG capsule Take 60 mg by mouth daily.  (Patient not taking: Reported on 07/14/2021)     RABEprazole (ACIPHEX) 20 MG tablet Take 20 mg by mouth daily.     traZODone (DESYREL) 50 MG tablet Take 50 mg by mouth at bedtime as needed for sleep.      No current facility-administered medications for this visit.    No Known Allergies  Review of Systems negative except from HPI and PMH  Physical Exam BP 112/70   Pulse 70   Ht 5\' 11"  (1.803 m)   Wt 265 lb (120.2 kg)   SpO2 97%   BMI 36.96 kg/m  Well developed and well nourished in no acute distress HENT normal Neck supple with JVP-flat Clear Device pocket well healed; without hematoma or erythema.  There is no tethering  Regular rate and rhythm, 2/6 murmur Abd-soft with active BS No Clubbing cyanosis no edema Skin-warm and dry A & Oriented  Grossly normal sensory and motor function  ECG atrial fib Vpacing 70 -/22/49   Assessment and  Plan  Hypertrophic cardiomyopathy   Complete heart block  ICD-St. Jude      Hypertension  Device function normal  Minimal symptoms assoc with his HCM  He has been on lisinopril-hctx > 10 yrs for BP, but with his symptoms would think of trying him on diltiazam or verapamil.  HR excursion is relatively blunted but not so symptomatic and prob helpful  Also  have reviewed some of the literature on Drugs for ED and [facility] found that it was relatively frequently used and another report that double dosing ended up assoc with exercise increases in gradients  Will have him follow up with his  PCP for Rx but to avoid tadalafil     I,Mathew Stumpf,acting as a scribe for 04-22-1969, MD.,have documented all relevant documentation on the behalf of Sherryl Manges, MD,as directed by  Sherryl Manges, MD while in the presence of Sherryl Manges, MD.  I, Sherryl Manges, MD, have reviewed all documentation for this visit. The documentation on 07/14/21 for the exam, diagnosis, procedures, and orders are all accurate and complete.

## 2021-07-14 NOTE — Patient Instructions (Signed)
Medication Instructions:  Your physician recommends that you continue on your current medications as directed. Please refer to the Current Medication list given to you today.  *If you need a refill on your cardiac medications before your next appointment, please call your pharmacy*   Lab Work: CBC and BMET today If you have labs (blood work) drawn today and your tests are completely normal, you will receive your results only by: . MyChart Message (if you have MyChart) OR . A paper copy in the mail If you have any lab test that is abnormal or we need to change your treatment, we will call you to review the results.   Testing/Procedures: None ordered.    Follow-Up: At [facility], you and your health needs are our priority.  As part of our continuing mission to provide you with exceptional heart care, we have created designated Provider Care Teams.  These Care Teams include your primary Cardiologist (physician) and Advanced Practice Providers (APPs -  Physician Assistants and Nurse Practitioners) who all work together to provide you with the care you need, when you need it.  We recommend signing up for the patient portal called "MyChart".  Sign up information is provided on this After Visit Summary.  MyChart is used to connect with patients for Virtual Visits (Telemedicine).  Patients are able to view lab/test results, encounter notes, upcoming appointments, etc.  Non-urgent messages can be sent to your provider as well.   To learn more about what you can do with MyChart, go to [email or web address].    Your next appointment:   12 month(s)  The format for your next appointment:   In Person  Provider:   Steven Klein, MD     

## 2021-07-15 LAB — BASIC METABOLIC PANEL
BUN/Creatinine Ratio: 11 (ref 10–24)
BUN: 13 mg/dL (ref 8–27)
CO2: 24 mmol/L (ref 20–29)
Calcium: 10 mg/dL (ref 8.6–10.2)
Chloride: 101 mmol/L (ref 96–106)
Creatinine, Ser: 1.18 mg/dL (ref 0.76–1.27)
Glucose: 108 mg/dL — ABNORMAL HIGH (ref 65–99)
Potassium: 4.5 mmol/L (ref 3.5–5.2)
Sodium: 143 mmol/L (ref 134–144)
eGFR: 69 mL/min/{1.73_m2} (ref >59)

## 2021-07-15 LAB — CBC

## 2021-09-16 ENCOUNTER — Ambulatory Visit (INDEPENDENT_AMBULATORY_CARE_PROVIDER_SITE_OTHER): Payer: Medicaid Other

## 2021-09-16 DIAGNOSIS — I442 Atrioventricular block, complete: Secondary | ICD-10-CM | POA: Diagnosis not present

## 2021-09-16 LAB — CUP PACEART REMOTE DEVICE CHECK
Battery Remaining Longevity: 67 mo
Battery Remaining Percentage: 71 %
Battery Voltage: 2.96 V
Brady Statistic RV Percent Paced: 98 %
Date Time Interrogation Session: 20221116061027
HighPow Impedance: 63 Ohm
HighPow Impedance: 63 Ohm
Implantable Lead Implant Date: 20070724
Implantable Lead Implant Date: 20070724
Implantable Lead Location: 753859
Implantable Lead Location: 753860
Implantable Lead Model: 5076
Implantable Lead Model: 7001
Implantable Pulse Generator Implant Date: 20201118
Lead Channel Impedance Value: 410 Ohm
Lead Channel Pacing Threshold Amplitude: 1 V
Lead Channel Pacing Threshold Pulse Width: 0.5 ms
Lead Channel Sensing Intrinsic Amplitude: 12 mV
Lead Channel Setting Pacing Amplitude: 2.5 V
Lead Channel Setting Pacing Pulse Width: 0.5 ms
Lead Channel Setting Sensing Sensitivity: 0.5 mV
Pulse Gen Serial Number: 9887144

## 2021-09-23 NOTE — Progress Notes (Signed)
Remote ICD transmission.   

## 2021-12-15 ENCOUNTER — Telehealth: Payer: Self-pay | Admitting: *Deleted

## 2021-12-15 NOTE — Telephone Encounter (Signed)
° °  Pre-operative Risk Assessment    Patient Name: Charles Le  DOB: 11-Jan-1956 MRN: [id number]     Request for Surgical Clearance    Procedure:  Dental Extraction - Amount of Teeth to be Pulled:  10 TEETH FOR EXTRACTION WITH ALVEOLOPLASTY  Date of Surgery:  Clearance TBD                                 Surgeon:  DR. Diona Browner, DMD Surgeon's Group or Practice Name:  Diona Browner, DMD Phone number:  [phone] Fax number:  [phone]   Type of Clearance Requested:   - Medical  - Pharmacy:  Hold Rivaroxaban (Xarelto)     Type of Anesthesia:  General    Additional requests/questions:    Jiles Prows   12/15/2021, 1:48 PM

## 2021-12-15 NOTE — Telephone Encounter (Signed)
Pharmacy, can you please comment on how long Xarelto can be held for 10 teeth extractions?  Thank you!

## 2021-12-16 ENCOUNTER — Ambulatory Visit (INDEPENDENT_AMBULATORY_CARE_PROVIDER_SITE_OTHER): Payer: Medicaid Other

## 2021-12-16 DIAGNOSIS — I442 Atrioventricular block, complete: Secondary | ICD-10-CM | POA: Diagnosis not present

## 2021-12-16 LAB — CUP PACEART REMOTE DEVICE CHECK
Battery Remaining Longevity: 64 mo
Battery Remaining Percentage: 68 %
Battery Voltage: 2.96 V
Brady Statistic RV Percent Paced: 98 %
Date Time Interrogation Session: 20230215064047
HighPow Impedance: 62 Ohm
HighPow Impedance: 62 Ohm
Implantable Lead Implant Date: 20070724
Implantable Lead Implant Date: 20070724
Implantable Lead Location: 753859
Implantable Lead Location: 753860
Implantable Lead Model: 5076
Implantable Lead Model: 7001
Implantable Pulse Generator Implant Date: 20201118
Lead Channel Impedance Value: 440 Ohm
Lead Channel Pacing Threshold Amplitude: 1 V
Lead Channel Pacing Threshold Pulse Width: 0.5 ms
Lead Channel Sensing Intrinsic Amplitude: 12 mV
Lead Channel Setting Pacing Amplitude: 2.5 V
Lead Channel Setting Pacing Pulse Width: 0.5 ms
Lead Channel Setting Sensing Sensitivity: 0.5 mV
Pulse Gen Serial Number: 9887144

## 2021-12-16 NOTE — Telephone Encounter (Signed)
Patient with diagnosis of afib on Xarelto for anticoagulation.    Procedure: 10 dental extractions with alveoloplasty Date of procedure: TBD  CHA2DS2-VASc Score = 1  This indicates a 0.6% annual risk of stroke. The patient's score is based upon: CHF History: 0 HTN History: 1 Diabetes History: 0 Stroke History: 0 Vascular Disease History: 0 Age Score: 0 Gender Score: 0  CrCl 57mL/min using adjusted body weight Platelet count 223K  Patient does not require pre-op antibiotics for dental procedure.  Per office protocol, patient can hold Xarelto for 2 days prior to procedure.

## 2021-12-16 NOTE — Telephone Encounter (Addendum)
° °  Name: Charles Le  DOB: [birth year]  MRN: [id number]   Primary Cardiologist: Sherryl Manges, MD  Chart reviewed as part of pre-operative protocol coverage. Patient was contacted 12/16/2021 in reference to pre-operative risk assessment for pending surgery as outlined below.  Charles Le was last seen 07/2021 by Dr. Graciela Husbands, primarily followed for HOCM with permanent atrial arrhythmias complete heart block, prior ICD implantation, with last gen change 09/2019 with CRT device. Per [facility] note 10/2020, patient is device dependent. Last device interrogation 09/2021 was normal. Last echo 2021 with EF 55-60%, changes c/w known hypertrophic cardiomyopathy. Has not required prior ischemic eval in records. RCRI is 0.4% indicating low CV risk but need to ensure no new cardiac sx. Called pt, got VM, LTMCB. Agree with pharmD recommendation that SBE prophylaxis is not indicated.  Addendum: patient returned call. The patient affirms he has been doing well without any new cardiac symptoms. He is able to exceed 4 METS without angina or dyspnea by doing yardwork and exercising at the gym. Therefore, based on ACC/AHA guidelines, the patient would be at acceptable risk for the planned procedure without further cardiovascular testing. The patient was advised that if he develops new symptoms prior to surgery to contact our office to arrange for a follow-up visit, and he verbalized understanding.  Regarding anticoagulation, per pharmD, Per office protocol, patient can hold Xarelto for 2 days prior to procedure.    Will route this bundled recommendation to requesting provider via Epic fax function. Please call with questions.   Laurann Montana, PA-C 12/16/2021, 10:32 AM

## 2021-12-22 NOTE — Progress Notes (Signed)
Remote ICD transmission.   

## 2022-02-15 NOTE — H&P (Signed)
?  Patient: Charles Le  PID: [id number]  DOB: 09-08-1956  SEX: Male  ? ?Patient referred by DDS for extraction remaining top teeth ? ?CC: Painful upper teeth ? ?Past Medical History:  Implanted Defibrillator, Smoker, Obese   ? ?Medications: Alprazolam, trazadone, Dexlansoprazole, Atorvastatin, Lisinopril-HCTZ 20/12.5, Xarelto   ? ?Allergies:     NKDA   ? ?Surgeries:   Heart Surgery, hip replacement surgery, intestional surgery    ? ?Social History       ?Smoking:  1/2 ppd          ?Alcohol:n ?Drug use:n            ?                 ?Exam: BMI 32. Decay upper teeth # 2, 3, 4, 5, 6, 7, 8, 9, 10, 13. Mobile # 10.   No purulence, edema, fluctuance, trismus. Oral cancer screening negative. Pharynx clear. No lymphadenopathy. ? ?Panorex:Decay upper teeth # 2, 3, 4, 5, 6, 7, 8, 9, 10, 13. ? ?Assessment: ASA 3. Non-restorable  teeth # 2, 3, 4, 5, 6, 7, 8, 9, 10, 13.          ? ?Plan: Extraction Teeth #  2, 3, 4, 5, 6, 7, 8, 9, 10, 13.   Hospital Day surgery.                ? ?Rx: n              ? ?Risks and complications explained. Questions answered.  ? ?Georgia Lopes, DMD ? ?

## 2022-02-18 ENCOUNTER — Encounter (HOSPITAL_COMMUNITY): Payer: Self-pay | Admitting: Oral Surgery

## 2022-02-18 ENCOUNTER — Other Ambulatory Visit: Payer: Self-pay

## 2022-02-18 ENCOUNTER — Encounter: Payer: Self-pay | Admitting: Internal Medicine

## 2022-02-18 NOTE — Anesthesia Preprocedure Evaluation (Addendum)
Anesthesia Evaluation  ?Patient identified by MRN, date of birth, ID band ?Patient awake ? ? ? ?Reviewed: ?Allergy & Precautions, NPO status , Patient's Chart, lab work & pertinent test results ? ?History of Anesthesia Complications ?(+) PONV and history of anesthetic complications ? ?Airway ?Mallampati: I ? ?TM Distance: >3 FB ?Neck ROM: Full ? ? ? Dental ? ?(+) Poor Dentition, Dental Advisory Given, Chipped,  ?  ?Pulmonary ?neg pulmonary ROS, Current Smoker and Patient abstained from smoking.,  ?  ?Pulmonary exam normal ?breath sounds clear to auscultation ? ? ? ? ? ? Cardiovascular ?hypertension, Pt. on medications ?Normal cardiovascular exam+ dysrhythmias + pacemaker + Cardiac Defibrillator ? ?Rhythm:Regular Rate:Normal ? ?HOCM ? ?EKG 07/14/21: afib. Vpacing rate 70. ?? ?TTE 10/21/20: ??1. There is severe asymmetric LVH of the septum (up to 2.15 cm)  ?consistent with hypertrophic cardiomyopathy. This appears to be reverse  ?curvature phenotype. The septum appears hypokinetic. Resting gradient ~23  ?mmHG across the LVOT. With valsalva, up to  ?34 mmHG. There is SAM of the mitral valve. The apical segment appears  ?aneurysmal but this may be related to pacing. No LV apical thrombus is  ?seen. Left ventricular ejection fraction, by estimation, is 55 to 60%. The  ?left ventricle has normal function.  ?The left ventricle has no regional wall motion abnormalities. There is  ?severe asymmetric left ventricular hypertrophy of the septal segment. Left  ?ventricular diastolic function could not be evaluated.  ??2. Right ventricular systolic function is normal. The right ventricular  ?size is normal. There is normal pulmonary artery systolic pressure. The  ?estimated right ventricular systolic pressure is Q000111Q mmHg.  ??3. Left atrial size was severely dilated.  ??4. Right atrial size was severely dilated.  ??5. The mitral valve is degenerative. Trivial mitral valve regurgitation.  ?No  evidence of mitral stenosis.  ??6. The aortic valve is tricuspid. Aortic valve regurgitation is not  ?visualized. No aortic stenosis is present.  ??7. The inferior vena cava is dilated in size with >50% respiratory  ?variability, suggesting right atrial pressure of 8 mmHg.   ?  ?Neuro/Psych ?negative neurological ROS ? negative psych ROS  ? GI/Hepatic ?Neg liver ROS, hiatal hernia, GERD  ,  ?Endo/Other  ?negative endocrine ROS ? Renal/GU ?negative Renal ROS  ?negative genitourinary ?  ?Musculoskeletal ? ?(+) Arthritis ,  ? Abdominal ?  ?Peds ? Hematology ? ?(+) Blood dyscrasia (on xarelto), ,   ?Anesthesia Other Findings ? ? Reproductive/Obstetrics ? ?  ? ? ? ? ? ? ? ? ? ? ? ? ? ?  ?  ? ? ? ? ? ? ?Anesthesia Physical ?Anesthesia Plan ? ?ASA: 3 ? ?Anesthesia Plan: General  ? ?Post-op Pain Management: Tylenol PO (pre-op)*  ? ?Induction: Intravenous ? ?PONV Risk Score and Plan: 2 and Midazolam, Dexamethasone and Ondansetron ? ?Airway Management Planned: Nasal ETT ? ?Additional Equipment:  ? ?Intra-op Plan:  ? ?Post-operative Plan: Extubation in OR ? ?Informed Consent: I have reviewed the patients History and Physical, chart, labs and discussed the procedure including the risks, benefits and alternatives for the proposed anesthesia with the patient or authorized representative who has indicated his/her understanding and acceptance.  ? ? ? ?Dental advisory given ? ?Plan Discussed with: CRNA ? ?Anesthesia Plan Comments: ( ?)  ? ? ? ? ?Anesthesia Quick Evaluation ? ?

## 2022-02-18 NOTE — Progress Notes (Signed)
Anesthesia Chart Review: ? ?Follows with cardiology for hx of permanent atrial arrhythmias with CHB, St. Jude ICD with last changeout 09/2019 with CRT device (device dependent), HOCM.  ? ?Clearance per telephone encounter 12/16/21, "Chart reviewed as part of pre-operative protocol coverage. Patient was contacted 12/16/2021 in reference to pre-operative risk assessment for pending surgery as outlined below.  Charles Le was last seen 07/2021 by Dr. Caryl Comes, primarily followed for HOCM with permanent atrial arrhythmias complete heart block, prior ICD implantation, with last gen change 09/2019 with CRT device. Per [name] note 10/2020, patient is device dependent. Last device interrogation 09/2021 was normal. Last echo 2021 with EF 55-60%, changes c/w known hypertrophic cardiomyopathy. Has not required prior ischemic eval in records. RCRI is 0.4% indicating low CV risk but need to ensure no new cardiac sx. Called pt, got VM, LTMCB. Agree with pharmD recommendation that SBE prophylaxis is not indicated. ?  ?Addendum: patient returned call. The patient affirms he has been doing well without any new cardiac symptoms. He is able to exceed 4 METS without angina or dyspnea by doing yardwork and exercising at the gym. Therefore, based on ACC/AHA guidelines, the patient would be at acceptable risk for the planned procedure without further cardiovascular testing. The patient was advised that if he develops new symptoms prior to surgery to contact our office to arrange for a follow-up visit, and he verbalized understanding. ?  ?Regarding anticoagulation, per pharmD, Per office protocol, patient can hold Xarelto for 2 days prior to procedure." ? ?Pt will need DOS labs and eval. ? ?EKG 07/14/21: afib. Vpacing rate 70. ? ?TTE 10/21/20: ? 1. There is severe asymmetric LVH of the septum (up to 2.15 cm)  ?consistent with hypertrophic cardiomyopathy. This appears to be reverse  ?curvature phenotype. The septum appears hypokinetic.  Resting gradient ~23  ?mmHG across the LVOT. With valsalva, up to  ?34 mmHG. There is SAM of the mitral valve. The apical segment appears  ?aneurysmal but this may be related to pacing. No LV apical thrombus is  ?seen. Left ventricular ejection fraction, by estimation, is 55 to 60%. The  ?left ventricle has normal function.  ?The left ventricle has no regional wall motion abnormalities. There is  ?severe asymmetric left ventricular hypertrophy of the septal segment. Left  ?ventricular diastolic function could not be evaluated.  ? 2. Right ventricular systolic function is normal. The right ventricular  ?size is normal. There is normal pulmonary artery systolic pressure. The  ?estimated right ventricular systolic pressure is Q000111Q mmHg.  ? 3. Left atrial size was severely dilated.  ? 4. Right atrial size was severely dilated.  ? 5. The mitral valve is degenerative. Trivial mitral valve regurgitation.  ?No evidence of mitral stenosis.  ? 6. The aortic valve is tricuspid. Aortic valve regurgitation is not  ?visualized. No aortic stenosis is present.  ? 7. The inferior vena cava is dilated in size with >50% respiratory  ?variability, suggesting right atrial pressure of 8 mmHg.  ? ?Comparison(s): No significant change from prior study ? ? ? ?Karoline Caldwell, PA-C ?[facility]/Anesthesiology ?Phone [phone] ?02/18/2022 12:49 PM ? ?

## 2022-02-18 NOTE — Progress Notes (Signed)
PERIOPERATIVE PRESCRIPTION FOR IMPLANTED CARDIAC DEVICE PROGRAMMING ? ?Patient Information: ?Name:  Charles Le  ?DOB:  07-31-1956  ?MRN:  [id number]  ?  ?Planned Procedure:  Dental Extractions/Restorations  ?Surgeon:  Dr. Diona Browner  ?Date of Procedure:  02/19/22  ?Cautery will be used.  ?Position during surgery:  supine ?reclined  ? ?Please send documentation back to:  ?[facility] (Fax # [phone])  ?Device Information: ? ?Clinic EP Physician:  Virl Axe, MD  ? ?Device Type:  Defibrillator ?Armed forces logistics/support/administrative officer #:  St. Jude/[employer]: [phone] ?Pacemaker Dependent?:  Yes.   ?Date of Last Device Check:  12/16/21 Normal Device Function?:  Yes.   ? ?Electrophysiologist's Recommendations: ? ?Have magnet available. ?Provide continuous ECG monitoring when magnet is used or reprogramming is to be performed.  ?Procedure will likely interfere with device function.  Device should be programmed:  Tachy therapies disabled and Asynchronous pacing during procedure and returned to normal programming after procedure ? ?Per Device Clinic Standing Orders, ?Wanda Plump, RN  ?1:14 PM 02/18/2022  ?

## 2022-02-18 NOTE — Progress Notes (Addendum)
Spoke with Charles Le for pre-op call. Charles Le has hx  of A-fib and complete Heart block. Charles Le has an ICD, states he's never had any problems with it. Charles Le's cardiologist is Dr. Caryl Comes, cardiac clearance in chart. Device order request sent to the Device clinic. Charles Le's last dose of Eliquis was 02/16/22 PM dose. Charles Le states he's recently been diagnosed as Pre-diabetic. States his last A1C was 6.0 on 11/26/21. Charles Le states he does not check his blood sugar at home.  ? ?Shower instructions given to Charles Le.  ?

## 2022-02-19 ENCOUNTER — Other Ambulatory Visit: Payer: Self-pay

## 2022-02-19 ENCOUNTER — Encounter (HOSPITAL_COMMUNITY)
Admission: RE | Disposition: A | Discharge status: Home / Self Care | Payer: Self-pay | Source: Ambulatory Visit | Attending: Oral Surgery

## 2022-02-19 ENCOUNTER — Ambulatory Visit (HOSPITAL_COMMUNITY): Payer: Medicaid Other | Admitting: Physician Assistant

## 2022-02-19 ENCOUNTER — Encounter (HOSPITAL_COMMUNITY): Payer: Self-pay | Admitting: Oral Surgery

## 2022-02-19 ENCOUNTER — Ambulatory Visit (HOSPITAL_BASED_OUTPATIENT_CLINIC_OR_DEPARTMENT_OTHER): Payer: Medicaid Other | Admitting: Physician Assistant

## 2022-02-19 ENCOUNTER — Ambulatory Visit (HOSPITAL_COMMUNITY)
Admission: RE | Admit: 2022-02-19 | Discharge: 2022-02-19 | Disposition: A | Discharge status: Home / Self Care | Payer: Medicaid Other | Source: Ambulatory Visit | Attending: Oral Surgery | Admitting: Oral Surgery

## 2022-02-19 DIAGNOSIS — Z6832 Body mass index (BMI) 32.0-32.9, adult: Secondary | ICD-10-CM | POA: Diagnosis not present

## 2022-02-19 DIAGNOSIS — K0889 Other specified disorders of teeth and supporting structures: Secondary | ICD-10-CM | POA: Diagnosis not present

## 2022-02-19 DIAGNOSIS — I10 Essential (primary) hypertension: Secondary | ICD-10-CM | POA: Insufficient documentation

## 2022-02-19 DIAGNOSIS — F17200 Nicotine dependence, unspecified, uncomplicated: Secondary | ICD-10-CM | POA: Insufficient documentation

## 2022-02-19 DIAGNOSIS — K056 Periodontal disease, unspecified: Secondary | ICD-10-CM | POA: Insufficient documentation

## 2022-02-19 DIAGNOSIS — I4891 Unspecified atrial fibrillation: Secondary | ICD-10-CM | POA: Insufficient documentation

## 2022-02-19 DIAGNOSIS — Z79899 Other long term (current) drug therapy: Secondary | ICD-10-CM | POA: Diagnosis not present

## 2022-02-19 DIAGNOSIS — K029 Dental caries, unspecified: Secondary | ICD-10-CM

## 2022-02-19 DIAGNOSIS — E669 Obesity, unspecified: Secondary | ICD-10-CM | POA: Insufficient documentation

## 2022-02-19 DIAGNOSIS — Z95810 Presence of automatic (implantable) cardiac defibrillator: Secondary | ICD-10-CM | POA: Diagnosis not present

## 2022-02-19 HISTORY — DX: Prediabetes: R73.03

## 2022-02-19 HISTORY — PX: TOOTH EXTRACTION: SHX859

## 2022-02-19 HISTORY — DX: Essential (primary) hypertension: I10

## 2022-02-19 LAB — BASIC METABOLIC PANEL
Anion gap: 9 (ref 5–15)
BUN: 12 mg/dL (ref 8–23)
CO2: 25 mmol/L (ref 22–32)
Calcium: 9.6 mg/dL (ref 8.9–10.3)
Chloride: 102 mmol/L (ref 98–111)
Creatinine, Ser: 1.28 mg/dL — ABNORMAL HIGH (ref 0.61–1.24)
GFR, Estimated: >60 mL/min (ref >60)
Glucose, Bld: 104 mg/dL — ABNORMAL HIGH (ref 70–99)
Potassium: 4.1 mmol/L (ref 3.5–5.1)
Sodium: 136 mmol/L (ref 135–145)

## 2022-02-19 LAB — CBC
HCT: 46.8 % (ref 39.0–52.0)
Hemoglobin: 16.3 g/dL (ref 13.0–17.0)
MCH: 32.9 pg (ref 26.0–34.0)
MCHC: 34.8 g/dL (ref 30.0–36.0)
MCV: 94.4 fL (ref 80.0–100.0)
Platelets: 237 10*3/uL (ref 150–400)
RBC: 4.96 MIL/uL (ref 4.22–5.81)
RDW: 12.7 % (ref 11.5–15.5)
WBC: 9.1 10*3/uL (ref 4.0–10.5)
nRBC: 0 % (ref 0.0–0.2)

## 2022-02-19 SURGERY — DENTAL RESTORATION/EXTRACTIONS
Anesthesia: General

## 2022-02-19 MED ORDER — PROPOFOL 10 MG/ML IV BOLUS
INTRAVENOUS | Status: DC | PRN
  Administered 2022-02-19: 150 mg via INTRAVENOUS

## 2022-02-19 MED ORDER — FENTANYL CITRATE (PF) 250 MCG/5ML IJ SOLN
INTRAMUSCULAR | Status: DC | PRN
  Administered 2022-02-19: 100 ug via INTRAVENOUS
  Administered 2022-02-19: 50 ug via INTRAVENOUS
  Administered 2022-02-19: 100 ug via INTRAVENOUS

## 2022-02-19 MED ORDER — ROCURONIUM BROMIDE 10 MG/ML (PF) SYRINGE
PREFILLED_SYRINGE | INTRAVENOUS | Status: AC
  Filled 2022-02-19: qty 10

## 2022-02-19 MED ORDER — ONDANSETRON HCL 4 MG/2ML IJ SOLN
INTRAMUSCULAR | Status: DC | PRN
  Administered 2022-02-19: 4 mg via INTRAVENOUS

## 2022-02-19 MED ORDER — LIDOCAINE 2% (20 MG/ML) 5 ML SYRINGE
INTRAMUSCULAR | Status: DC | PRN
  Administered 2022-02-19: 60 mg via INTRAVENOUS

## 2022-02-19 MED ORDER — ORAL CARE MOUTH RINSE: 15.0000 mL | Freq: Once | OROMUCOSAL | Status: AC

## 2022-02-19 MED ORDER — CHLORHEXIDINE GLUCONATE 0.12 % MT SOLN
15.0000 mL | Freq: Once | OROMUCOSAL | Status: AC
  Administered 2022-02-19: 15 mL via OROMUCOSAL
  Filled 2022-02-19: qty 15

## 2022-02-19 MED ORDER — SUGAMMADEX SODIUM 200 MG/2ML IV SOLN
INTRAVENOUS | Status: DC | PRN
Start: 2022-02-19 — End: 2022-02-19
  Administered 2022-02-19: 400 mg via INTRAVENOUS

## 2022-02-19 MED ORDER — DEXAMETHASONE SODIUM PHOSPHATE 10 MG/ML IJ SOLN
INTRAMUSCULAR | Status: DC | PRN
  Administered 2022-02-19: 10 mg via INTRAVENOUS

## 2022-02-19 MED ORDER — OXYMETAZOLINE HCL 0.05 % NA SOLN
NASAL | Status: DC | PRN
  Administered 2022-02-19 (×3): 2 via NASAL

## 2022-02-19 MED ORDER — LIDOCAINE-EPINEPHRINE 2 %-1:100000 IJ SOLN
INTRAMUSCULAR | Status: AC
  Filled 2022-02-19: qty 1

## 2022-02-19 MED ORDER — LACTATED RINGERS IV SOLN: INTRAVENOUS | Status: DC

## 2022-02-19 MED ORDER — PROPOFOL 10 MG/ML IV BOLUS
INTRAVENOUS | Status: AC
  Filled 2022-02-19: qty 20

## 2022-02-19 MED ORDER — EPHEDRINE SULFATE-NACL 50-0.9 MG/10ML-% IV SOSY
PREFILLED_SYRINGE | INTRAVENOUS | Status: DC | PRN
  Administered 2022-02-19: 5 mg via INTRAVENOUS
  Administered 2022-02-19 (×2): 10 mg via INTRAVENOUS

## 2022-02-19 MED ORDER — ACETAMINOPHEN 500 MG PO TABS
1000.0000 mg | ORAL_TABLET | Freq: Once | ORAL | Status: AC
  Administered 2022-02-19: 1000 mg via ORAL
  Filled 2022-02-19: qty 2

## 2022-02-19 MED ORDER — FENTANYL CITRATE (PF) 100 MCG/2ML IJ SOLN: 25.0000 ug | INTRAMUSCULAR | Status: DC | PRN

## 2022-02-19 MED ORDER — ONDANSETRON HCL 4 MG/2ML IJ SOLN
INTRAMUSCULAR | Status: AC
  Filled 2022-02-19: qty 2

## 2022-02-19 MED ORDER — CEFAZOLIN IN SODIUM CHLORIDE 3-0.9 GM/100ML-% IV SOLN
3.0000 g | INTRAVENOUS | Status: AC
  Administered 2022-02-19: 3 g via INTRAVENOUS
  Filled 2022-02-19: qty 100

## 2022-02-19 MED ORDER — AMOXICILLIN 500 MG PO CAPS: 500.0000 mg | ORAL_CAPSULE | Freq: Three times a day (TID) | ORAL | 0 refills | Status: DC

## 2022-02-19 MED ORDER — OXYCODONE-ACETAMINOPHEN 5-325 MG PO TABS
1.0000 | ORAL_TABLET | ORAL | 0 refills | Status: DC | PRN
Start: 2022-02-19 — End: 2022-10-08

## 2022-02-19 MED ORDER — LIDOCAINE-EPINEPHRINE 2 %-1:100000 IJ SOLN
INTRAMUSCULAR | Status: DC | PRN
  Administered 2022-02-19: 20 mL via INTRADERMAL

## 2022-02-19 MED ORDER — MIDAZOLAM HCL 2 MG/2ML IJ SOLN
INTRAMUSCULAR | Status: AC
  Filled 2022-02-19: qty 2

## 2022-02-19 MED ORDER — OXYMETAZOLINE HCL 0.05 % NA SOLN
NASAL | Status: AC
Start: 2022-02-19 — End: 2022-02-19
  Filled 2022-02-19: qty 30

## 2022-02-19 MED ORDER — DEXAMETHASONE SODIUM PHOSPHATE 10 MG/ML IJ SOLN
INTRAMUSCULAR | Status: AC
  Filled 2022-02-19: qty 1

## 2022-02-19 MED ORDER — PHENYLEPHRINE 80 MCG/ML (10ML) SYRINGE FOR IV PUSH (FOR BLOOD PRESSURE SUPPORT)
PREFILLED_SYRINGE | INTRAVENOUS | Status: DC | PRN
  Administered 2022-02-19: 80 ug via INTRAVENOUS
  Administered 2022-02-19: 160 ug via INTRAVENOUS
  Administered 2022-02-19: 80 ug via INTRAVENOUS
  Administered 2022-02-19: 160 ug via INTRAVENOUS
  Administered 2022-02-19: 80 ug via INTRAVENOUS

## 2022-02-19 MED ORDER — FENTANYL CITRATE (PF) 250 MCG/5ML IJ SOLN
INTRAMUSCULAR | Status: AC
  Filled 2022-02-19: qty 5

## 2022-02-19 MED ORDER — ROCURONIUM BROMIDE 10 MG/ML (PF) SYRINGE
PREFILLED_SYRINGE | INTRAVENOUS | Status: DC | PRN
  Administered 2022-02-19: 100 mg via INTRAVENOUS

## 2022-02-19 MED ORDER — SODIUM CHLORIDE 0.9 % IR SOLN
Status: DC | PRN
  Administered 2022-02-19: 1000 mL

## 2022-02-19 MED ORDER — MIDAZOLAM HCL 2 MG/2ML IJ SOLN
INTRAMUSCULAR | Status: DC | PRN
  Administered 2022-02-19: 2 mg via INTRAVENOUS

## 2022-02-19 SURGICAL SUPPLY — 33 items
BAG COUNTER SPONGE SURGICOUNT (BAG) IMPLANT
BAG SPNG CNTER NS LX DISP (BAG)
BLADE SURG 15 STRL LF DISP TIS (BLADE) ×2 IMPLANT
BLADE SURG 15 STRL SS (BLADE) ×2
BUR CROSS CUT FISSURE 1.6 (BURR) ×3 IMPLANT
BUR EGG ELITE 4.0 (BURR) ×3 IMPLANT
CANISTER SUCT 3000ML PPV (MISCELLANEOUS) ×3 IMPLANT
COVER SURGICAL LIGHT HANDLE (MISCELLANEOUS) ×3 IMPLANT
GAUZE PACKING FOLDED 2  STR (GAUZE/BANDAGES/DRESSINGS) ×2
GAUZE PACKING FOLDED 2 STR (GAUZE/BANDAGES/DRESSINGS) ×2 IMPLANT
GLOVE BIO SURGEON STRL SZ 6.5 (GLOVE) IMPLANT
GLOVE BIO SURGEON STRL SZ7 (GLOVE) ×3 IMPLANT
GLOVE BIO SURGEON STRL SZ8 (GLOVE) ×3 IMPLANT
GOWN STRL REUS W/ TWL LRG LVL3 (GOWN DISPOSABLE) ×2 IMPLANT
GOWN STRL REUS W/ TWL XL LVL3 (GOWN DISPOSABLE) ×2 IMPLANT
GOWN STRL REUS W/TWL LRG LVL3 (GOWN DISPOSABLE) ×2
GOWN STRL REUS W/TWL XL LVL3 (GOWN DISPOSABLE) ×2
IV NS 1000ML (IV SOLUTION) ×2
IV NS 1000ML BAXH (IV SOLUTION) ×2 IMPLANT
KIT BASIN OR (CUSTOM PROCEDURE TRAY) ×3 IMPLANT
KIT TURNOVER KIT B (KITS) ×3 IMPLANT
NDL HYPO 25GX1X1/2 BEV (NEEDLE) ×4 IMPLANT
NEEDLE HYPO 25GX1X1/2 BEV (NEEDLE) ×4 IMPLANT
NS IRRIG 1000ML POUR BTL (IV SOLUTION) ×3 IMPLANT
PAD ARMBOARD 7.5X6 YLW CONV (MISCELLANEOUS) ×3 IMPLANT
SLEEVE IRRIGATION ELITE 7 (MISCELLANEOUS) ×3 IMPLANT
SPONGE SURGIFOAM ABS GEL 12-7 (HEMOSTASIS) IMPLANT
SUT CHROMIC 3 0 PS 2 (SUTURE) ×3 IMPLANT
SYR BULB IRRIG 60ML STRL (SYRINGE) ×3 IMPLANT
SYR CONTROL 10ML LL (SYRINGE) ×3 IMPLANT
TRAY ENT MC OR (CUSTOM PROCEDURE TRAY) ×3 IMPLANT
TUBING IRRIGATION (MISCELLANEOUS) ×3 IMPLANT
YANKAUER SUCT BULB TIP NO VENT (SUCTIONS) ×3 IMPLANT

## 2022-02-19 NOTE — H&P (Signed)
H&P documentation  -History and Physical Reviewed  -Patient has been re-examined  -No change in the plan of care  Charles Le  

## 2022-02-19 NOTE — Anesthesia Postprocedure Evaluation (Signed)
Anesthesia Post Note ? ?Patient: Charles Le ? ?Procedure(s) Performed: DENTAL EXTRACTIONS of TEETH 2,3,4,5,6,7,8,9,10,13 WITH AVEOLOPLASTY ? ?  ? ?Patient location during evaluation: PACU ?Anesthesia Type: General ?Level of consciousness: awake and alert ?Pain management: pain level controlled ?Vital Signs Assessment: post-procedure vital signs reviewed and stable ?Respiratory status: spontaneous breathing, nonlabored ventilation, respiratory function stable and patient connected to nasal cannula oxygen ?Cardiovascular status: blood pressure returned to baseline and stable ?Postop Assessment: no apparent nausea or vomiting ?Anesthetic complications: no ? ? ?No notable events documented. ? ?Last Vitals:  ?Vitals:  ? 02/19/22 0839 02/19/22 0850  ?BP: 117/76 118/77  ?Pulse: 70 75  ?Resp: 11 12  ?Temp:  (!) 36.1 ?C  ?SpO2: 95% 97%  ?  ?Last Pain:  ?Vitals:  ? 02/19/22 0850  ?TempSrc:   ?PainSc: 0-No pain  ? ? ?  ?  ?  ?  ?  ?  ? ?Charles Le ? ? ? ? ?

## 2022-02-19 NOTE — Transfer of Care (Signed)
Immediate Anesthesia Transfer of Care Note ? ?Patient: Charles Le ? ?Procedure(s) Performed: DENTAL EXTRACTIONS of TEETH 2,3,4,5,6,7,8,9,10,13 WITH AVEOLOPLASTY ? ?Patient Location: PACU ? ?Anesthesia Type:General ? ?Level of Consciousness: drowsy and patient cooperative ? ?Airway & Oxygen Therapy: Patient Spontanous Breathing ? ?Post-op Assessment: Report given to RN and Post -op Vital signs reviewed and stable ? ?Post vital signs: Reviewed and stable ? ?Last Vitals:  ?Vitals Value Taken Time  ?BP 132/80 02/19/22 0824  ?Temp 36.7 ?C 02/19/22 0824  ?Pulse 73 02/19/22 0825  ?Resp 16 02/19/22 0825  ?SpO2 95 % 02/19/22 0825  ?Vitals shown include unvalidated device data. ? ?Last Pain:  ?Vitals:  ? 02/19/22 0633  ?TempSrc:   ?PainSc: 0-No pain  ?   ? ?  ? ?Complications: No notable events documented. ?

## 2022-02-19 NOTE — Anesthesia Procedure Notes (Signed)
Procedure Name: Intubation ?Date/Time: 02/19/2022 7:36 AM ?Performed by: Rosiland Oz, CRNA ?Pre-anesthesia Checklist: Patient identified, Emergency Drugs available, Suction available, Patient being monitored and Timeout performed ?Patient Re-evaluated:Patient Re-evaluated prior to induction ?Oxygen Delivery Method: Circle system utilized ?Preoxygenation: Pre-oxygenation with 100% oxygen ?Induction Type: IV induction ?Ventilation: Mask ventilation without difficulty ?Laryngoscope Size: Hyacinth Meeker and 3 ?Grade View: Grade I ?Nasal Tubes: Right, Nasal prep performed and Nasal Sheilah Pigeon ?Tube size: 7.0 mm ?Number of attempts: 1 ?Airway Equipment and Method: Stylet ?Placement Confirmation: ETT inserted through vocal cords under direct vision, positive ETCO2 and breath sounds checked- equal and bilateral ?Tube secured with: Tape ?Dental Injury: Teeth and Oropharynx as per pre-operative assessment  ? ? ? ? ?

## 2022-02-19 NOTE — Op Note (Signed)
02/19/2022 ? ?8:18 AM ? ?PATIENT:  Charles Le  66 y.o. male ? ?PRE-OPERATIVE DIAGNOSIS:  NON RESTORABLE TEETH 2,3,4,5,6,7,8,9,10,13 ? ?POST-OPERATIVE DIAGNOSIS:  SAME ? ?PROCEDURE:  Procedure(s): ?DENTAL EXTRACTIONS of TEETH 2,3,4,5,6,7,8,9,10,13 WITH AVEOLOPLASTY ? ?SURGEON:  Surgeon(s): ?Diona Browner, DMD ? ?ANESTHESIA:   local and general ? ?EBL:  minimal ? ?DRAINS: none  ? ?SPECIMEN:  No Specimen ? ?COUNTS:  YES ? ?PLAN OF CARE: Discharge to home after PACU ? ?PATIENT DISPOSITION:  PACU - hemodynamically stable. ?  ?PROCEDURE DETAILS: ?Dictation # [id number] ? ?Gae Bon, DMD ?02/19/2022 ?8:18 AM ? ? ? ? ? ? ? ? ? ? ? ? ? ? ? ?  ?

## 2022-02-19 NOTE — Op Note (Signed)
NAME: Charles Le, Charles Le. ?MEDICAL RECORD NO: [id number] ?ACCOUNT NO: [id number] ?DATE OF BIRTH: Feb 14, 1956 ?FACILITY: [facility] ?LOCATION: [facility] ?PHYSICIAN: [place] Lopes, DDS ? ?Operative Report  ? ?DATE OF PROCEDURE: 02/19/2022 ? ?PREOPERATIVE DIAGNOSIS:  Nonrestorable teeth secondary to dental caries and periodontal disease numbers 2, 3, 4, 5, 6, 7, 8, 9, 10, 13. ? ?POSTOPERATIVE DIAGNOSIS:  Nonrestorable teeth secondary to dental caries and periodontal disease numbers 2, 3, 4, 5, 6, 7, 8, 9, 10, 13. ? ?PROCEDURE:  Extraction teeth numbers 2, 3, 4, 5, 6, 7, 8, 9, 10 and 13.  Alveoplasty right and left maxilla. ? ?SURGEON:  [place] Lopes, DDS ? ?ANESTHESIA:  General.  Dr. Armond Hang, attending. ? ?DESCRIPTION OF PROCEDURE:  The patient was taken to the operating room and placed on the table in supine position.  General anesthesia was administered and nasal endotracheal tube was placed and secured.  The eyes were protected and the patient was  ?draped for surgery.  Timeout was performed.  The posterior pharynx was suctioned and a throat pack was placed.  2% lidocaine 1:100,000 epinephrine was infiltrated buccally and palatally around the maxillary teeth to be removed.  A bite block was placed  ?on the right side of the mouth, the left side was operated first.  A 15 blade was used to make an incision 1 cm proximal to tooth #13 on the alveolar crest and carried it forward on the edentulous space to tooth #10 and then the incision was carried  ?buccally and palatally around teeth numbers 10, 9, 8, 7 and 6. The Periosteal elevator was used to reflect the periosteum.  The teeth were elevated with a 301 elevator.  Tooth #13 fractured upon attempted removal, necessitating removal of circumferential ? bone around the roots of the tooth and then the root was extracted with the 301 elevator and teeth numbers 10, 9, 8, 7 and 6 were removed with the dental forceps.  Sockets were curetted.  The periosteum was reflected to expose the  alveolar crest, which  ?was irregular in contour and had a buccal undercut in the area of #12 and 11.  Alveoplasty was then performed using the egg bur under irrigation and the Stryker handpiece. Bone file was used to further smooth the area and then the area was irrigated and  ?closed with 3-0 chromic, sweetheart and bite block were repositioned to the other side of the mouth.  A 15 blade used to make an incision around teeth numbers 2, 3, 4 and 5.  The periosteum was reflected and the teeth were elevated.  Teeth numbers 2 and  ?3 fractured upon attempted removal, necessitating removal of bone around the residual roots, of these teeth.  The roots were removed using a root tip pick rongeurs and hemostats and teeth numbers 4, 5 were removed with the dental forceps and the sockets  ?were curetted and the periosteum was reflected to expose the alveolar crest.  Alveoplasty was performed using the egg bur followed by the bone file.  Then, the area was irrigated and closed with 3-0 chromic.  The oral cavity was then irrigated and  ?suctioned.  Additional local anesthesia was administered.  The throat pack was removed.  The patient was left under care of anesthesia for extubation and transport to recovery room with plans for discharge home through day surgery. ? ?ESTIMATED BLOOD LOSS:  Minimum. ? ?COMPLICATIONS:  None. ? ?SPECIMENS:  None. ? ? ?PUS ?D: 02/19/2022 8:21:33 am T: 02/19/2022 2:57:00 pm  ?JOB: [id number]/  [id number]  ?

## 2022-02-20 ENCOUNTER — Encounter (HOSPITAL_COMMUNITY): Payer: Self-pay | Admitting: Oral Surgery

## 2022-03-09 ENCOUNTER — Telehealth: Payer: Self-pay

## 2022-03-09 NOTE — Telephone Encounter (Signed)
Clinical pharmacist to review Xarelto 

## 2022-03-09 NOTE — Telephone Encounter (Signed)
? ?  Pre-operative Risk Assessment  ?  ?Patient Name: Charles Le  ?DOB: 10-26-1956 ?MRN: [id number]  ? ?  ? ?Request for Surgical Clearance   ? ?Procedure:   COLONOSCOPY ? ?Date of Surgery:  Clearance 06/02/22                              ?   ?Surgeon:  NONE INDICATED  ?Surgeon's Group or Practice Name:  [facility] ?Phone number:  [phone] ?Fax number:  [phone] ?  ?Type of Clearance Requested:   ?- Pharmacy:  Hold Rivaroxaban (Xarelto) NEEDS INSTRUCTIONS ?  ?Type of Anesthesia:  Not Indicated ?  ?Additional requests/questions:   ? ?Signed, ?Jacinta Shoe   ?03/09/2022, 4:40 PM  ? ?

## 2022-03-10 NOTE — Telephone Encounter (Signed)
Patient was previously cleared a few months ago for dental procedure.  I left a message for the patient to call back and speak to the on-call preop APP of the day.  As long as he is overall condition has not changed, he should be cleared again for this low risk procedure. ?

## 2022-03-10 NOTE — Telephone Encounter (Signed)
? ?  Name: Charles Le  ?DOB: 28-Sep-1956  ?MRN: [id number]  ? ?Primary Cardiologist: Sherryl Manges, MD ? ?Chart reviewed as part of pre-operative protocol coverage. Patient was contacted 03/10/2022 in reference to pre-operative risk assessment for pending surgery as outlined below.  Charles Le was last seen on 07/14/2021 by Dr. Graciela Husbands.  Since that day, Charles Le has done well without exertional chest pain or worsening shortness of breath. ? ?Therefore, based on ACC/AHA guidelines, the patient would be at acceptable risk for the planned procedure without further cardiovascular testing.  ? ?Patient may hold Xarelto for 1 to 2 days prior to the procedure and restart as soon as possible afterward at the surgeon's discretion. ? ?The patient was advised that if he develops new symptoms prior to surgery to contact our office to arrange for a follow-up visit, and he verbalized understanding. ? ?I will route this recommendation to the requesting party via Epic fax function and remove from pre-op pool. Please call with questions. ? ?Azalee Course, [place] ?03/10/2022, 4:23 PM  ?

## 2022-03-10 NOTE — Telephone Encounter (Signed)
Patient with diagnosis of afib on Xarelto for anticoagulation.   ? ?Procedure: colonoscopy ?Date of procedure: 06/02/22 ? ?CHA2DS2-VASc Score = 1  ?This indicates a 0.6% annual risk of stroke. ?The patient's score is based upon: ?CHF History: 0 ?HTN History: 1 ?Diabetes History: 0 ?Stroke History: 0 ?Vascular Disease History: 0 ?Age Score: 0 ?Gender Score: 0 ?  ?   ?CrCl 45mL/min using adjusted body weight due to obesity ?Platelet count 237K ? ?Per office protocol, patient can hold Xarelto for 1-2 days prior to procedure.   ?

## 2022-03-10 NOTE — Telephone Encounter (Signed)
Pt returning PA's call. Please advise ?

## 2022-03-17 ENCOUNTER — Ambulatory Visit (INDEPENDENT_AMBULATORY_CARE_PROVIDER_SITE_OTHER): Payer: Medicaid Other

## 2022-03-17 DIAGNOSIS — I442 Atrioventricular block, complete: Secondary | ICD-10-CM

## 2022-03-18 LAB — CUP PACEART REMOTE DEVICE CHECK
Battery Remaining Longevity: 62 mo
Battery Remaining Percentage: 65 %
Battery Voltage: 2.95 V
Brady Statistic RV Percent Paced: 98 %
Date Time Interrogation Session: 20230517061004
HighPow Impedance: 62 Ohm
HighPow Impedance: 62 Ohm
Implantable Lead Implant Date: 20070724
Implantable Lead Implant Date: 20070724
Implantable Lead Location: 753859
Implantable Lead Location: 753860
Implantable Lead Model: 5076
Implantable Lead Model: 7001
Implantable Pulse Generator Implant Date: 20201118
Lead Channel Impedance Value: 480 Ohm
Lead Channel Pacing Threshold Amplitude: 1 V
Lead Channel Pacing Threshold Pulse Width: 0.5 ms
Lead Channel Sensing Intrinsic Amplitude: 12 mV
Lead Channel Setting Pacing Amplitude: 2.5 V
Lead Channel Setting Pacing Pulse Width: 0.5 ms
Lead Channel Setting Sensing Sensitivity: 0.5 mV
Pulse Gen Serial Number: 9887144

## 2022-03-30 NOTE — Progress Notes (Signed)
Remote ICD transmission.   

## 2022-06-02 ENCOUNTER — Encounter
Admission: RE | Disposition: A | Discharge status: Home / Self Care | Payer: Self-pay | Source: Home / Self Care | Attending: Internal Medicine

## 2022-06-02 ENCOUNTER — Ambulatory Visit: Payer: Medicaid Other | Admitting: Anesthesiology

## 2022-06-02 ENCOUNTER — Ambulatory Visit
Admission: RE | Admit: 2022-06-02 | Discharge: 2022-06-02 | Disposition: A | Discharge status: Home / Self Care | Payer: Medicaid Other | Attending: Internal Medicine | Admitting: Internal Medicine

## 2022-06-02 DIAGNOSIS — R0600 Dyspnea, unspecified: Secondary | ICD-10-CM | POA: Diagnosis not present

## 2022-06-02 DIAGNOSIS — I509 Heart failure, unspecified: Secondary | ICD-10-CM | POA: Diagnosis not present

## 2022-06-02 DIAGNOSIS — Z86010 Personal history of colonic polyps: Secondary | ICD-10-CM | POA: Insufficient documentation

## 2022-06-02 DIAGNOSIS — E669 Obesity, unspecified: Secondary | ICD-10-CM | POA: Diagnosis not present

## 2022-06-02 DIAGNOSIS — F419 Anxiety disorder, unspecified: Secondary | ICD-10-CM | POA: Insufficient documentation

## 2022-06-02 DIAGNOSIS — Z1211 Encounter for screening for malignant neoplasm of colon: Secondary | ICD-10-CM | POA: Insufficient documentation

## 2022-06-02 DIAGNOSIS — I110 Hypertensive heart disease with heart failure: Secondary | ICD-10-CM | POA: Insufficient documentation

## 2022-06-02 DIAGNOSIS — Z95810 Presence of automatic (implantable) cardiac defibrillator: Secondary | ICD-10-CM | POA: Diagnosis not present

## 2022-06-02 DIAGNOSIS — K219 Gastro-esophageal reflux disease without esophagitis: Secondary | ICD-10-CM | POA: Insufficient documentation

## 2022-06-02 DIAGNOSIS — K5730 Diverticulosis of large intestine without perforation or abscess without bleeding: Secondary | ICD-10-CM | POA: Diagnosis not present

## 2022-06-02 DIAGNOSIS — Z6832 Body mass index (BMI) 32.0-32.9, adult: Secondary | ICD-10-CM | POA: Diagnosis not present

## 2022-06-02 DIAGNOSIS — M1990 Unspecified osteoarthritis, unspecified site: Secondary | ICD-10-CM | POA: Insufficient documentation

## 2022-06-02 HISTORY — PX: COLONOSCOPY: SHX5424

## 2022-06-02 SURGERY — COLONOSCOPY
Anesthesia: General

## 2022-06-02 MED ORDER — SODIUM CHLORIDE 0.9 % IV SOLN: INTRAVENOUS | Status: DC

## 2022-06-02 MED ORDER — PROPOFOL 500 MG/50ML IV EMUL
INTRAVENOUS | Status: DC | PRN
  Administered 2022-06-02: 200 ug/kg/min via INTRAVENOUS

## 2022-06-02 MED ORDER — LIDOCAINE HCL (CARDIAC) PF 100 MG/5ML IV SOSY
PREFILLED_SYRINGE | INTRAVENOUS | Status: DC | PRN
  Administered 2022-06-02: 50 mg via INTRAVENOUS

## 2022-06-02 MED ORDER — PROPOFOL 10 MG/ML IV BOLUS
INTRAVENOUS | Status: DC | PRN
  Administered 2022-06-02: 60 mg via INTRAVENOUS

## 2022-06-02 NOTE — Op Note (Signed)
[facility] Patient Name: Charles Le Procedure Date: 06/02/2022 11:48 AM MRN: [id number] Account #: [id number] Date of Birth: 1956/07/03 Admit Type: Outpatient Age: 66 Room: [facility] ENDO ROOM 2 Gender: Male Note Status: Finalized Instrument Name: Prentice Docker [id number] Procedure:             Colonoscopy Indications:           Surveillance: Personal history of adenomatous polyps                         on last colonoscopy > 5 years ago Providers:             Royce Macadamia K. Shahab Polhamus MD, MD Medicines:             Propofol per Anesthesia Complications:         No immediate complications. Procedure:             Pre-Anesthesia Assessment:                        - The risks and benefits of the procedure and the                         sedation options and risks were discussed with the                         patient. All questions were answered and informed                         consent was obtained.                        - Patient identification and proposed procedure were                         verified prior to the procedure by the nurse. The                         procedure was verified in the procedure room.                        - ASA Grade Assessment: III - A patient with severe                         systemic disease.                        - After reviewing the risks and benefits, the patient                         was deemed in satisfactory condition to undergo the                         procedure.                        After obtaining informed consent, the colonoscope was                         passed under direct vision. Throughout the procedure,  the patient's blood pressure, pulse, and oxygen                         saturations were monitored continuously. The                         Colonoscope was introduced through the anus and                         advanced to the the cecum, identified by appendiceal                          orifice and ileocecal valve. The colonoscopy was                         performed without difficulty. The patient tolerated                         the procedure well. The quality of the bowel                         preparation was adequate. The ileocecal valve,                         appendiceal orifice, and rectum were photographed. Findings:      The perianal and digital rectal examinations were normal. Pertinent       negatives include normal sphincter tone and no palpable rectal lesions.      Many small and large-mouthed diverticula were found in the entire colon.       There was no evidence of diverticular bleeding.      The exam was otherwise without abnormality.      The retroflexed view of the distal rectum and anal verge was normal and       showed no anal or rectal abnormalities. Impression:            - Mild diverticulosis in the entire examined colon.                         There was no evidence of diverticular bleeding.                        - The examination was otherwise normal.                        - The distal rectum and anal verge are normal on                         retroflexion view.                        - No specimens collected. Recommendation:        - Patient has a contact number available for                         emergencies. The signs and symptoms of potential                         delayed complications were discussed with the patient.  Return to normal activities tomorrow. Written                         discharge instructions were provided to the patient.                        - Resume previous diet.                        - Continue present medications.                        - Resume Xarelto (rivaroxaban) at prior dose today.                         Refer to managing physician for further adjustment of                         therapy.                        - Repeat colonoscopy in 10 years for screening                          purposes.                        - Return to GI office PRN.                        - The findings and recommendations were discussed with                         the patient. Procedure Code(s):     --- Professional ---                        O1157, Colorectal cancer screening; colonoscopy on                         individual at high risk Diagnosis Code(s):     --- Professional ---                        K57.30, Diverticulosis of large intestine without                         perforation or abscess without bleeding                        Z86.010, Personal history of colonic polyps CPT copyright 2019 [employer]. All rights reserved. The codes documented in this report are preliminary and upon coder review may  be revised to meet current compliance requirements. Stanton Kidney MD, MD 06/02/2022 12:11:07 PM This report has been signed electronically. Number of Addenda: 0 Note Initiated On: 06/02/2022 11:48 AM Scope Withdrawal Time: 0 hours 5 minutes 48 seconds  Total Procedure Duration: 0 hours 9 minutes 27 seconds  Estimated Blood Loss:  Estimated blood loss: none.      [facility]

## 2022-06-02 NOTE — Transfer of Care (Signed)
Immediate Anesthesia Transfer of Care Note  Patient: Charles Le  Procedure(s) Performed: COLONOSCOPY  Patient Location: PACU and Endoscopy Unit  Anesthesia Type:General  Level of Consciousness: awake and drowsy  Airway & Oxygen Therapy: Patient Spontanous Breathing and Patient connected to nasal cannula oxygen  Post-op Assessment: Report given to RN and Post -op Vital signs reviewed and stable  Post vital signs: Reviewed and stable  Last Vitals:  Vitals Value Taken Time  BP 113/84 06/02/22 1210  Temp    Pulse 70 06/02/22 1211  Resp 15 06/02/22 1211  SpO2 100 % 06/02/22 1211  Vitals shown include unvalidated device data.  Last Pain:  Vitals:   06/02/22 1041  TempSrc: Temporal  PainSc: 0-No pain         Complications: No notable events documented.

## 2022-06-02 NOTE — Anesthesia Postprocedure Evaluation (Signed)
Anesthesia Post Note  Patient: Charles Le  Procedure(s) Performed: COLONOSCOPY  Patient location during evaluation: PACU Anesthesia Type: General Level of consciousness: awake and awake and alert Pain management: pain level controlled Vital Signs Assessment: post-procedure vital signs reviewed and stable Respiratory status: spontaneous breathing and nonlabored ventilation Cardiovascular status: stable Anesthetic complications: no   No notable events documented.   Last Vitals:  Vitals:   06/02/22 1041 06/02/22 1210  BP: (!) 167/84 113/84  Pulse: 74 74  Resp: 20 14  Temp: (!) 36.3 C 36.8 C  SpO2: 98% 100%    Last Pain:  Vitals:   06/02/22 1220  TempSrc:   PainSc: 0-No pain                 VAN STAVEREN,Kristain Hu

## 2022-06-02 NOTE — Interval H&P Note (Signed)
History and Physical Interval Note:  06/02/2022 11:18 AM  Charles Le  has presented today for surgery, with the diagnosis of Hx of adenomatous colonic polyps (Z86.010).  The various methods of treatment have been discussed with the patient and family. After consideration of risks, benefits and other options for treatment, the patient has consented to  Procedure(s): COLONOSCOPY (N/A) as a surgical intervention.  The patient's history has been reviewed, patient examined, no change in status, stable for surgery.  I have reviewed the patient's chart and labs.  Questions were answered to the patient's satisfaction.     [place], [place]

## 2022-06-02 NOTE — Anesthesia Preprocedure Evaluation (Signed)
Anesthesia Evaluation  Patient identified by MRN, date of birth, ID band Patient awake    Reviewed: Allergy & Precautions, NPO status , Patient's Chart, lab work & pertinent test results  Airway Mallampati: II  TM Distance: >3 FB Neck ROM: full    Dental  (+) Edentulous Upper   Pulmonary neg pulmonary ROS, Current Smoker,    Pulmonary exam normal  + decreased breath sounds      Cardiovascular Exercise Tolerance: Poor hypertension, Pt. on medications +CHF and + DOE  negative cardio ROS Normal cardiovascular exam+ dysrhythmias Atrial Fibrillation + Cardiac Defibrillator  Rhythm:Regular     Neuro/Psych Anxiety negative neurological ROS  negative psych ROS   GI/Hepatic negative GI ROS, Neg liver ROS, hiatal hernia, GERD  Medicated,  Endo/Other  negative endocrine ROS  Renal/GU negative Renal ROS  negative genitourinary   Musculoskeletal  (+) Arthritis ,   Abdominal (+) + obese,   Peds negative pediatric ROS (+)  Hematology negative hematology ROS (+)   Anesthesia Other Findings Past Medical History: No date: Aflutter     Comment:  atypical-left sided No date: Anxiety     Comment:  Xanax for panic attacks No date: Arthritis     Comment:  OA AND PAIN RT HIP;  S/P LEFT TOTAL HIP REPLACEMENT No date: Atrioventricular block, complete (HCC)     Comment:  complete No date: Automatic implantable cardioverter-defibrillator in situ     Comment:  GENERATOR CHANGE 2014 No date: DDD-ICD     Comment:  St Jude Medical Atlas DR [id number] ) dual chamber ICD               implanted May 24, 2006) No date: GERD (gastroesophageal reflux disease) No date: H/O hiatal hernia No date: Heart murmur     Comment:  since childhood No date: Hyperlipidemia No date: Hypertension No date: Hypertr obst cardiomyop     Comment:  hypertrophic No date: Insomnia No date: Pre-diabetes  Past Surgical History: 2006: CARDIOVERSION 12/08/2016:  COLONOSCOPY WITH PROPOFOL; N/A     Comment:  Procedure: COLONOSCOPY WITH PROPOFOL;  Surgeon: Willis Modena, MD;  Location: [facility] ENDOSCOPY;  Service: Endoscopy;              Laterality: N/A; No date: HERNIA REPAIR     Comment:  VENTRAL HERNIA REPAIR 11/22/11 No date: HIP SURGERY     Comment:  replacement bilateral 09/19/2019: ICD GENERATOR CHANGEOUT; N/A     Comment:  Procedure: ICD GENERATOR CHANGEOUT;  Surgeon: Duke Salvia, MD;  Location: [facility] INVASIVE CV LAB;  Service:               Cardiovascular;  Laterality: N/A; No date: ICD implantation     Comment:  ICD- St Jude 02/21/2013: IMPLANTABLE CARDIOVERTER DEFIBRILLATOR (ICD) GENERATOR  CHANGE; N/A     Comment:  Procedure: ICD GENERATOR CHANGE;  Surgeon: Duke Salvia, MD;  Location: [facility] CATH LAB;  Service:               Cardiovascular;  Laterality: N/A; 12/26/2017: INCISIONAL HERNIA REPAIR; N/A     Comment:  Procedure: INCISIONAL HERNIA REPAIR WITH MESH;  Surgeon:              Abigail Miyamoto, MD;  Location: [facility]  OR;  Service:               General;  Laterality: N/A; 12/26/2017: INSERTION OF MESH; N/A     Comment:  Procedure: INSERTION OF MESH;  Surgeon: Abigail Miyamoto, MD;  Location: [facility] OR;  Service: General;                Laterality: N/A; Oct 07, 2009: JOINT REPLACEMENT     Comment:  LEFT TOTAL HIP ARTHROPLASTY 02/19/2022: TOOTH EXTRACTION; N/A     Comment:  Procedure: DENTAL EXTRACTIONS of TEETH               2,3,4,5,6,7,8,9,10,13 WITH AVEOLOPLASTY;  Surgeon:               Ocie Doyne, DMD;  Location: [facility] OR;  Service: Oral               Surgery;  Laterality: N/A; 12/18/2013: TOTAL HIP ARTHROPLASTY; Right     Comment:  Procedure: RIGHT TOTAL HIP ARTHROPLASTY ANTERIOR               APPROACH;  Surgeon: Shelda Pal, MD;  Location: [facility]               ORS;  Service: Orthopedics;  Laterality: Right; 11/22/2011: VENTRAL HERNIA REPAIR     Comment:  Procedure: HERNIA REPAIR  VENTRAL ADULT;  Surgeon:               Shelly Rubenstein, MD;  Location: [facility] OR;  Service:               General;  Laterality: N/A;  Ventral hernia repair with               mesh  BMI    Body Mass Index: 32.08 kg/m      Reproductive/Obstetrics negative OB ROS                             Anesthesia Physical Anesthesia Plan  ASA: 4  Anesthesia Plan: General   Post-op Pain Management:    Induction: Intravenous  PONV Risk Score and Plan: Propofol infusion and TIVA  Airway Management Planned: Natural Airway  Additional Equipment:   Intra-op Plan:   Post-operative Plan:   Informed Consent: I have reviewed the patients History and Physical, chart, labs and discussed the procedure including the risks, benefits and alternatives for the proposed anesthesia with the patient or authorized representative who has indicated his/her understanding and acceptance.     Dental Advisory Given  Plan Discussed with: CRNA and Surgeon  Anesthesia Plan Comments:         Anesthesia Quick Evaluation

## 2022-06-02 NOTE — H&P (Signed)
Outpatient short stay form Pre-procedure 06/02/2022 11:17 AM Shanika Levings K. Norma Fredrickson, M.D.  Primary Physician: Zoe Lan, NP  Reason for visit:  Personal history of colon adenoma (2018) [facility]  History of present illness:                            Patient presents for colonoscopy for a personal hx of colon polyps. The patient denies abdominal pain, abnormal weight loss or rectal bleeding.  Patient had ICD in place.    Current Facility-Administered Medications:    0.9 %  sodium chloride infusion, , Intravenous, Continuous, Abner Ardis, Boykin Nearing, MD  Medications Prior to Admission  Medication Sig Dispense Refill Last Dose   ALPRAZolam (XANAX) 0.5 MG tablet Take 0.5 mg by mouth 2 (two) times daily as needed for anxiety.    Past Week   amoxicillin (AMOXIL) 500 MG capsule Take 1 capsule (500 mg total) by mouth 3 (three) times daily. 21 capsule 0 Past Week   atorvastatin (LIPITOR) 40 MG tablet Take 40 mg by mouth daily.   Past Week   dexlansoprazole (DEXILANT) 60 MG capsule Take 60 mg by mouth daily.   Past Week   lisinopril-hydrochlorothiazide (PRINZIDE,ZESTORETIC) 20-12.5 MG tablet TAKE 1 TABLET BY MOUTH DAILY. 90 tablet 2 Past Week   Multiple Vitamin (MULTIVITAMIN WITH MINERALS) TABS Take 1 tablet by mouth daily.   Past Week   oxyCODONE-acetaminophen (PERCOCET) 5-325 MG tablet Take 1 tablet by mouth every 4 (four) hours as needed. 20 tablet 0 Past Week   rivaroxaban (XARELTO) 20 MG TABS tablet Take 1 tablet (20 mg total) by mouth daily with supper. 30 tablet 5 Past Week   traZODone (DESYREL) 50 MG tablet Take 50 mg by mouth at bedtime.   Past Week     No Known Allergies   Past Medical History:  Diagnosis Date   Aflutter    atypical-left sided   Anxiety    Xanax for panic attacks   Arthritis    OA AND PAIN RT HIP;  S/P LEFT TOTAL HIP REPLACEMENT   Atrioventricular block, complete (HCC)    complete   Automatic implantable cardioverter-defibrillator in situ     GENERATOR CHANGE 2014   DDD-ICD    St Jude Medical Atlas DR [phone] ) dual chamber ICD implanted May 24, 2006)   GERD (gastroesophageal reflux disease)    H/O hiatal hernia    Heart murmur    since childhood   Hyperlipidemia    Hypertension    Hypertr obst cardiomyop    hypertrophic   Insomnia    Pre-diabetes     Review of systems:  Otherwise negative.    Physical Exam  Gen: Alert, oriented. Appears stated age.  HEENT: [place]/AT. PERRLA. Lungs: CTA, no wheezes. CV: RR nl S1, S2. Abd: soft, benign, no masses. BS+ Ext: No edema. Pulses 2+    Planned procedures: Proceed with colonoscopy. The patient understands the nature of the planned procedure, indications, risks, alternatives and potential complications including but not limited to bleeding, infection, perforation, damage to internal organs and possible oversedation/side effects from anesthesia. The patient agrees and gives consent to proceed.  Please refer to procedure notes for findings, recommendations and patient disposition/instructions.     Kimberl Vig K. Norma Fredrickson, M.D. Gastroenterology 06/02/2022  11:17 AM

## 2022-06-03 ENCOUNTER — Encounter: Payer: Self-pay | Admitting: Internal Medicine

## 2022-06-16 ENCOUNTER — Ambulatory Visit (INDEPENDENT_AMBULATORY_CARE_PROVIDER_SITE_OTHER): Payer: Medicaid Other

## 2022-06-16 DIAGNOSIS — I442 Atrioventricular block, complete: Secondary | ICD-10-CM

## 2022-06-17 LAB — CUP PACEART REMOTE DEVICE CHECK
Battery Remaining Longevity: 59 mo
Battery Remaining Percentage: 62 %
Battery Voltage: 2.95 V
Brady Statistic RV Percent Paced: 98 %
Date Time Interrogation Session: 20230816064415
HighPow Impedance: 64 Ohm
HighPow Impedance: 64 Ohm
Implantable Lead Implant Date: 20070724
Implantable Lead Implant Date: 20070724
Implantable Lead Location: 753859
Implantable Lead Location: 753860
Implantable Lead Model: 5076
Implantable Lead Model: 7001
Implantable Pulse Generator Implant Date: 20201118
Lead Channel Impedance Value: 530 Ohm
Lead Channel Pacing Threshold Amplitude: 1 V
Lead Channel Pacing Threshold Pulse Width: 0.5 ms
Lead Channel Sensing Intrinsic Amplitude: 12 mV
Lead Channel Setting Pacing Amplitude: 2.5 V
Lead Channel Setting Pacing Pulse Width: 0.5 ms
Lead Channel Setting Sensing Sensitivity: 0.5 mV
Pulse Gen Serial Number: 9887144

## 2022-07-16 NOTE — Progress Notes (Signed)
Remote ICD transmission.   

## 2022-09-15 ENCOUNTER — Ambulatory Visit (INDEPENDENT_AMBULATORY_CARE_PROVIDER_SITE_OTHER): Payer: Medicaid Other

## 2022-09-15 DIAGNOSIS — I442 Atrioventricular block, complete: Secondary | ICD-10-CM

## 2022-09-15 LAB — CUP PACEART REMOTE DEVICE CHECK
Battery Remaining Longevity: 55 mo
Battery Remaining Percentage: 59 %
Battery Voltage: 2.95 V
Brady Statistic RV Percent Paced: 97 %
Date Time Interrogation Session: 20231115041553
HighPow Impedance: 66 Ohm
HighPow Impedance: 67 Ohm
Implantable Lead Connection Status: 753985
Implantable Lead Connection Status: 753985
Implantable Lead Implant Date: 20070724
Implantable Lead Implant Date: 20070724
Implantable Lead Location: 753859
Implantable Lead Location: 753860
Implantable Lead Model: 5076
Implantable Lead Model: 7001
Implantable Pulse Generator Implant Date: 20201118
Lead Channel Impedance Value: 490 Ohm
Lead Channel Pacing Threshold Amplitude: 1 V
Lead Channel Pacing Threshold Pulse Width: 0.5 ms
Lead Channel Sensing Intrinsic Amplitude: 12 mV
Lead Channel Setting Pacing Amplitude: 2.5 V
Lead Channel Setting Pacing Pulse Width: 0.5 ms
Lead Channel Setting Sensing Sensitivity: 0.5 mV
Pulse Gen Serial Number: 9887144

## 2022-09-17 ENCOUNTER — Encounter: Payer: Medicaid Other | Admitting: Physician Assistant

## 2022-10-07 NOTE — Progress Notes (Signed)
Electrophysiology Office Note Date: 10/07/2022  ID:  [name], [name] 1956-06-10, MRN [id number]  PCP: Berkley Harvey, NP Primary Cardiologist: Virl Axe, MD Electrophysiologist: Virl Axe, MD   CC: Routine ICD follow-up  Charles Le is a 66 y.o. male seen today for Virl Axe, MD for routine electrophysiology followup. Since last being seen in our clinic the patient reports doing well overall.  he denies chest pain, palpitations, dyspnea, PND, orthopnea, nausea, vomiting, dizziness, syncope, edema, weight gain, or early satiety.   He has not had ICD shocks.   Device History: StMudlogger ICD implanted 2007, gen change 2014, 09/19/2019 (CRT device, LV port plugged)   Past Medical History:  Diagnosis Date   Aflutter    atypical-left sided   Anxiety    Xanax for panic attacks   Arthritis    OA AND PAIN RT HIP;  S/P LEFT TOTAL HIP REPLACEMENT   Atrioventricular block, complete ([place])    complete   Automatic implantable cardioverter-defibrillator in situ    GENERATOR CHANGE 2014   DDD-ICD    E[place] DR [id number] ) dual chamber ICD implanted May 24, 2006)   GERD (gastroesophageal reflux disease)    H/O hiatal hernia    Heart murmur    since childhood   Hyperlipidemia    Hypertension    Hypertr obst cardiomyop    hypertrophic   Insomnia    Pre-diabetes    Past Surgical History:  Procedure Laterality Date   CARDIOVERSION  2006   COLONOSCOPY N/A 06/02/2022   Procedure: COLONOSCOPY;  Surgeon: [place], Benay Pike, MD;  Location: [facility] ENDOSCOPY;  Service: Gastroenterology;  Laterality: N/A;   COLONOSCOPY WITH PROPOFOL N/A 12/08/2016   Procedure: COLONOSCOPY WITH PROPOFOL;  Surgeon: Arta Silence, MD;  Location: [facility] ENDOSCOPY;  Service: Endoscopy;  Laterality: N/A;   HERNIA REPAIR     VENTRAL HERNIA REPAIR 11/22/11   HIP SURGERY     replacement bilateral   ICD GENERATOR CHANGEOUT N/A 09/19/2019   Procedure: ICD GENERATOR CHANGEOUT;  Surgeon:  Deboraha Sprang, MD;  Location: [place] CV LAB;  Service: Cardiovascular;  Laterality: N/A;   ICD implantation     ICD- St Jude   IMPLANTABLE CARDIOVERTER DEFIBRILLATOR (ICD) GENERATOR CHANGE N/A 02/21/2013   Procedure: ICD GENERATOR CHANGE;  Surgeon: Deboraha Sprang, MD;  Location: [facility] CATH LAB;  Service: Cardiovascular;  Laterality: N/A;   INCISIONAL HERNIA REPAIR N/A 12/26/2017   Procedure: INCISIONAL HERNIA REPAIR WITH MESH;  Surgeon: Coralie Keens, MD;  Location: [place];  Service: General;  Laterality: N/A;   INSERTION OF MESH N/A 12/26/2017   Procedure: INSERTION OF MESH;  Surgeon: Coralie Keens, MD;  Location: [place];  Service: General;  Laterality: N/A;   JOINT REPLACEMENT  Oct 07, 2009   LEFT TOTAL HIP ARTHROPLASTY   TOOTH EXTRACTION N/A 02/19/2022   Procedure: DENTAL EXTRACTIONS of TEETH 2,3,4,5,6,7,8,9,10,13 WITH AVEOLOPLASTY;  Surgeon: Diona Browner, DMD;  Location: [place];  Service: Oral Surgery;  Laterality: N/A;   TOTAL HIP ARTHROPLASTY Right 12/18/2013   Procedure: RIGHT TOTAL HIP ARTHROPLASTY ANTERIOR APPROACH;  Surgeon: Mauri Pole, MD;  Location: [facility] ORS;  Service: Orthopedics;  Laterality: Right;   VENTRAL HERNIA REPAIR  11/22/2011   Procedure: HERNIA REPAIR VENTRAL ADULT;  Surgeon: Harl Bowie, MD;  Location: [place];  Service: General;  Laterality: N/A;  Ventral hernia repair with mesh    Current Outpatient Medications  Medication Sig Dispense Refill  ALPRAZolam (XANAX) 0.5 MG tablet Take 0.5 mg by mouth 2 (two) times daily as needed for anxiety.      amoxicillin (AMOXIL) 500 MG capsule Take 1 capsule (500 mg total) by mouth 3 (three) times daily. 21 capsule 0   atorvastatin (LIPITOR) 40 MG tablet Take 40 mg by mouth daily.     dexlansoprazole (DEXILANT) 60 MG capsule Take 60 mg by mouth daily.     lisinopril-hydrochlorothiazide (PRINZIDE,ZESTORETIC) 20-12.5 MG tablet TAKE 1 TABLET BY MOUTH DAILY. 90 tablet 2   Multiple Vitamin (MULTIVITAMIN WITH MINERALS) TABS  Take 1 tablet by mouth daily.     oxyCODONE-acetaminophen (PERCOCET) 5-325 MG tablet Take 1 tablet by mouth every 4 (four) hours as needed. 20 tablet 0   rivaroxaban (XARELTO) 20 MG TABS tablet Take 1 tablet (20 mg total) by mouth daily with supper. 30 tablet 5   traZODone (DESYREL) 50 MG tablet Take 50 mg by mouth at bedtime.     No current facility-administered medications for this visit.    Allergies:   Patient has no known allergies.   Social History: Social History   Socioeconomic History   Marital status: Single    Spouse name: Not on file   Number of children: Not on file   Years of education: Not on file   Highest education level: Not on file  Occupational History   Not on file  Tobacco Use   Smoking status: Some Days    Packs/day: 0.25    Years: 18.00    Total pack years: 4.50    Types: Cigarettes   Smokeless tobacco: Never   Tobacco comments:    2-3 a day  Vaping Use   Vaping Use: Never used  Substance and Sexual Activity   Alcohol use: Yes    Comment: only on Fridays   Drug use: No   Sexual activity: Not on file  Other Topics Concern   Not on file  Social History Narrative   Not on file   Social Determinants of Health   Financial Resource Strain: Not on file  Food Insecurity: Not on file  Transportation Needs: Not on file  Physical Activity: Not on file  Stress: Not on file  Social Connections: Not on file  Intimate Partner Violence: Not on file    Family History: Family History  Problem Relation Age of Onset   Heart attack Father    Diabetes Father    Cancer Mother        breast   Cancer Maternal Grandfather        prostate   Hypertrophic cardiomyopathy Paternal Grandmother    Diabetes Paternal Grandmother    Hypertension Neg Hx     Review of Systems: All other systems reviewed and are otherwise negative except as noted above.   Physical Exam: There were no vitals filed for this visit.   GEN- The patient is well appearing, alert  and oriented x 3 today.   HEENT: normocephalic, atraumatic; sclera clear, conjunctiva pink; hearing intact; oropharynx clear; neck supple, no JVP Lymph- no cervical lymphadenopathy Lungs- Clear to ausculation bilaterally, normal work of breathing.  No wheezes, rales, rhonchi Heart- Regular  rate and rhythm, no murmurs, rubs or gallops, PMI not laterally displaced GI- soft, non-tender, non-distended, bowel sounds present, no hepatosplenomegaly Extremities- no clubbing or cyanosis. No peripheral edema; DP/PT/radial pulses 2+ bilaterally MS- no significant deformity or atrophy Skin- warm and dry, no rash or lesion; ICD pocket well healed Psych- euthymic mood, full affect Neuro-  strength and sensation are intact  ICD interrogation- reviewed in detail today,  See PACEART report  EKG:  EKG was not performed today.   Recent Labs: 02/19/2022: BUN 12; Creatinine, Ser 1.28; Hemoglobin 16.3; Platelets 237; Potassium 4.1; Sodium 136   Wt Readings from Last 3 Encounters:  06/02/22 230 lb (104.3 kg)  02/19/22 235 lb (106.6 kg)  07/14/21 265 lb (120.2 kg)     Other studies Reviewed: Additional studies/ records that were reviewed today include: Previous EP office notes.   Assessment and Plan:  1.  Chronic systolic dysfunction s/p St. Jude dual chamber ICD  euvolemic today Stable on an appropriate medical regimen Normal ICD function See Pace Art report No changes today  2. HOCM Asymptomatic Echo 10/2020 LVEF 55-60%  3. Permanent AF On xarelto, CHA2DS2VASc  is 1  Current medicines are reviewed at length with the patient today.     Disposition:   Follow up with Dr. Graciela Husbands in 12 months    Signed, Graciella Freer, PA-C  10/07/2022 1:18 PM  [facility] [address] [place] [address] [phone] (office) [phone] (fax)

## 2022-10-08 ENCOUNTER — Ambulatory Visit: Payer: Medicaid Other | Attending: Physician Assistant | Admitting: Student

## 2022-10-08 ENCOUNTER — Encounter: Payer: Self-pay | Admitting: Student

## 2022-10-08 VITALS — BP 110/60 | HR 71 | Ht 71.0 in | Wt 249.0 lb

## 2022-10-08 DIAGNOSIS — I4821 Permanent atrial fibrillation: Secondary | ICD-10-CM

## 2022-10-08 DIAGNOSIS — I421 Obstructive hypertrophic cardiomyopathy: Secondary | ICD-10-CM | POA: Diagnosis not present

## 2022-10-08 DIAGNOSIS — I442 Atrioventricular block, complete: Secondary | ICD-10-CM | POA: Diagnosis not present

## 2022-10-08 LAB — CUP PACEART INCLINIC DEVICE CHECK
Battery Remaining Longevity: 54 mo
Brady Statistic RA Percent Paced: 0 %
Brady Statistic RV Percent Paced: 97 %
Date Time Interrogation Session: 20231208125232
HighPow Impedance: 71.9215
Implantable Lead Connection Status: 753985
Implantable Lead Connection Status: 753985
Implantable Lead Implant Date: 20070724
Implantable Lead Implant Date: 20070724
Implantable Lead Location: 753859
Implantable Lead Location: 753860
Implantable Lead Model: 5076
Implantable Lead Model: 7001
Implantable Pulse Generator Implant Date: 20201118
Lead Channel Impedance Value: 375 Ohm
Lead Channel Impedance Value: 475 Ohm
Lead Channel Pacing Threshold Amplitude: 1 V
Lead Channel Pacing Threshold Amplitude: 1 V
Lead Channel Pacing Threshold Pulse Width: 0.5 ms
Lead Channel Pacing Threshold Pulse Width: 0.5 ms
Lead Channel Sensing Intrinsic Amplitude: 8.5 mV
Lead Channel Setting Pacing Amplitude: 2.5 V
Lead Channel Setting Pacing Pulse Width: 0.5 ms
Lead Channel Setting Sensing Sensitivity: 0.5 mV
Pulse Gen Serial Number: 9887144

## 2022-10-08 NOTE — Progress Notes (Signed)
Remote ICD transmission.   

## 2022-10-08 NOTE — Patient Instructions (Addendum)
Medication Instructions:  Your physician recommends that you continue on your current medications as directed. Please refer to the Current Medication list given to you today.  *If you need a refill on your cardiac medications before your next appointment, please call your pharmacy*  Lab Work: None ordered.  If you have labs (blood work) drawn today and your tests are completely normal, you will receive your results only by: MyChart Message (if you have MyChart) OR A paper copy in the mail If you have any lab test that is abnormal or we need to change your treatment, we will call you to review the results.  Testing/Procedures: None ordered.  Follow-Up: At [facility], you and your health needs are our priority.  As part of our continuing mission to provide you with exceptional heart care, we have created designated Provider Care Teams.  These Care Teams include your primary Cardiologist (physician) and Advanced Practice Providers (APPs -  Physician Assistants and Nurse Practitioners) who all work together to provide you with the care you need, when you need it.  We recommend signing up for the patient portal called "MyChart".  Sign up information is provided on this After Visit Summary.  MyChart is used to connect with patients for Virtual Visits (Telemedicine).  Patients are able to view lab/test results, encounter notes, upcoming appointments, etc.  Non-urgent messages can be sent to your provider as well.   To learn more about what you can do with MyChart, go to [email or web address].    Your next appointment:   Please schedule 1 year follow up with Dr. Graciela Husbands  The format for your next appointment:   In Person  Provider:   Casimiro Needle "Mardelle Matte" Lanna Poche, PA-C  Remote monitoring is used to monitor your ICD from home. This monitoring reduces the number of office visits required to check your device to one time per year. It allows [place] to keep an eye on the functioning of your device to  ensure it is working properly. You are scheduled for a device check from home on 12/15/22. You may send your transmission at any time that day. If you have a wireless device, the transmission will be sent automatically. After your physician reviews your transmission, you will receive a postcard with your next transmission date.  Important Information About [place]

## 2022-12-15 ENCOUNTER — Ambulatory Visit: Payer: Medicaid Other

## 2022-12-15 DIAGNOSIS — I442 Atrioventricular block, complete: Secondary | ICD-10-CM

## 2022-12-16 LAB — CUP PACEART REMOTE DEVICE CHECK
Battery Remaining Longevity: 52 mo
Battery Remaining Percentage: 56 %
Battery Voltage: 2.95 V
Brady Statistic RV Percent Paced: 98 %
Date Time Interrogation Session: 20240214064741
HighPow Impedance: 61 Ohm
HighPow Impedance: 61 Ohm
Implantable Lead Connection Status: 753985
Implantable Lead Connection Status: 753985
Implantable Lead Implant Date: 20070724
Implantable Lead Implant Date: 20070724
Implantable Lead Location: 753859
Implantable Lead Location: 753860
Implantable Lead Model: 5076
Implantable Lead Model: 7001
Implantable Pulse Generator Implant Date: 20201118
Lead Channel Impedance Value: 400 Ohm
Lead Channel Pacing Threshold Amplitude: 1 V
Lead Channel Pacing Threshold Pulse Width: 0.5 ms
Lead Channel Sensing Intrinsic Amplitude: 11.5 mV
Lead Channel Setting Pacing Amplitude: 2.5 V
Lead Channel Setting Pacing Pulse Width: 0.5 ms
Lead Channel Setting Sensing Sensitivity: 0.5 mV
Pulse Gen Serial Number: 9887144

## 2023-01-14 NOTE — Progress Notes (Signed)
Remote ICD transmission.   

## 2023-03-16 ENCOUNTER — Ambulatory Visit (INDEPENDENT_AMBULATORY_CARE_PROVIDER_SITE_OTHER): Payer: Medicaid Other

## 2023-03-16 DIAGNOSIS — I442 Atrioventricular block, complete: Secondary | ICD-10-CM

## 2023-03-17 LAB — CUP PACEART REMOTE DEVICE CHECK
Battery Remaining Longevity: 48 mo
Battery Remaining Percentage: 53 %
Battery Voltage: 2.93 V
Brady Statistic RV Percent Paced: 98 %
Date Time Interrogation Session: 20240515101612
HighPow Impedance: 64 Ohm
HighPow Impedance: 64 Ohm
Implantable Lead Connection Status: 753985
Implantable Lead Connection Status: 753985
Implantable Lead Implant Date: 20070724
Implantable Lead Implant Date: 20070724
Implantable Lead Location: 753859
Implantable Lead Location: 753860
Implantable Lead Model: 5076
Implantable Lead Model: 7001
Implantable Pulse Generator Implant Date: 20201118
Lead Channel Impedance Value: 430 Ohm
Lead Channel Pacing Threshold Amplitude: 1 V
Lead Channel Pacing Threshold Pulse Width: 0.5 ms
Lead Channel Sensing Intrinsic Amplitude: 11 mV
Lead Channel Setting Pacing Amplitude: 2.5 V
Lead Channel Setting Pacing Pulse Width: 0.5 ms
Lead Channel Setting Sensing Sensitivity: 0.5 mV
Pulse Gen Serial Number: 9887144

## 2023-03-31 NOTE — Progress Notes (Signed)
Remote ICD transmission.   

## 2023-06-15 ENCOUNTER — Ambulatory Visit (INDEPENDENT_AMBULATORY_CARE_PROVIDER_SITE_OTHER): Payer: Medicare Other

## 2023-06-15 DIAGNOSIS — I422 Other hypertrophic cardiomyopathy: Secondary | ICD-10-CM | POA: Diagnosis not present

## 2023-06-20 LAB — CUP PACEART REMOTE DEVICE CHECK
Battery Remaining Longevity: 46 mo
Battery Remaining Percentage: 50 %
Battery Voltage: 2.93 V
Brady Statistic RV Percent Paced: 98 %
Date Time Interrogation Session: 20240814044419
HighPow Impedance: 62 Ohm
HighPow Impedance: 62 Ohm
Implantable Lead Connection Status: 753985
Implantable Lead Connection Status: 753985
Implantable Lead Implant Date: 20070724
Implantable Lead Implant Date: 20070724
Implantable Lead Location: 753859
Implantable Lead Location: 753860
Implantable Lead Model: 5076
Implantable Lead Model: 7001
Implantable Pulse Generator Implant Date: 20201118
Lead Channel Impedance Value: 410 Ohm
Lead Channel Pacing Threshold Amplitude: 1 V
Lead Channel Pacing Threshold Pulse Width: 0.5 ms
Lead Channel Sensing Intrinsic Amplitude: 12 mV
Lead Channel Setting Pacing Amplitude: 2.5 V
Lead Channel Setting Pacing Pulse Width: 0.5 ms
Lead Channel Setting Sensing Sensitivity: 0.5 mV
Pulse Gen Serial Number: 9887144

## 2023-06-29 NOTE — Progress Notes (Signed)
Remote ICD transmission.   

## 2023-09-14 ENCOUNTER — Ambulatory Visit (INDEPENDENT_AMBULATORY_CARE_PROVIDER_SITE_OTHER): Payer: Medicare Other

## 2023-09-14 DIAGNOSIS — I422 Other hypertrophic cardiomyopathy: Secondary | ICD-10-CM | POA: Diagnosis not present

## 2023-09-14 LAB — CUP PACEART REMOTE DEVICE CHECK
Battery Remaining Longevity: 43 mo
Battery Remaining Percentage: 48 %
Battery Voltage: 2.93 V
Brady Statistic RV Percent Paced: 98 %
Date Time Interrogation Session: 20241113082008
HighPow Impedance: 61 Ohm
HighPow Impedance: 62 Ohm
Implantable Lead Connection Status: 753985
Implantable Lead Connection Status: 753985
Implantable Lead Implant Date: 20070724
Implantable Lead Implant Date: 20070724
Implantable Lead Location: 753859
Implantable Lead Location: 753860
Implantable Lead Model: 5076
Implantable Lead Model: 7001
Implantable Pulse Generator Implant Date: 20201118
Lead Channel Impedance Value: 430 Ohm
Lead Channel Pacing Threshold Amplitude: 1 V
Lead Channel Pacing Threshold Pulse Width: 0.5 ms
Lead Channel Sensing Intrinsic Amplitude: 12 mV
Lead Channel Setting Pacing Amplitude: 2.5 V
Lead Channel Setting Pacing Pulse Width: 0.5 ms
Lead Channel Setting Sensing Sensitivity: 0.5 mV
Pulse Gen Serial Number: 9887144

## 2023-10-05 NOTE — Progress Notes (Signed)
Remote ICD transmission.   

## 2023-12-06 ENCOUNTER — Ambulatory Visit: Payer: Medicare Other | Attending: Pulmonary Disease | Admitting: Pulmonary Disease

## 2023-12-06 ENCOUNTER — Encounter: Payer: Self-pay | Admitting: Pulmonary Disease

## 2023-12-06 VITALS — BP 110/68 | HR 74 | Ht 71.0 in | Wt 236.0 lb

## 2023-12-06 DIAGNOSIS — I4821 Permanent atrial fibrillation: Secondary | ICD-10-CM | POA: Diagnosis present

## 2023-12-06 DIAGNOSIS — I442 Atrioventricular block, complete: Secondary | ICD-10-CM | POA: Insufficient documentation

## 2023-12-06 DIAGNOSIS — D6869 Other thrombophilia: Secondary | ICD-10-CM | POA: Insufficient documentation

## 2023-12-06 DIAGNOSIS — I422 Other hypertrophic cardiomyopathy: Secondary | ICD-10-CM | POA: Insufficient documentation

## 2023-12-06 LAB — CUP PACEART INCLINIC DEVICE CHECK
Battery Remaining Longevity: 42 mo
Brady Statistic RA Percent Paced: 0 %
Brady Statistic RV Percent Paced: 97 %
Date Time Interrogation Session: 20250204164502
HighPow Impedance: 70.6705
Implantable Lead Connection Status: 753985
Implantable Lead Connection Status: 753985
Implantable Lead Implant Date: 20070724
Implantable Lead Implant Date: 20070724
Implantable Lead Location: 753859
Implantable Lead Location: 753860
Implantable Lead Model: 5076
Implantable Lead Model: 7001
Implantable Pulse Generator Implant Date: 20201118
Lead Channel Impedance Value: 375 Ohm
Lead Channel Impedance Value: 450 Ohm
Lead Channel Pacing Threshold Amplitude: 0.75 V
Lead Channel Pacing Threshold Amplitude: 0.75 V
Lead Channel Pacing Threshold Pulse Width: 0.5 ms
Lead Channel Pacing Threshold Pulse Width: 0.5 ms
Lead Channel Setting Pacing Amplitude: 2.5 V
Lead Channel Setting Pacing Pulse Width: 0.5 ms
Lead Channel Setting Sensing Sensitivity: 0.5 mV
Pulse Gen Serial Number: 9887144

## 2023-12-06 NOTE — Progress Notes (Signed)
 Electrophysiology Office Note:   Date:  12/06/2023  ID:  Charles, Le January 11, 1956, MRN [id number]  Primary Cardiologist: Elspeth Sage, MD Primary Heart Failure: None Electrophysiologist: Elspeth Sage, MD       History of Present Illness:   Charles Le is a 68 y.o. male with h/o permanent AF, CHB s/p ICD, HOCM, HLD, HTN,  seen today for routine electrophysiology followup.   Since last being seen in our clinic the patient reports he has had a lot of social stressors over the last year - his partner moved out of the house after 14 years and he has been anxious about his financial situation.  He worries that the stress will impact his heart.  No device related concerns.   He denies chest pain, palpitations, dyspnea, PND, orthopnea, nausea, vomiting, dizziness, syncope, edema, weight gain, or early satiety.   Review of systems complete and found to be negative unless listed in HPI.   EP Information / Studies Reviewed:    EKG is ordered today. Personal review as below.  EKG Interpretation Date/Time:  Tuesday December 06 2023 15:10:58 EST Ventricular Rate:  70 PR Interval:    QRS Duration:  220 QT Interval:  466 QTC Calculation: 503 R Axis:   -72  Text Interpretation: Atrial fibrillation Ventricular-paced rhythm Confirmed by Aniceto Jarvis ([id number]) on 12/06/2023 4:41:14 PM   ICD Interrogation-  reviewed in detail today,  See PACEART report.  Device History: Abbott Dual Chamber ICD implanted 05/24/2006 for cardiomyopathy Generator change 2014, 09/19/2019 (CRT device, LV port plugged) Pt is device dependent > asks not to have elevated HR or let it stop History of appropriate therapy: No History of AAD therapy: No   Studies:  ECHO 12/20201 > LVEF 55-60%    Arrhythmia / AAD Permanent AF    Risk Assessment/Calculations:    CHA2DS2-VASc Score = 3   This indicates a 3.2% annual risk of stroke. The patient's score is based upon: CHF History: 1 HTN History: 1 Diabetes  History: 0 Stroke History: 0 Vascular Disease History: 0 Age Score: 1 Gender Score: 0             Physical Exam:   VS:  BP 110/68   Pulse 74   Ht 5' 11 (1.803 m)   Wt 236 lb (107 kg)   SpO2 97%   BMI 32.92 kg/m    Wt Readings from Last 3 Encounters:  12/06/23 236 lb (107 kg)  10/08/22 249 lb (112.9 kg)  06/02/22 230 lb (104.3 kg)     GEN: Well nourished, well developed in no acute distress NECK: No JVD; No carotid bruits CARDIAC: Regular rate and rhythm (VP), no murmurs, rubs, gallops RESPIRATORY:  Clear to auscultation without rales, wheezing or rhonchi  ABDOMEN: Soft, non-tender, non-distended EXTREMITIES:  No edema; No deformity   ASSESSMENT AND PLAN:    Chronic Systolic Dysfunction, CHB s/p Abbott dual chamber ICD  HOCM -euvolemic today -Stable on an appropriate medical regimen -Normal ICD function -See Pace Art report -No changes today  Permanent AF  AFL- Left Sided CHA2DS2-VASc 3 -continue anticoagulation for stroke prophylaxis   Secondary Hypercoagulable State  -continue Xarelto  20 mg, dose reviewed  -update labs > CBC, CMP  -he is pending labs next week with his PCP > Santana Molt  HTN -well controlled on lisinopril -hydrochlorothiazide     Disposition:   Follow up with Dr. Sage in 12 months   Signed, Jarvis Aniceto, NP-C, AGACNP-BC [facility] - Electrophysiology  12/06/2023, 4:51  PM

## 2023-12-06 NOTE — Patient Instructions (Signed)
 Medication Instructions:  Your physician recommends that you continue on your current medications as directed. Please refer to the Current Medication list given to you today.  *If you need a refill on your cardiac medications before your next appointment, please call your pharmacy*  Lab Work: None ordered If you have labs (blood work) drawn today and your tests are completely normal, you will receive your results only by: MyChart Message (if you have MyChart) OR A paper copy in the mail If you have any lab test that is abnormal or we need to change your treatment, we will call you to review the results.  Follow-Up: At [facility], you and your health needs are our priority.  As part of our continuing mission to provide you with exceptional heart care, we have created designated Provider Care Teams.  These Care Teams include your primary Cardiologist (physician) and Advanced Practice Providers (APPs -  Physician Assistants and Nurse Practitioners) who all work together to provide you with the care you need, when you need it.  Your next appointment:   1 year(s)  Provider:   Canary Brim, NP

## 2023-12-12 ENCOUNTER — Encounter: Payer: Medicare Other | Admitting: Student

## 2023-12-14 ENCOUNTER — Ambulatory Visit (INDEPENDENT_AMBULATORY_CARE_PROVIDER_SITE_OTHER): Payer: Medicaid Other

## 2023-12-14 DIAGNOSIS — I422 Other hypertrophic cardiomyopathy: Secondary | ICD-10-CM

## 2023-12-14 LAB — CUP PACEART REMOTE DEVICE CHECK
Battery Remaining Longevity: 41 mo
Battery Remaining Percentage: 44 %
Battery Voltage: 2.92 V
Brady Statistic RV Percent Paced: 98 %
Date Time Interrogation Session: 20250212064927
HighPow Impedance: 64 Ohm
HighPow Impedance: 64 Ohm
Implantable Lead Connection Status: 753985
Implantable Lead Connection Status: 753985
Implantable Lead Implant Date: 20070724
Implantable Lead Implant Date: 20070724
Implantable Lead Location: 753859
Implantable Lead Location: 753860
Implantable Lead Model: 5076
Implantable Lead Model: 7001
Implantable Pulse Generator Implant Date: 20201118
Lead Channel Impedance Value: 400 Ohm
Lead Channel Pacing Threshold Amplitude: 0.75 V
Lead Channel Pacing Threshold Pulse Width: 0.5 ms
Lead Channel Sensing Intrinsic Amplitude: 8 mV
Lead Channel Setting Pacing Amplitude: 2.5 V
Lead Channel Setting Pacing Pulse Width: 0.5 ms
Lead Channel Setting Sensing Sensitivity: 0.5 mV
Pulse Gen Serial Number: 9887144

## 2024-01-02 ENCOUNTER — Encounter: Payer: Self-pay | Admitting: Internal Medicine

## 2024-01-18 NOTE — Progress Notes (Signed)
 Remote ICD transmission.

## 2024-01-25 ENCOUNTER — Other Ambulatory Visit: Payer: Self-pay | Admitting: Nurse Practitioner

## 2024-01-25 DIAGNOSIS — Z136 Encounter for screening for cardiovascular disorders: Secondary | ICD-10-CM

## 2024-01-25 DIAGNOSIS — Z87891 Personal history of nicotine dependence: Secondary | ICD-10-CM

## 2024-01-30 ENCOUNTER — Encounter: Payer: Self-pay | Admitting: Nurse Practitioner

## 2024-02-08 ENCOUNTER — Ambulatory Visit
Admission: RE | Admit: 2024-02-08 | Discharge: 2024-02-08 | Disposition: A | Discharge status: Home / Self Care | Source: Ambulatory Visit | Attending: Nurse Practitioner | Admitting: Nurse Practitioner

## 2024-02-08 DIAGNOSIS — Z87891 Personal history of nicotine dependence: Secondary | ICD-10-CM

## 2024-02-08 DIAGNOSIS — Z136 Encounter for screening for cardiovascular disorders: Secondary | ICD-10-CM

## 2024-03-14 ENCOUNTER — Ambulatory Visit (INDEPENDENT_AMBULATORY_CARE_PROVIDER_SITE_OTHER): Payer: Medicaid Other

## 2024-03-14 DIAGNOSIS — I422 Other hypertrophic cardiomyopathy: Secondary | ICD-10-CM

## 2024-03-14 LAB — CUP PACEART REMOTE DEVICE CHECK
Battery Remaining Longevity: 38 mo
Battery Remaining Percentage: 41 %
Battery Voltage: 2.92 V
Brady Statistic RV Percent Paced: 99 %
Date Time Interrogation Session: 20250514100952
HighPow Impedance: 64 Ohm
HighPow Impedance: 64 Ohm
Implantable Lead Connection Status: 753985
Implantable Lead Connection Status: 753985
Implantable Lead Implant Date: 20070724
Implantable Lead Implant Date: 20070724
Implantable Lead Location: 753859
Implantable Lead Location: 753860
Implantable Lead Model: 5076
Implantable Lead Model: 7001
Implantable Pulse Generator Implant Date: 20201118
Lead Channel Impedance Value: 440 Ohm
Lead Channel Pacing Threshold Amplitude: 0.75 V
Lead Channel Pacing Threshold Pulse Width: 0.5 ms
Lead Channel Sensing Intrinsic Amplitude: 10.1 mV
Lead Channel Setting Pacing Amplitude: 2.5 V
Lead Channel Setting Pacing Pulse Width: 0.5 ms
Lead Channel Setting Sensing Sensitivity: 0.5 mV
Pulse Gen Serial Number: 9887144

## 2024-03-18 ENCOUNTER — Ambulatory Visit: Payer: Self-pay | Admitting: Cardiology

## 2024-04-24 NOTE — Progress Notes (Signed)
 Remote ICD transmission.

## 2024-06-13 ENCOUNTER — Ambulatory Visit (INDEPENDENT_AMBULATORY_CARE_PROVIDER_SITE_OTHER): Payer: Medicaid Other

## 2024-06-13 DIAGNOSIS — I422 Other hypertrophic cardiomyopathy: Secondary | ICD-10-CM

## 2024-06-14 ENCOUNTER — Ambulatory Visit: Payer: Self-pay | Admitting: Cardiology

## 2024-06-14 LAB — CUP PACEART REMOTE DEVICE CHECK
Battery Remaining Longevity: 35 mo
Battery Remaining Percentage: 39 %
Battery Voltage: 2.92 V
Brady Statistic RV Percent Paced: 99 %
Date Time Interrogation Session: 20250813084015
HighPow Impedance: 64 Ohm
HighPow Impedance: 64 Ohm
Implantable Lead Connection Status: 753985
Implantable Lead Connection Status: 753985
Implantable Lead Implant Date: 20070724
Implantable Lead Implant Date: 20070724
Implantable Lead Location: 753859
Implantable Lead Location: 753860
Implantable Lead Model: 5076
Implantable Lead Model: 7001
Implantable Pulse Generator Implant Date: 20201118
Lead Channel Impedance Value: 410 Ohm
Lead Channel Pacing Threshold Amplitude: 0.75 V
Lead Channel Pacing Threshold Pulse Width: 0.5 ms
Lead Channel Sensing Intrinsic Amplitude: 7.3 mV
Lead Channel Setting Pacing Amplitude: 2.5 V
Lead Channel Setting Pacing Pulse Width: 0.5 ms
Lead Channel Setting Sensing Sensitivity: 0.5 mV
Pulse Gen Serial Number: 9887144

## 2024-07-26 NOTE — Progress Notes (Signed)
Remote ICD Transmission.

## 2024-09-12 ENCOUNTER — Ambulatory Visit (INDEPENDENT_AMBULATORY_CARE_PROVIDER_SITE_OTHER): Payer: Medicaid Other

## 2024-09-12 DIAGNOSIS — I422 Other hypertrophic cardiomyopathy: Secondary | ICD-10-CM

## 2024-09-13 LAB — CUP PACEART REMOTE DEVICE CHECK
Battery Remaining Longevity: 34 mo
Battery Remaining Percentage: 36 %
Battery Voltage: 2.9 V
Brady Statistic RV Percent Paced: 99 %
Date Time Interrogation Session: 20251112101134
HighPow Impedance: 64 Ohm
HighPow Impedance: 64 Ohm
Implantable Lead Connection Status: 753985
Implantable Lead Connection Status: 753985
Implantable Lead Implant Date: 20070724
Implantable Lead Implant Date: 20070724
Implantable Lead Location: 753859
Implantable Lead Location: 753860
Implantable Lead Model: 5076
Implantable Lead Model: 7001
Implantable Pulse Generator Implant Date: 20201118
Lead Channel Impedance Value: 440 Ohm
Lead Channel Pacing Threshold Amplitude: 0.75 V
Lead Channel Pacing Threshold Pulse Width: 0.5 ms
Lead Channel Sensing Intrinsic Amplitude: 12 mV
Lead Channel Setting Pacing Amplitude: 2.5 V
Lead Channel Setting Pacing Pulse Width: 0.5 ms
Lead Channel Setting Sensing Sensitivity: 0.5 mV
Pulse Gen Serial Number: 9887144

## 2024-09-16 ENCOUNTER — Ambulatory Visit: Payer: Self-pay | Admitting: Cardiology

## 2024-09-17 NOTE — Progress Notes (Signed)
 Remote ICD Transmission

## 2024-12-12 ENCOUNTER — Ambulatory Visit: Payer: Medicaid Other

## 2025-03-13 ENCOUNTER — Ambulatory Visit: Payer: Medicare (Managed Care)

## 2025-06-12 ENCOUNTER — Ambulatory Visit: Payer: Medicare (Managed Care)

## 2025-09-11 ENCOUNTER — Ambulatory Visit: Payer: Medicare (Managed Care)

## 2025-12-11 ENCOUNTER — Ambulatory Visit: Payer: Medicare (Managed Care)

## 2026-03-12 ENCOUNTER — Ambulatory Visit: Payer: Medicare (Managed Care)
